# Patient Record
Sex: Female | Born: 1945
Health system: Southern US, Community
[De-identification: ages and names within clinical notes are randomized; demographics above are authoritative.]

## PROBLEM LIST (undated history)

## (undated) DIAGNOSIS — M25519 Pain in unspecified shoulder: Secondary | ICD-10-CM

## (undated) DIAGNOSIS — Z7722 Contact with and (suspected) exposure to environmental tobacco smoke (acute) (chronic): Secondary | ICD-10-CM

## (undated) DIAGNOSIS — I10 Essential (primary) hypertension: Secondary | ICD-10-CM

## (undated) DIAGNOSIS — R809 Proteinuria, unspecified: Secondary | ICD-10-CM

## (undated) DIAGNOSIS — E785 Hyperlipidemia, unspecified: Secondary | ICD-10-CM

## (undated) DIAGNOSIS — R42 Dizziness and giddiness: Secondary | ICD-10-CM

## (undated) DIAGNOSIS — E876 Hypokalemia: Secondary | ICD-10-CM

## (undated) DIAGNOSIS — D649 Anemia, unspecified: Secondary | ICD-10-CM

## (undated) HISTORY — DX: Anemia, unspecified: D64.9

## (undated) HISTORY — DX: Dizziness and giddiness: R42

## (undated) HISTORY — DX: Proteinuria, unspecified: R80.9

## (undated) HISTORY — DX: Contact with and (suspected) exposure to environmental tobacco smoke (acute) (chronic): Z77.22

## (undated) HISTORY — DX: Hypercalcemia: E83.52

## (undated) HISTORY — DX: Essential (primary) hypertension: I10

## (undated) HISTORY — DX: Hyperlipidemia, unspecified: E78.5

## (undated) HISTORY — DX: Hypokalemia: E87.6

## (undated) HISTORY — DX: Pain in unspecified shoulder: M25.519

---

## 1998-04-29 ENCOUNTER — Encounter: Admission: RE | Admit: 1998-04-29 | Discharge: 1998-04-29 | Payer: Self-pay | Admitting: Internal Medicine

## 1998-05-14 ENCOUNTER — Encounter: Admission: RE | Admit: 1998-05-14 | Discharge: 1998-05-14 | Payer: Self-pay | Admitting: Internal Medicine

## 1998-08-16 ENCOUNTER — Ambulatory Visit (HOSPITAL_COMMUNITY): Admission: RE | Admit: 1998-08-16 | Discharge: 1998-08-16 | Payer: Self-pay | Admitting: Internal Medicine

## 1998-08-16 ENCOUNTER — Encounter: Admission: RE | Admit: 1998-08-16 | Discharge: 1998-08-16 | Payer: Self-pay | Admitting: Internal Medicine

## 1998-09-18 ENCOUNTER — Ambulatory Visit (HOSPITAL_COMMUNITY): Admission: RE | Admit: 1998-09-18 | Discharge: 1998-09-18 | Payer: Self-pay | Admitting: Internal Medicine

## 1998-09-24 ENCOUNTER — Encounter: Payer: Self-pay | Admitting: Internal Medicine

## 1998-09-24 ENCOUNTER — Ambulatory Visit: Admission: RE | Admit: 1998-09-24 | Discharge: 1998-09-24 | Payer: Self-pay | Admitting: Internal Medicine

## 1998-10-15 ENCOUNTER — Encounter: Admission: RE | Admit: 1998-10-15 | Discharge: 1998-10-15 | Payer: Self-pay | Admitting: Internal Medicine

## 1999-07-01 ENCOUNTER — Encounter: Admission: RE | Admit: 1999-07-01 | Discharge: 1999-07-01 | Payer: Self-pay | Admitting: Internal Medicine

## 1999-10-10 ENCOUNTER — Emergency Department (HOSPITAL_COMMUNITY): Admission: EM | Admit: 1999-10-10 | Discharge: 1999-10-10 | Payer: Self-pay | Admitting: Emergency Medicine

## 1999-11-06 ENCOUNTER — Encounter: Admission: RE | Admit: 1999-11-06 | Discharge: 1999-11-06 | Payer: Self-pay | Admitting: Internal Medicine

## 1999-11-06 ENCOUNTER — Encounter: Payer: Self-pay | Admitting: Internal Medicine

## 2000-03-02 ENCOUNTER — Encounter: Admission: RE | Admit: 2000-03-02 | Discharge: 2000-03-02 | Payer: Self-pay | Admitting: Internal Medicine

## 2000-09-07 ENCOUNTER — Encounter: Admission: RE | Admit: 2000-09-07 | Discharge: 2000-09-07 | Payer: Self-pay | Admitting: Internal Medicine

## 2000-11-15 ENCOUNTER — Ambulatory Visit (HOSPITAL_COMMUNITY): Admission: RE | Admit: 2000-11-15 | Discharge: 2000-11-15 | Payer: Self-pay | Admitting: Internal Medicine

## 2000-11-15 ENCOUNTER — Encounter: Payer: Self-pay | Admitting: Internal Medicine

## 2001-05-17 ENCOUNTER — Encounter: Admission: RE | Admit: 2001-05-17 | Discharge: 2001-05-17 | Payer: Self-pay | Admitting: Internal Medicine

## 2002-07-25 ENCOUNTER — Encounter: Admission: RE | Admit: 2002-07-25 | Discharge: 2002-07-25 | Payer: Self-pay | Admitting: Internal Medicine

## 2002-08-02 ENCOUNTER — Encounter: Payer: Self-pay | Admitting: Internal Medicine

## 2002-08-02 ENCOUNTER — Encounter: Admission: RE | Admit: 2002-08-02 | Discharge: 2002-08-02 | Payer: Self-pay | Admitting: Internal Medicine

## 2003-01-30 ENCOUNTER — Encounter: Admission: RE | Admit: 2003-01-30 | Discharge: 2003-01-30 | Payer: Self-pay | Admitting: Internal Medicine

## 2003-01-31 ENCOUNTER — Encounter: Admission: RE | Admit: 2003-01-31 | Discharge: 2003-01-31 | Payer: Self-pay | Admitting: Internal Medicine

## 2004-04-29 ENCOUNTER — Encounter: Admission: RE | Admit: 2004-04-29 | Discharge: 2004-04-29 | Payer: Self-pay | Admitting: Internal Medicine

## 2004-08-12 ENCOUNTER — Ambulatory Visit: Payer: Self-pay | Admitting: Internal Medicine

## 2004-08-18 ENCOUNTER — Encounter: Admission: RE | Admit: 2004-08-18 | Discharge: 2004-08-18 | Payer: Self-pay | Admitting: Internal Medicine

## 2004-08-19 ENCOUNTER — Ambulatory Visit: Payer: Self-pay | Admitting: Internal Medicine

## 2004-09-02 ENCOUNTER — Ambulatory Visit: Payer: Self-pay | Admitting: Internal Medicine

## 2004-09-11 ENCOUNTER — Ambulatory Visit: Payer: Self-pay | Admitting: Internal Medicine

## 2004-12-20 ENCOUNTER — Emergency Department (HOSPITAL_COMMUNITY): Admission: EM | Admit: 2004-12-20 | Discharge: 2004-12-20 | Payer: Self-pay | Admitting: Emergency Medicine

## 2005-02-26 ENCOUNTER — Ambulatory Visit: Payer: Self-pay | Admitting: Internal Medicine

## 2005-05-05 ENCOUNTER — Ambulatory Visit: Payer: Self-pay | Admitting: Internal Medicine

## 2005-12-01 ENCOUNTER — Ambulatory Visit: Payer: Self-pay | Admitting: Internal Medicine

## 2005-12-18 ENCOUNTER — Encounter: Admission: RE | Admit: 2005-12-18 | Discharge: 2005-12-18 | Payer: Self-pay | Admitting: Sports Medicine

## 2005-12-18 ENCOUNTER — Encounter (INDEPENDENT_AMBULATORY_CARE_PROVIDER_SITE_OTHER): Payer: Self-pay | Admitting: Internal Medicine

## 2005-12-23 ENCOUNTER — Ambulatory Visit: Payer: Self-pay | Admitting: Internal Medicine

## 2006-06-11 ENCOUNTER — Emergency Department (HOSPITAL_COMMUNITY): Admission: EM | Admit: 2006-06-11 | Discharge: 2006-06-11 | Payer: Self-pay | Admitting: Emergency Medicine

## 2006-11-02 ENCOUNTER — Encounter (INDEPENDENT_AMBULATORY_CARE_PROVIDER_SITE_OTHER): Payer: Self-pay | Admitting: Internal Medicine

## 2006-11-02 ENCOUNTER — Ambulatory Visit: Payer: Self-pay | Admitting: Internal Medicine

## 2006-11-02 LAB — CONVERTED CEMR LAB
Chloride: 101 meq/L (ref 96–112)
Cholesterol: 204 mg/dL — ABNORMAL HIGH (ref 0–200)
HDL: 61 mg/dL (ref >39)
Hemoglobin: 11.5 g/dL — ABNORMAL LOW (ref 11.7–14.8)
MCHC: 31.3 g/dL — ABNORMAL LOW (ref 33.1–35.4)
MCV: 85.2 fL (ref 78.8–100.0)
Magnesium: 1.8 mg/dL (ref 1.5–2.5)
RBC: 4.31 M/uL (ref 3.79–4.96)
Sodium: 140 meq/L (ref 135–145)
Total Bilirubin: 0.3 mg/dL (ref 0.3–1.2)
Total CHOL/HDL Ratio: 3.3
Triglycerides: 59 mg/dL (ref <150)
VLDL: 12 mg/dL (ref 0–40)
WBC: 5.3 10*3/uL (ref 3.7–10.0)

## 2006-11-06 DIAGNOSIS — E785 Hyperlipidemia, unspecified: Secondary | ICD-10-CM

## 2006-11-06 DIAGNOSIS — D649 Anemia, unspecified: Secondary | ICD-10-CM

## 2006-11-06 DIAGNOSIS — M25519 Pain in unspecified shoulder: Secondary | ICD-10-CM

## 2006-11-06 DIAGNOSIS — I10 Essential (primary) hypertension: Secondary | ICD-10-CM

## 2006-11-06 DIAGNOSIS — E876 Hypokalemia: Secondary | ICD-10-CM

## 2006-11-06 DIAGNOSIS — Z9189 Other specified personal risk factors, not elsewhere classified: Secondary | ICD-10-CM | POA: Insufficient documentation

## 2006-11-06 DIAGNOSIS — R809 Proteinuria, unspecified: Secondary | ICD-10-CM | POA: Insufficient documentation

## 2006-11-06 HISTORY — DX: Hypercalcemia: E83.52

## 2006-11-08 ENCOUNTER — Ambulatory Visit: Payer: Self-pay | Admitting: Internal Medicine

## 2007-07-27 ENCOUNTER — Telehealth: Payer: Self-pay | Admitting: *Deleted

## 2007-08-09 ENCOUNTER — Ambulatory Visit: Payer: Self-pay | Admitting: Internal Medicine

## 2007-08-09 LAB — CONVERTED CEMR LAB
CO2: 28 meq/L (ref 19–32)
HCT: 38.6 % (ref 36.0–46.0)
Hemoglobin: 12.1 g/dL (ref 12.0–15.0)
MCHC: 31.3 g/dL (ref 30.0–36.0)
MCV: 84.3 fL (ref 78.0–100.0)
Potassium: 3.9 meq/L (ref 3.5–5.3)
RBC: 4.58 M/uL (ref 3.87–5.11)
RDW: 15.4 % — ABNORMAL HIGH (ref 11.5–14.0)
WBC: 5.2 10*3/uL (ref 4.0–10.5)

## 2007-08-23 ENCOUNTER — Ambulatory Visit: Payer: Self-pay | Admitting: Internal Medicine

## 2007-12-05 ENCOUNTER — Encounter (INDEPENDENT_AMBULATORY_CARE_PROVIDER_SITE_OTHER): Payer: Self-pay | Admitting: Internal Medicine

## 2007-12-05 ENCOUNTER — Ambulatory Visit: Payer: Self-pay | Admitting: Internal Medicine

## 2007-12-05 LAB — CONVERTED CEMR LAB

## 2008-03-14 ENCOUNTER — Encounter (INDEPENDENT_AMBULATORY_CARE_PROVIDER_SITE_OTHER): Payer: Self-pay | Admitting: Internal Medicine

## 2008-08-31 ENCOUNTER — Telehealth: Payer: Self-pay

## 2008-09-05 ENCOUNTER — Encounter (INDEPENDENT_AMBULATORY_CARE_PROVIDER_SITE_OTHER): Payer: Self-pay | Admitting: Internal Medicine

## 2008-09-05 ENCOUNTER — Ambulatory Visit: Payer: Self-pay | Admitting: Internal Medicine

## 2008-09-05 LAB — CONVERTED CEMR LAB
BUN: 13 mg/dL (ref 6–23)
Chloride: 105 meq/L (ref 96–112)
Creatinine, Ser: 0.79 mg/dL (ref 0.40–1.20)

## 2008-11-01 ENCOUNTER — Ambulatory Visit: Payer: Self-pay | Admitting: Internal Medicine

## 2008-11-02 LAB — CONVERTED CEMR LAB
Basophils Relative: 1 % (ref 0–1)
Eosinophils Relative: 3 % (ref 0–5)
HDL: 52 mg/dL (ref >39)
Hemoglobin: 12 g/dL (ref 12.0–15.0)
LDL Cholesterol: 147 mg/dL — ABNORMAL HIGH (ref 0–99)
Lymphocytes Relative: 45 % (ref 12–46)
Lymphs Abs: 2.5 10*3/uL (ref 0.7–4.0)
MCHC: 31.3 g/dL (ref 30.0–36.0)
Monocytes Absolute: 0.6 10*3/uL (ref 0.1–1.0)
Neutrophils Relative %: 40 % — ABNORMAL LOW (ref 43–77)
Platelets: 241 10*3/uL (ref 150–400)
RBC: 4.69 M/uL (ref 3.87–5.11)
RDW: 15.3 % (ref 11.5–15.5)
VLDL: 16 mg/dL (ref 0–40)
WBC: 5.6 10*3/uL (ref 4.0–10.5)

## 2008-11-06 ENCOUNTER — Encounter: Admission: RE | Admit: 2008-11-06 | Discharge: 2008-11-06 | Payer: Self-pay | Admitting: Internal Medicine

## 2008-11-06 LAB — HM MAMMOGRAPHY

## 2008-11-19 ENCOUNTER — Ambulatory Visit: Payer: Self-pay | Admitting: Internal Medicine

## 2008-11-19 ENCOUNTER — Emergency Department (HOSPITAL_COMMUNITY): Admission: EM | Admit: 2008-11-19 | Discharge: 2008-11-19 | Payer: Self-pay | Admitting: Emergency Medicine

## 2008-11-19 LAB — CONVERTED CEMR LAB
ALT: 15 units/L (ref 0–35)
Bilirubin, Direct: 0.1 mg/dL (ref 0.0–0.3)
Total Bilirubin: 0.7 mg/dL (ref 0.3–1.2)
Total Protein: 8 g/dL (ref 6.0–8.3)

## 2008-11-21 ENCOUNTER — Encounter (INDEPENDENT_AMBULATORY_CARE_PROVIDER_SITE_OTHER): Payer: Self-pay | Admitting: Internal Medicine

## 2008-11-21 ENCOUNTER — Telehealth (INDEPENDENT_AMBULATORY_CARE_PROVIDER_SITE_OTHER): Payer: Self-pay | Admitting: Internal Medicine

## 2009-01-25 ENCOUNTER — Encounter (INDEPENDENT_AMBULATORY_CARE_PROVIDER_SITE_OTHER): Payer: Self-pay | Admitting: Internal Medicine

## 2009-02-05 ENCOUNTER — Encounter: Admission: RE | Admit: 2009-02-05 | Discharge: 2009-04-24 | Payer: Self-pay | Admitting: Orthopaedic Surgery

## 2009-02-14 ENCOUNTER — Encounter (INDEPENDENT_AMBULATORY_CARE_PROVIDER_SITE_OTHER): Payer: Self-pay | Admitting: Internal Medicine

## 2009-03-14 ENCOUNTER — Encounter (INDEPENDENT_AMBULATORY_CARE_PROVIDER_SITE_OTHER): Payer: Self-pay | Admitting: Internal Medicine

## 2009-06-20 ENCOUNTER — Encounter (INDEPENDENT_AMBULATORY_CARE_PROVIDER_SITE_OTHER): Payer: Self-pay | Admitting: Internal Medicine

## 2009-06-20 ENCOUNTER — Ambulatory Visit: Payer: Self-pay | Admitting: Internal Medicine

## 2009-06-20 LAB — HM PAP SMEAR: HM Pap smear: NEGATIVE

## 2009-06-21 DIAGNOSIS — N189 Chronic kidney disease, unspecified: Secondary | ICD-10-CM

## 2009-06-21 LAB — CONVERTED CEMR LAB
BUN: 20 mg/dL (ref 6–23)
CO2: 25 meq/L (ref 19–32)
Chloride: 101 meq/L (ref 96–112)
HCT: 34.9 % — ABNORMAL LOW (ref 36.0–46.0)
MCV: 84.1 fL (ref >=78.0)
Platelets: 190 10*3/uL (ref 150–400)
Potassium: 4 meq/L (ref 3.5–5.3)
RBC: 4.15 M/uL (ref 3.87–5.11)
Sodium: 139 meq/L (ref 135–145)

## 2009-06-24 ENCOUNTER — Ambulatory Visit: Payer: Self-pay | Admitting: Internal Medicine

## 2009-06-24 LAB — CONVERTED CEMR LAB
Chloride: 102 meq/L (ref 96–112)
Creatinine, Ser: 1.35 mg/dL — ABNORMAL HIGH (ref 0.40–1.20)
Sodium: 140 meq/L (ref 135–145)

## 2010-02-19 ENCOUNTER — Telehealth (INDEPENDENT_AMBULATORY_CARE_PROVIDER_SITE_OTHER): Payer: Self-pay | Admitting: Dermatology

## 2010-02-25 ENCOUNTER — Ambulatory Visit: Payer: Self-pay | Admitting: Internal Medicine

## 2010-02-25 ENCOUNTER — Encounter (INDEPENDENT_AMBULATORY_CARE_PROVIDER_SITE_OTHER): Payer: Self-pay | Admitting: Dermatology

## 2010-02-25 LAB — CONVERTED CEMR LAB
CO2: 30 meq/L (ref 19–32)
Calcium: 9.9 mg/dL (ref 8.4–10.5)
Chloride: 100 meq/L (ref 96–112)
Glucose, Bld: 131 mg/dL — ABNORMAL HIGH (ref 70–99)
Potassium: 3.5 meq/L (ref 3.5–5.3)
Sodium: 136 meq/L (ref 135–145)

## 2010-02-28 ENCOUNTER — Ambulatory Visit: Payer: Self-pay | Admitting: Internal Medicine

## 2010-02-28 DIAGNOSIS — E119 Type 2 diabetes mellitus without complications: Secondary | ICD-10-CM

## 2010-02-28 DIAGNOSIS — E1121 Type 2 diabetes mellitus with diabetic nephropathy: Secondary | ICD-10-CM | POA: Insufficient documentation

## 2010-02-28 LAB — CONVERTED CEMR LAB
Ferritin: 278 ng/mL (ref 10–291)
HCT: 35 % — ABNORMAL LOW (ref 36.0–46.0)
HDL: 54 mg/dL (ref >39)
Hgb A1c MFr Bld: 6.6 %
LDL Cholesterol: 133 mg/dL — ABNORMAL HIGH (ref 0–99)
RBC: 4.19 M/uL (ref 3.87–5.11)
Total CHOL/HDL Ratio: 3.8
VLDL: 17 mg/dL (ref 0–40)
WBC: 6 10*3/uL (ref 4.0–10.5)

## 2010-03-12 ENCOUNTER — Telehealth: Payer: Self-pay | Admitting: *Deleted

## 2010-03-26 ENCOUNTER — Telehealth (INDEPENDENT_AMBULATORY_CARE_PROVIDER_SITE_OTHER): Payer: Self-pay | Admitting: Dermatology

## 2010-04-25 ENCOUNTER — Telehealth (INDEPENDENT_AMBULATORY_CARE_PROVIDER_SITE_OTHER): Payer: Self-pay | Admitting: Dermatology

## 2010-04-29 ENCOUNTER — Telehealth: Payer: Self-pay | Admitting: *Deleted

## 2010-06-03 ENCOUNTER — Telehealth: Payer: Self-pay | Admitting: *Deleted

## 2010-09-09 ENCOUNTER — Telehealth (INDEPENDENT_AMBULATORY_CARE_PROVIDER_SITE_OTHER): Payer: Self-pay | Admitting: *Deleted

## 2010-11-24 ENCOUNTER — Telehealth (INDEPENDENT_AMBULATORY_CARE_PROVIDER_SITE_OTHER): Payer: Self-pay | Admitting: *Deleted

## 2010-12-07 ENCOUNTER — Encounter: Payer: Self-pay | Admitting: Internal Medicine

## 2010-12-08 ENCOUNTER — Encounter: Payer: Self-pay | Admitting: Internal Medicine

## 2010-12-16 NOTE — Progress Notes (Signed)
Summary: refill/ hla  Phone Note Refill Request Message from:  Fax from Pharmacy on September 09, 2010 11:49 AM  Refills Requested: Medication #1:  LISINOPRIL 20 MG TABS Take 1 tablet by mouth once a day   Dosage confirmed as above?Dosage Confirmed   Last Refilled: 9/24 last visit and labs 02/2010  Initial call taken by: Marin Roberts RN,  September 09, 2010 11:50 AM  Follow-up for Phone Call        Rx faxed to pharmacy Follow-up by: Mariea Stable MD,  September 09, 2010 12:05 PM    Prescriptions: LISINOPRIL 20 MG TABS (LISINOPRIL) Take 1 tablet by mouth once a day  #30 x 5   Entered and Authorized by:   Mariea Stable MD   Signed by:   Mariea Stable MD on 09/09/2010   Method used:   Electronically to        [facility] [phone]* (retail)       [address]       [place], [place]  [address]       Ph: [phone]       Fax: [phone]   RxID:   [id number]

## 2010-12-16 NOTE — Progress Notes (Signed)
Summary: change triam/hctz/ hla  Phone Note From Pharmacy   Summary of Call: message from pharm, the triam/ hctz 75/50 is on back order, they do have 37.5/25...shall we do  2 of these to equal the 75/50? please advise Initial call taken by: Marin Roberts RN,  April 29, 2010 3:19 PM  Follow-up for Phone Call        yes, that is fine. please let me know if a separate prescription needs to be sent. Follow-up by: Aris Lot MD,  April 29, 2010 7:24 PM  Additional Follow-up for Phone Call Additional follow up Details #1::        Pharmacist called Additional Follow-up by: Marin Roberts RN,  May 08, 2010 5:17 PM

## 2010-12-16 NOTE — Progress Notes (Signed)
Summary: asa/ hla  Phone Note Call from Patient   Summary of Call: pt called to ask if wms lo dose asa was ok to take like md said, i told her after speaking w/ dr Doreen Beam that as long as it is 81mg  she will be fine taking that one. dr Doreen Beam will add to her med list and make note of their conversation Initial call taken by: Marin Roberts RN,  March 12, 2010 4:57 PM

## 2010-12-16 NOTE — Miscellaneous (Signed)
Summary: Orders Update  Clinical Lists Changes  Medications: Rx of LISINOPRIL 20 MG TABS (LISINOPRIL) Take 1 tablet by mouth once a day;  #30 x 5;  Signed;  Entered by: Ulyess Mort MD;  Authorized by: Ulyess Mort MD;  Method used: [id number] [address], [place], [place], [place]  [address], Ph: [phone], Fax: [phone] Orders: Added new Test order of T-Basic Metabolic Panel [id number]) - Signed    Prescriptions: LISINOPRIL 20 MG TABS (LISINOPRIL) Take 1 tablet by mouth once a day  #30 x 5   Entered and Authorized by:   Ulyess Mort MD   Signed by:   Ulyess Mort MD on 02/25/2010   Method used:   [facility]        [facility]  [address] [phone][facility] (retail)       [address]       [place], [place]  [address]       Ph: [phone]       Fax: [phone]   RxID:   [id number]   Process Orders Check Orders Results:     [facility]: ABN not required for this insurance Order queued for requisitioning for [facility]: February 25, 2010 9:27 AM  Tests Sent for requisitioning (February 25, 2010 9:27 AM):     02/25/2010: [facility] -- T-Basic Metabolic Panel [id number] (signed)

## 2010-12-16 NOTE — Assessment & Plan Note (Signed)
Summary: checkup per dr wjitworth/pcp-Lorrain Rivers/hla   Vital Signs:  Patient profile:   65 year old female Height:      64 inches (162.56 cm) Weight:      183.9 pounds (83.59 kg) BMI:     31.68 Temp:     98.0 degrees F oral BP sitting:   110 / 66  (right arm)  Vitals Entered By: Chinita Pester RN (February 28, 2010 3:02 PM) CC: Check-up; medication refills. Is Patient Diabetic? No Pain Assessment Patient in pain? no      Nutritional Status BMI of > 30 = obese  Have you ever been in a relationship where you felt threatened, hurt or afraid?No   Does patient need assistance? Functional Status Self care Ambulation Normal   Primary Care Provider:  Aris Lot MD  CC:  Check-up; medication refills..  History of Present Illness: 65 yo woman with hx HTN, hyperlipidemia, renal insufficiency, and anemia who was previously a patient of Dr. Harvie Junior who presents for regular followup. I am this patient's PCP but this is our first time to meet:  HTN: 111/72 last visit. Has been taking meds.   Renal Insuff: In 06/2009 this patient had a Creatinine of 1.31 and had a repeat of 1.35 a few days later. No further workup was pursued. Her baseline seems to be  ~0.7-1.0 based on readings over the previous 3 years. When she called in for a refill of lisinopril I brought her in today to make sure we could repeat her Bmet as this was never followed up. However, this was repeated a few days ago by Dr. Aundria Rud and was back to normal, so no Bmet needed today.   Hyperlipidemia: Per chart documentation the patient was on a statin in the past but had side effects, stopped taking it, and refused other statins. Ate stake biscuit this AM. Says she does not want to be on a statin.   Normocystic anemia: At last office visit Hb was 11.1. Dr. Harvie Junior documented that the patient had refused colonoscopy and was to return hemoccult cards. She also documented that her hemoccult test in the office was negative. She checked  ferritin which was WNL at 270. The patient tells me that she still refuses colonoscopy. She does deny any red blood or black stools.   Preventive Health: PAP was negative 06/2009. Mammo was negative 10/2008 with recommendation for 1 year fu. She says that she has the reminder letter at home and will call to make her mammo appointment.  It is documented that colonoscopy was refused in the past and she says that she still refuses today. She refuses flu and tetanus vaccines today.    Depression History:      The patient denies a depressed mood most of the day and a diminished interest in her usual daily activities.         Preventive Screening-Counseling & Management  Alcohol-Tobacco     Alcohol drinks/day: 0     Smoking Status: never  Caffeine-Diet-Exercise     Does Patient Exercise: yes     Type of exercise: walking  Problems Prior to Update: 1)  Renal Insufficiency, Acute  (ICD-585.9) 2)  Encounter For Long-term Use of Other Medications  (ICD-V58.69) 3)  Other Screening Mammogram  (ICD-V76.12) 4)  Routine Gynecological Examination  (ICD-V72.31) 5)  Shoulder Pain, Bilateral  (ICD-719.41) 6)  Hx, Personal, Health Hazards Nec  (ICD-V15.89) 7)  Hypokalemia  (ICD-276.8) 8)  Proteinuria  (ICD-791.0) 9)  Hypercalcemia  (ICD-275.42) 10)  Hypertension  (ICD-401.9) 11)  Hyperlipidemia  (ICD-272.4) 12)  Anemia-nos  (ICD-285.9)  Current Medications (verified): 1)  Lisinopril 20 Mg Tabs (Lisinopril) .... Take 1 Tablet By Mouth Once A Day 2)  Dyazide 37.5-25 Mg Caps (Triamterene-Hctz) .... Take 1 Tablet By Mouth Once A Day  Allergies (verified): No Known Drug Allergies  Social History: Does Patient Exercise:  yes  Review of Systems  The patient denies anorexia, fever, weight loss, weight gain, vision loss, decreased hearing, hoarseness, chest pain, syncope, dyspnea on exertion, peripheral edema, prolonged cough, headaches, hemoptysis, abdominal pain, melena, hematochezia, severe  indigestion/heartburn, hematuria, incontinence, genital sores, muscle weakness, suspicious skin lesions, transient blindness, difficulty walking, depression, unusual weight change, abnormal bleeding, enlarged lymph nodes, and angioedema.         see hpi  Physical Exam  General:  alert, well-developed, well-nourished, and well-hydrated.   Head:  normocephalic and atraumatic.   Eyes:  vision grossly intact, pupils equal, pupils round, and pupils reactive to light.   Ears:  no external deformities.   Nose:  no external deformity.   Neck:  supple, full ROM, and no masses.   Lungs:  normal respiratory effort, no accessory muscle use, normal breath sounds, no crackles, and no wheezes.   Heart:  normal rate, regular rhythm, no murmur, no gallop, no rub, and no JVD.   Abdomen:  soft, non-tender, and normal bowel sounds.   Neurologic:  alert & oriented X3, cranial nerves II-XII intact, strength normal in all extremities, and sensation intact to light touch.   Psych:  Oriented X3, memory intact for recent and remote, normally interactive, good eye contact, not anxious appearing, and not depressed appearing.     Impression & Recommendations:  Problem # 1:  HYPERTENSION (ICD-401.9) At goal. Bmet checked recently so do not need to check today.  Her updated medication list for this problem includes:    Lisinopril 20 Mg Tabs (Lisinopril) .Marland Kitchen... Take 1 tablet by mouth once a day    Dyazide 37.5-25 Mg Caps (Triamterene-hctz) .Marland Kitchen... Take 1 tablet by mouth once a day  Problem # 2:  RENAL INSUFFICIENCY, ACUTE (ICD-585.9) Cr had fallen at recent Bmet. No need to recheck today.  Problem # 3:  ANEMIA-NOS (ICD-285.9) Checking CBC and ferritin today. Given her hx of positive hemoccults and anemia, I think she needs a colonoscopy. She refuses this. I spent 10-15 minutes educating her about how colon polyps and colon cancer develop, how we screen for such processes, and how we can possibly detect  colon cancer  development or progression through screening. She is still adamant that she does not want a colonoscopy. I respect her right to refuse, but this should still be addressed specifically at each visit.   **Hg stable. Anemia very mild. Ferritin WNL.  Orders: T-CBC No Diff ([id number]) T-Ferritin ([id number])  Problem # 4:  HEALTH SCREENING (ICD-V70.0) Random blood glucose elevated on recent Bmet with no recent diabetes screening. Checking A1c today.   **A1c is 6.6. See below.  Orders: T-Hgb A1C (in-house) ([id number])  Problem # 5:  Preventive Health Care (ICD-V70.0)  Preventive Health: PAP was negative 06/2009. Mammo was negative 10/2008 with recommendation for 1 year fu. She says that she has the reminder letter at home and will call to make her mammo appointment.  It is documented that colonoscopy was refused in the past and she says that she still refuses today. She refuses flu and tetanus vaccines today.   Problem # 6:  HYPERLIPIDEMIA (ICD-272.4) She is  not technically fasting but has not eaten since this AM. I will check lipid panel today.  *It is noted in the record and confirmed by the patient that she does not wish to be on a statin at this time.    Orders: T-Lipid Profile ([id number])  Problem # 7:  DIABETES MELLITUS, TYPE II, CONTROLLED, MILD (ICD-250.00) After speaking with this patient on the phone, she is aware that she has a new diagnosis of type II DM. I have informed her that she is at treatment goal of less than 7 a1c and that for now she will need to focus on diet and exercise. I have asked that she meet with Jamison Neighbor for diabetes and diet education, but she says that now is now a good time since her husband is ill. Because she does not want to do anything else in terms of appointments right now, I have asked that she followup in 3 months so that we can recheck A1c and do other things that need to be done [facility] appointment, formal foot check, microalb/cr ratio, etc].  She agrees to this plan. I have also asked that she start aspirin therapy. She is already on an ACE-I.  Her updated medication list for this problem includes:    Lisinopril 20 Mg Tabs (Lisinopril) .Marland Kitchen... Take 1 tablet by mouth once a day    Aspirin 81 Mg Tbec (Aspirin) .Marland Kitchen... Take 1 tablet by mouth once a day  Complete Medication List: 1)  Lisinopril 20 Mg Tabs (Lisinopril) .... Take 1 tablet by mouth once a day 2)  Dyazide 37.5-25 Mg Caps (Triamterene-hctz) .... Take 1 tablet by mouth once a day 3)  Aspirin 81 Mg Tbec (Aspirin) .... Take 1 tablet by mouth once a day  Patient Instructions: 1)  Please make a followup appointment in 6 months or sooner as needed. 2)  Please call to make your mammogram appointment.  Prevention & Chronic Care Immunizations   Influenza vaccine: Not documented   Influenza vaccine deferral: Refused  (02/28/2010)    Tetanus booster: Not documented   Td booster deferral: Refused  (02/28/2010)    Pneumococcal vaccine: Not documented    H. zoster vaccine: Not documented  Colorectal Screening   Hemoccult: Not documented    Colonoscopy: Not documented   Colonoscopy action/deferral: Refused  (02/28/2010)  Other Screening   Pap smear: NEGATIVE FOR INTRAEPITHELIAL LESIONS OR MALIGNANCY.  (06/20/2009)    Mammogram: No specific mammographic evidence of malignancy.  Assessment: BIRADS 1. Location: [facility].     (11/06/2008)   Mammogram action/deferral: Screening mammogram in 1 year.     (11/06/2008)   Mammogram due: 12/2006    DXA bone density scan: Not documented   Smoking status: never  (02/28/2010)  Diabetes Mellitus   HgbA1C: 6.6  (02/28/2010)    Eye exam: Not documented    Foot exam: Not documented   High risk foot: Not documented   Foot care education: Not documented    Urine microalbumin/creatinine ratio: Not documented  Lipids   Total Cholesterol: 215  (11/01/2008)   LDL: 147  (11/01/2008)   LDL Direct: Not  documented   HDL: 52  (11/01/2008)   Triglycerides: 82  (11/01/2008)    SGOT (AST): 19  (11/19/2008)   SGPT (ALT): 15  (11/19/2008)   Alkaline phosphatase: 55  (11/19/2008)   Total bilirubin: 0.7  (11/19/2008)    Lipid flowsheet reviewed?: Yes   Progress toward LDL goal: Unchanged  Hypertension   Last Blood  Pressure: 110 / 66  (02/28/2010)   Serum creatinine: 1.08  (02/25/2010)   Serum potassium 3.5  (02/25/2010)    Hypertension flowsheet reviewed?: Yes   Progress toward BP goal: At goal  Self-Management Support :    Patient will work on the following items until the next clinic visit to reach self-care goals:     Medications and monitoring: take my medicines every day, bring all of my medications to every visit  (02/28/2010)     Eating: eat more vegetables, use fresh or frozen vegetables, eat foods that are low in salt, eat baked foods instead of fried foods  (02/28/2010)     Activity: take a 30 minute walk every day  (02/28/2010)    Diabetes self-management support: Resources for patients handout  (02/28/2010)    Hypertension self-management support: Education handout, Resources for patients handout, Written self-care plan  (02/28/2010)   Hypertension self-care plan printed.   Hypertension education handout printed    Lipid self-management support: Education handout, Resources for patients handout, Written self-care plan  (02/28/2010)   Lipid self-care plan printed.   Lipid education handout printed      Resource handout printed.  Process Orders Check Orders Results:     [facility]: ABN not required for this insurance Tests Sent for requisitioning (March 12, 2010 6:35 PM):     02/28/2010: [facility] -- T-Lipid Profile [id number] (signed)     02/28/2010: [facility] -- T-CBC No Diff [[id number]] (signed)     02/28/2010: [facility] -- T-Ferritin [[id number]] (signed)    Laboratory Results     Blood Tests   Date/Time Received: February 28, 2010 4:09 PM Date/Time Reported: Alric Quan  February 28, 2010 4:09 PM   HGBA1C: 6.6%   (Normal Range: Non-Diabetic - 3-6%   Control Diabetic - 6-8%)

## 2010-12-16 NOTE — Progress Notes (Signed)
Summary: Refill/gh  Phone Note Refill Request Message from:  Fax from Pharmacy on April 25, 2010 4:12 PM  Refills Requested: Medication #1:  TRIAMTERENE-HCTZ 75-50 MG TABS Take 1/2  tablet by mouth once a day.   Last Refilled: 03/26/2010  Method Requested: Electronic Initial call taken by: Angelina Ok RN,  April 25, 2010 4:13 PM  Follow-up for Phone Call        Rx faxed to pharmacy Follow-up by: Aris Lot MD,  April 26, 2010 7:02 AM    Prescriptions: TRIAMTERENE-HCTZ 75-50 MG TABS (TRIAMTERENE-HCTZ) Take 1/2  tablet by mouth once a day  #15 x 5   Entered and Authorized by:   Aris Lot MD   Signed by:   Aris Lot MD on 04/26/2010   Method used:   [facility]  [address] [id number]       [address]       [place], [place]  [address]       Ph: [phone]       Fax: [phone]   RxID:   [id number]

## 2010-12-16 NOTE — Progress Notes (Signed)
Summary: Refill/gh  Phone Note Refill Request Message from:  Fax from Pharmacy on February 19, 2010 11:55 AM  Refills Requested: Medication #1:  LISINOPRIL 20 MG TABS Take 1 tablet by mouth once a day   Last Refilled: 01/22/2010 last office visit and labs were 06/2009   Method Requested: Electronic Initial call taken by: Angelina Ok RN,  February 19, 2010 11:55 AM  Follow-up for Phone Call        Rx denied because I see that this patient was seen by Dr. Hardie Lora in August and had a worsening of her renal function that was unexplained. It was mentioned that she would be brought back for repeat Bmet but I do not see any repeat visit. This patient will need to be seen in the clinic before I will fill Lisinopril. I will send a flag to scheduling staff. Follow-up by: Aris Lot MD,  February 19, 2010 6:05 PM  Additional Follow-up for Phone Call Additional follow up Details #1::        i spoke w/ pt, scheduled appt 4/15 with you Additional Follow-up by: Marin Roberts RN,  February 20, 2010 9:23 AM

## 2010-12-16 NOTE — Progress Notes (Signed)
Summary: pharm change/ hla  Phone Note Call from Patient   Summary of Call: pt calls to say script not at her [facility] for the hct/triam, script was originally sent to [facility], this was from 6/10 the original med has been on back order to all pharm for some time, [facility]. after speaking w/ the pt today pharm of pt's choice is notified as stated prior. Initial call taken by: Marin Roberts RN,  June 03, 2010 10:06 AM

## 2010-12-16 NOTE — Progress Notes (Signed)
Summary: Refill/gh  Phone Note Refill Request Message from:  Fax from Pharmacy on Mar 26, 2010 11:59 AM  Refills Requested: Medication #1:  DYAZIDE 37.5-25 MG CAPS Take 1 tablet by mouth once a day Dyazide 37.5-25 mg capsules on back order.  Pharmacy wants to change this to Dyazide 75-50 mg 1/2 capsule daily # 15.   Method Requested: Electronic Initial call taken by: Angelina Ok RN,  Mar 26, 2010 12:02 PM  Follow-up for Phone Call        Rx faxed to pharmacy Follow-up by: Aris Lot MD,  Mar 26, 2010 12:09 PM    New/Updated Medications: TRIAMTERENE-HCTZ 75-50 MG TABS (TRIAMTERENE-HCTZ) Take 1/2  tablet by mouth once a day Prescriptions: TRIAMTERENE-HCTZ 75-50 MG TABS (TRIAMTERENE-HCTZ) Take 1/2  tablet by mouth once a day  #15 x 0   Entered and Authorized by:   Aris Lot MD   Signed by:   Aris Lot MD on 03/26/2010   Method used:   [facility] ...       [facility]  [address] [id number]       [address]       [place], [place]  [address]       Ph: [phone]       Fax: [phone]   RxID:   [id number]   Appended Document: Refill/gh [facility] [address]

## 2010-12-18 NOTE — Progress Notes (Signed)
Summary: med refill/gp  Phone Note Refill Request Message from:  Patient on November 24, 2010 10:35 AM  Refills Requested: Medication #1:  TRIAMTERENE-HCTZ 75-50 MG TABS Take 1/2  tablet by mouth once a day. Last appt 02/28/2010; has scheduled an appt. 01/14/11 w/Dr. Allena Katz.   Method Requested: Electronic Initial call taken by: Chinita Pester RN,  November 24, 2010 10:35 AM    Prescriptions: ASPIRIN 81 MG TBEC (ASPIRIN) Take 1 tablet by mouth once a day  #30 x 5   Entered and Authorized by:   Donia Guiles MD   Signed by:   Donia Guiles MD on 11/24/2010   Method used:   Electronically to        [facility] [phone]* (retail)       [address]       [place], [place]  [address]       Ph: [phone]       Fax: [phone]   RxID:   [id number] LISINOPRIL 20 MG TABS (LISINOPRIL) Take 1 tablet by mouth once a day  #30 x 5   Entered and Authorized by:   Donia Guiles MD   Signed by:   Donia Guiles MD on 11/24/2010   Method used:   Electronically to        [facility] [phone]* (retail)       [address]       [place], [place]  [address]       Ph: [phone]       Fax: [phone]   RxID:   [id number] TRIAMTERENE-HCTZ 75-50 MG TABS (TRIAMTERENE-HCTZ) Take 1/2  tablet by mouth once a day  #15 x 5   Entered and Authorized by:   Donia Guiles MD   Signed by:   Donia Guiles MD on 11/24/2010   Method used:   Electronically to        [facility] [phone]* (retail)       [address]       [place], [place]  [address]       Ph: [phone]       Fax: [phone]   RxID:   [id number]

## 2011-01-14 ENCOUNTER — Ambulatory Visit (INDEPENDENT_AMBULATORY_CARE_PROVIDER_SITE_OTHER): Payer: Medicare Other | Admitting: Internal Medicine

## 2011-01-14 ENCOUNTER — Encounter: Payer: Self-pay | Admitting: Internal Medicine

## 2011-01-14 VITALS — BP 143/80 | HR 90 | Temp 97.7°F | Ht 64.0 in | Wt 200.2 lb

## 2011-01-14 DIAGNOSIS — E785 Hyperlipidemia, unspecified: Secondary | ICD-10-CM

## 2011-01-14 DIAGNOSIS — E119 Type 2 diabetes mellitus without complications: Secondary | ICD-10-CM

## 2011-01-14 DIAGNOSIS — I10 Essential (primary) hypertension: Secondary | ICD-10-CM

## 2011-01-14 LAB — CBC
Hemoglobin: 11.6 g/dL — ABNORMAL LOW (ref 12.0–15.0)
MCV: 81.6 fL (ref 78.0–100.0)
Platelets: 233 10*3/uL (ref 150–400)
RBC: 4.46 MIL/uL (ref 3.87–5.11)
RDW: 15.3 % (ref 11.5–15.5)

## 2011-01-14 LAB — BASIC METABOLIC PANEL
CO2: 27 mEq/L (ref 19–32)
Chloride: 97 mEq/L (ref 96–112)
Creat: 1.19 mg/dL (ref 0.40–1.20)
Glucose, Bld: 120 mg/dL — ABNORMAL HIGH (ref 70–99)
Potassium: 4.4 mEq/L (ref 3.5–5.3)

## 2011-01-14 NOTE — Progress Notes (Signed)
  Subjective:    Patient ID: Sarah Branch, female    DOB: 10-14-1946, 65 y.o.   MRN: [id number]  HPI Ms Sarah Branch is  A 65 yo woman with PMH of HTN, HLD and hypokalemi, who comes to the clinic for a follow up visit. She was here the last time in April 66 2011. She feels perfectly allright and has no complaints for today. Denies any cough, chest pain, fever, headache.    Review of Systems  Constitutional: Negative.   HENT: Negative.   Eyes: Negative.   Respiratory: Negative.   Cardiovascular: Negative.   Gastrointestinal: Negative.   Genitourinary: Negative.   Musculoskeletal: Negative.   Neurological: Negative.        Objective:   Physical Exam  Constitutional: She is oriented to person, place, and time. She appears well-developed and well-nourished.  HENT:  Head: Normocephalic and atraumatic.  Eyes: Conjunctivae and EOM are normal. Pupils are equal, round, and reactive to light.  Neck: Normal range of motion. Neck supple.  Cardiovascular: Normal rate, regular rhythm and normal heart sounds.  Exam reveals no friction rub.   No murmur heard. Pulmonary/Chest: Effort normal and breath sounds normal. No respiratory distress. She has no wheezes.  Abdominal: Soft. Bowel sounds are normal.  Musculoskeletal: Normal range of motion.  Neurological: She is alert and oriented to person, place, and time. No cranial nerve deficit.  Skin: Skin is warm and dry.  Psychiatric: She has a normal mood and affect.          Assessment & Plan:

## 2011-01-14 NOTE — Assessment & Plan Note (Signed)
Will check CBC today. Also she has a Hx of guaiac positive stools in past and denied of having Colonoscopy. Tried to convince her to have one at this time, but she is still not ready- is afraid of the procedure.  Will talk with her the next visit again.

## 2011-01-14 NOTE — Assessment & Plan Note (Signed)
She was not fasting and so will do FLP during next visit and add an statin if felt needed at that time.

## 2011-01-14 NOTE — Patient Instructions (Signed)
Please make a follow up visit in 4 months. Continue to take an excellent care of yourself as you do and take all your medicines regularly.

## 2011-01-14 NOTE — Assessment & Plan Note (Signed)
Her last Hb A1c was 6.6 in 4/11. She is not on any anti-diabetic meds and also denies of having diabetes. Will repeat HbA1c today.

## 2011-01-14 NOTE — Assessment & Plan Note (Signed)
BP 143/80. She takes all her meds regularly. Will check BMP today and not change any meds for now.

## 2011-01-28 ENCOUNTER — Encounter: Payer: Self-pay | Admitting: Internal Medicine

## 2011-02-11 ENCOUNTER — Encounter: Payer: Self-pay | Admitting: Internal Medicine

## 2011-04-13 ENCOUNTER — Encounter: Payer: Self-pay | Admitting: Internal Medicine

## 2011-05-18 ENCOUNTER — Other Ambulatory Visit: Payer: Self-pay | Admitting: Internal Medicine

## 2011-06-19 ENCOUNTER — Other Ambulatory Visit: Payer: Self-pay | Admitting: Internal Medicine

## 2011-10-14 ENCOUNTER — Other Ambulatory Visit: Payer: Self-pay | Admitting: *Deleted

## 2011-10-14 MED ORDER — TRIAMTERENE-HCTZ 75-50 MG PO TABS: 0.5000 | ORAL_TABLET | Freq: Every day | ORAL | Status: DC

## 2011-10-30 ENCOUNTER — Ambulatory Visit (INDEPENDENT_AMBULATORY_CARE_PROVIDER_SITE_OTHER): Payer: Medicare Other | Admitting: Internal Medicine

## 2011-10-30 ENCOUNTER — Encounter: Payer: Self-pay | Admitting: Internal Medicine

## 2011-10-30 DIAGNOSIS — E785 Hyperlipidemia, unspecified: Secondary | ICD-10-CM

## 2011-10-30 DIAGNOSIS — R809 Proteinuria, unspecified: Secondary | ICD-10-CM

## 2011-10-30 DIAGNOSIS — J069 Acute upper respiratory infection, unspecified: Secondary | ICD-10-CM

## 2011-10-30 DIAGNOSIS — D649 Anemia, unspecified: Secondary | ICD-10-CM

## 2011-10-30 DIAGNOSIS — E119 Type 2 diabetes mellitus without complications: Secondary | ICD-10-CM

## 2011-10-30 DIAGNOSIS — I10 Essential (primary) hypertension: Secondary | ICD-10-CM

## 2011-10-30 LAB — GLUCOSE, CAPILLARY: Glucose-Capillary: 155 mg/dL — ABNORMAL HIGH (ref 70–99)

## 2011-10-30 LAB — POCT GLYCOSYLATED HEMOGLOBIN (HGB A1C): Hemoglobin A1C: 7.6

## 2011-10-30 LAB — LIPID PANEL
HDL: 47 mg/dL (ref >39)
Total CHOL/HDL Ratio: 4 Ratio
VLDL: 24 mg/dL (ref 0–40)

## 2011-10-30 LAB — CBC WITH DIFFERENTIAL/PLATELET
Basophils Absolute: 0 10*3/uL (ref 0.0–0.1)
HCT: 34.5 % — ABNORMAL LOW (ref 36.0–46.0)
Hemoglobin: 11.2 g/dL — ABNORMAL LOW (ref 12.0–15.0)
MCHC: 32.5 g/dL (ref 30.0–36.0)
Monocytes Relative: 10 % (ref 3–12)
Neutro Abs: 4.4 10*3/uL (ref 1.7–7.7)
Platelets: 250 10*3/uL (ref 150–400)
RBC: 4.06 MIL/uL (ref 3.87–5.11)
RDW: 15.6 % — ABNORMAL HIGH (ref 11.5–15.5)
WBC: 8.1 10*3/uL (ref 4.0–10.5)

## 2011-10-30 MED ORDER — LORATADINE 10 MG PO TABS: 10.0000 mg | ORAL_TABLET | Freq: Every day | ORAL | Status: DC

## 2011-10-30 MED ORDER — TRIAMTERENE-HCTZ 75-50 MG PO TABS: 0.5000 | ORAL_TABLET | Freq: Every day | ORAL | Status: DC

## 2011-10-30 MED ORDER — LISINOPRIL 20 MG PO TABS: 20.0000 mg | ORAL_TABLET | Freq: Every day | ORAL | Status: DC

## 2011-10-30 MED ORDER — METFORMIN HCL 500 MG PO TABS: 500.0000 mg | ORAL_TABLET | Freq: Two times a day (BID) | ORAL | Status: DC

## 2011-10-30 MED ORDER — ASPIRIN 81 MG PO TBEC: 81.0000 mg | DELAYED_RELEASE_TABLET | Freq: Every day | ORAL | Status: DC

## 2011-10-30 NOTE — Assessment & Plan Note (Signed)
Lab Results  Component Value Date   NA 133* 01/14/2011   K 4.4 01/14/2011   CL 97 01/14/2011   CO2 27 01/14/2011   BUN 20 01/14/2011   CREATININE 1.19 01/14/2011   CREATININE 1.08 02/25/2010    BP Readings from Last 3 Encounters:  10/30/11 129/75  01/14/11 143/80  02/28/10 110/66    Assessment: Hypertension control:  controlled  Progress toward goals:  at goal Barriers to meeting goals:  no barriers identified  Plan: Hypertension treatment:  continue current medications

## 2011-10-30 NOTE — Assessment & Plan Note (Signed)
I will check lipid panel today. We'll start her on statin as appropriate after getting the results. Explained her that I will give her call if she needs to be on statin and send a prescription to her pharmacy. She verbalized understanding.

## 2011-10-30 NOTE — Assessment & Plan Note (Signed)
Patient seems to have while URI which is resolving. She has intermittent cough during today's interview. She said that the cough is getting much better. No abnormalities physical exam- no crackles or wheezes. No erythema or exudates on oropharyngeal exam.  I will give her prescription of Claritin 10 mg daily as needed for cough and also recommend over-the-counter ibuprofen 200 mg 1-2 tablets 3 times a day as needed for sore throat.

## 2011-10-30 NOTE — Progress Notes (Signed)
  Subjective:    Patient ID: Sarah Branch, female    DOB: 05/09/1946, 65 y.o.   MRN: [id number]  HPI Sarah Branch is a pleasant 65 year old woman with past history of DM 2, hyperlipidemia, hypertension who comes to the clinic for followup visit and cough for past week.  She had her head wet last Sunday while going to church and the fan was on in the church and she started having cough and cold after that. She reports having yellowish sputum production but no fever. She is has nasal congestion and blows her nose and brings yellow stuff out. She denies any shortness of breath or chest pain, headache or vision changes. She does not have any history of allergies or asthma and also does not get periodic cough.  Of note she had diabetes mentioned in problem list- and I saw her in February 2012 and checked her hemoglobin A 1C which was 7.0- which was from 6.6 in April 2011. She is not on any diabetes medication until today. I checked her hemoglobin A1c which is 7.6. Also her LDL cholesterol was 133 in April 2011- and she's not on any statins. I did not check her lipid panel last time because she was not fasting- but will check today.  She denies any abdominal pain, nausea, vomiting or diarrhea or urinary abnormalities.    Review of Systems    as per history of present illness, all other systems reviewed and negative. Objective:   Physical Exam  Vitals: Reviewed and stable. General: NAD. HEENT: PERRL, EOMI, no scleral icterus, MMM, no erythema or exudates. Cardiac: S1, S2, RRR, no rubs, murmurs or gallops Pulm: clear to auscultation bilaterally, moving normal volumes of air Abd: soft, nontender, nondistended, BS present Ext: warm and well perfused, no pedal edema Neuro: alert and oriented X3, cranial nerves II-XII grossly intact, strength and sensation to light touch equal in bilateral upper and lower extremities       Assessment & Plan:

## 2011-10-30 NOTE — Assessment & Plan Note (Signed)
Last hemoglobin 11.6 in February 2012- which was better than before. And will recheck CBC today and make appropriate management plans as needed.

## 2011-10-30 NOTE — Patient Instructions (Addendum)
Please make a followup appointment in 3-4 months. You have diabetes-for which I will start her on metformin 500 mg 2 times a day. If you have any diarrhea after starting this give [place] a call.  I will give you a call if the lab tests shows that you have to be started on cholesterol pills. If your cough gets worse or does not get better- call the clinic to make an early appointment.  Take Claritin 10 mg one pill daily for cough and allergy. Also it ibuprofen 200 mg- 1-2 tablets 3 times a day as needed for sore throat.

## 2011-10-30 NOTE — Assessment & Plan Note (Signed)
Check urine microalbumin creatinine ratio today.

## 2011-10-30 NOTE — Assessment & Plan Note (Addendum)
Lab Results  Component Value Date   HGBA1C 7.6 10/30/2011   HGBA1C 6.6 02/28/2010   CREATININE 1.19 01/14/2011   CREATININE 1.08 02/25/2010   CHOL 204* 02/28/2010   HDL 54 02/28/2010   TRIG 86 02/28/2010    Last eye exam and foot exam: No results found for this basename: HMDIABEYEEXA, HMDIABFOOTEX    Assessment: Diabetes control: not controlled Progress toward goals: deteriorated Barriers to meeting goals: Patient was not on any antidiabetic medication.  Plan: Diabetes treatment: I start her on metformin 500 mg by mouth twice a day. I will see her back in 3 months. I will check urine microalbumin/creatinine ratio today as she had a history of proteinuria. Refer to: none Instruction/counseling given: reminded to bring medications to each visit

## 2011-10-31 LAB — COMPLETE METABOLIC PANEL WITH GFR
ALT: 31 U/L (ref 0–35)
Albumin: 4.4 g/dL (ref 3.5–5.2)
BUN: 21 mg/dL (ref 6–23)
Calcium: 10.5 mg/dL (ref 8.4–10.5)
GFR, Est African American: 55 mL/min — ABNORMAL LOW
Sodium: 135 mEq/L (ref 135–145)
Total Protein: 7.8 g/dL (ref 6.0–8.3)

## 2011-10-31 LAB — MICROALBUMIN / CREATININE URINE RATIO: Microalb, Ur: 0.5 mg/dL (ref 0.00–1.89)

## 2011-11-02 ENCOUNTER — Telehealth: Payer: Self-pay | Admitting: Dietician

## 2011-11-02 NOTE — Telephone Encounter (Signed)
Patient just started metform this past Friday and has been getting sick to her stomach. Has been taking two pills twice daily with meals. Coached patient about temporarily decreasing dose to one pill a day for 1 week, if tolerated then can increase it back to 2 pills twice daily. If not tolerated then get pill cutter and cut in half and take half of a pill twice daily with food.   Patient has CDE number to call if further problems.

## 2011-11-02 NOTE — Telephone Encounter (Signed)
Thank you for your help.  Sarah Branch

## 2012-03-14 ENCOUNTER — Encounter: Payer: Self-pay | Admitting: Internal Medicine

## 2012-03-14 ENCOUNTER — Ambulatory Visit (INDEPENDENT_AMBULATORY_CARE_PROVIDER_SITE_OTHER): Payer: Medicare Other | Admitting: Internal Medicine

## 2012-03-14 VITALS — BP 141/77 | HR 109 | Temp 98.7°F | Resp 20 | Ht 63.0 in | Wt 192.3 lb

## 2012-03-14 DIAGNOSIS — Z7982 Long term (current) use of aspirin: Secondary | ICD-10-CM

## 2012-03-14 DIAGNOSIS — Z1231 Encounter for screening mammogram for malignant neoplasm of breast: Secondary | ICD-10-CM | POA: Diagnosis not present

## 2012-03-14 DIAGNOSIS — Z1211 Encounter for screening for malignant neoplasm of colon: Secondary | ICD-10-CM | POA: Diagnosis not present

## 2012-03-14 DIAGNOSIS — I10 Essential (primary) hypertension: Secondary | ICD-10-CM | POA: Diagnosis not present

## 2012-03-14 DIAGNOSIS — E785 Hyperlipidemia, unspecified: Secondary | ICD-10-CM | POA: Diagnosis not present

## 2012-03-14 DIAGNOSIS — Z79899 Other long term (current) drug therapy: Secondary | ICD-10-CM | POA: Diagnosis not present

## 2012-03-14 DIAGNOSIS — E119 Type 2 diabetes mellitus without complications: Secondary | ICD-10-CM

## 2012-03-14 DIAGNOSIS — Z0000 Encounter for general adult medical examination without abnormal findings: Secondary | ICD-10-CM

## 2012-03-14 LAB — GLUCOSE, CAPILLARY: Glucose-Capillary: 144 mg/dL — ABNORMAL HIGH (ref 70–99)

## 2012-03-14 LAB — POCT GLYCOSYLATED HEMOGLOBIN (HGB A1C): Hemoglobin A1C: 6.9

## 2012-03-14 MED ORDER — PRAVASTATIN SODIUM 40 MG PO TABS: 40.0000 mg | ORAL_TABLET | Freq: Every day | ORAL | Status: DC

## 2012-03-14 NOTE — Assessment & Plan Note (Signed)
Lab Results  Component Value Date   HGBA1C 6.9 03/14/2012   HGBA1C 6.6 02/28/2010   CREATININE 1.20* 10/30/2011   CREATININE 1.08 02/25/2010   MICROALBUR 0.50 10/30/2011   MICRALBCREAT 9.3 10/30/2011   CHOL 189 10/30/2011   HDL 47 10/30/2011   TRIG 122 10/30/2011    Last eye exam and foot exam: No results found for this basename: HMDIABEYEEXA, HMDIABFOOTEX    Assessment: Diabetes control: controlled Progress toward goals: improved Barriers to meeting goals: no barriers identified  Plan: Diabetes treatment: continue current medications Refer to: Ophthalmologist for diabetic eye exam. Instruction/counseling given: reminded to get eye exam, reminded to bring medications to each visit, discussed foot care and discussed diet

## 2012-03-14 NOTE — Progress Notes (Signed)
  Subjective:    Patient ID: Sarah Branch, female    DOB: Jul 27, 1946, 66 y.o.   MRN: [id number]  HPI Sarah Branch is a pleasant 66 year woman with past history significant for DM 2, hypertension who comes the clinic for followup visit for diabetes. She was started on metformin in December 2012 when her A1c was 7.6. She is tolerating metformin well and also is following good diet.  Her hemoglobin A1c today is 6.9. She denies any nausea vomiting, fever, chills, headache, palpitations, chest pain, short of breath, abdominal pain, diarrhea. Her last mammogram was in 2009 which was normal. She still refuses to have any vaccinations. Does not want to have colonoscopy. Is okay with Hemoccult cards. Will refer her for eye exam today.    Review of Systems     As per history of present illness, all other systems review negative.  Objective:   Physical Exam  Constitutional: Vital signs reviewed.  Patient is a well-developed and well-nourished in no acute distress and cooperative with exam. Alert and oriented x3.  Head: Normocephalic and atraumatic Mouth: no erythema or exudates, MMM Eyes: PERRL, EOMI, conjunctivae normal, No scleral icterus.  Neck: Supple, Trachea midline normal ROM, No JVD, mass, thyromegaly, or carotid bruit present.  Cardiovascular: RRR, S1 normal, S2 normal, no MRG, pulses symmetric and intact bilaterally Pulmonary/Chest: CTAB, no wheezes, rales, or rhonchi Abdominal: Soft. Non-tender, non-distended, bowel sounds are normal, no masses, organomegaly, or guarding present.  Musculoskeletal: No joint deformities, erythema, or stiffness, ROM full and no nontender Neurological: A&O x3, Strenght is normal and symmetric bilaterally, cranial nerve II-XII are grossly intact, no focal motor deficit, sensory intact to light touch bilaterally.  Skin: Warm, dry and intact. No rash, cyanosis, or clubbing.          Assessment & Plan:

## 2012-03-14 NOTE — Assessment & Plan Note (Signed)
Lab Results  Component Value Date   NA 135 10/30/2011   K 4.4 10/30/2011   CL 97 10/30/2011   CO2 29 10/30/2011   BUN 21 10/30/2011   CREATININE 1.20* 10/30/2011   CREATININE 1.08 02/25/2010    BP Readings from Last 3 Encounters:  03/14/12 141/77  10/30/11 129/75  01/14/11 143/80    Assessment: Hypertension control:  controlled  Progress toward goals:  at goal Barriers to meeting goals:  no barriers identified  Plan: Hypertension treatment:  continue current medications

## 2012-03-14 NOTE — Patient Instructions (Addendum)
Please make a followup appointment in 3-4 months for diabetes check up.  Please get your mammogram Done. Also get your diabetes eye exam done before next visit. Also use the stool cards and mail them back to the clinic.  Start taking pravastatin 40 mg- one tablet daily- for your high cholesterol. We will recheck your lab tests when they come back during next visit. Keep taking all other medications as you are supposed to

## 2012-03-14 NOTE — Assessment & Plan Note (Signed)
Last lipid panel in December 2012 showed total cholesterol 189 and LDL cholesterol 118. Discussed the results with her and considering her diabetes her goal of LDLis less than 100. I will start her on Pravachol 40 mg daily today. I will see her back in 3-4 months and recheck CMP.

## 2012-03-14 NOTE — Assessment & Plan Note (Signed)
Patient refuses colonoscopy again. Will give Hemoccult cards and she will mail them back.

## 2012-04-13 ENCOUNTER — Other Ambulatory Visit (INDEPENDENT_AMBULATORY_CARE_PROVIDER_SITE_OTHER): Payer: Medicare Other

## 2012-04-13 ENCOUNTER — Ambulatory Visit
Admission: RE | Admit: 2012-04-13 | Discharge: 2012-04-13 | Disposition: A | Discharge status: Home / Self Care | Payer: Medicare Other | Source: Ambulatory Visit | Attending: Internal Medicine | Admitting: Internal Medicine

## 2012-04-13 DIAGNOSIS — Z7982 Long term (current) use of aspirin: Secondary | ICD-10-CM

## 2012-04-13 DIAGNOSIS — Z1231 Encounter for screening mammogram for malignant neoplasm of breast: Secondary | ICD-10-CM

## 2012-04-13 LAB — POC HEMOCCULT BLD/STL (HOME/3-CARD/SCREEN)
Card #2 Fecal Occult Blod, POC: NEGATIVE
Card #3 Fecal Occult Blood, POC: NEGATIVE
Fecal Occult Blood, POC: NEGATIVE

## 2012-04-22 DIAGNOSIS — H2510 Age-related nuclear cataract, unspecified eye: Secondary | ICD-10-CM | POA: Diagnosis not present

## 2012-04-22 DIAGNOSIS — E119 Type 2 diabetes mellitus without complications: Secondary | ICD-10-CM | POA: Diagnosis not present

## 2012-10-03 ENCOUNTER — Ambulatory Visit (INDEPENDENT_AMBULATORY_CARE_PROVIDER_SITE_OTHER): Payer: Medicare Other | Admitting: Internal Medicine

## 2012-10-03 ENCOUNTER — Encounter: Payer: Self-pay | Admitting: Internal Medicine

## 2012-10-03 VITALS — BP 132/80 | HR 88 | Temp 99.5°F | Resp 20 | Ht 63.5 in | Wt 187.2 lb

## 2012-10-03 DIAGNOSIS — I10 Essential (primary) hypertension: Secondary | ICD-10-CM | POA: Diagnosis not present

## 2012-10-03 DIAGNOSIS — E785 Hyperlipidemia, unspecified: Secondary | ICD-10-CM | POA: Diagnosis not present

## 2012-10-03 DIAGNOSIS — E119 Type 2 diabetes mellitus without complications: Secondary | ICD-10-CM

## 2012-10-03 MED ORDER — LISINOPRIL 20 MG PO TABS: 20.0000 mg | ORAL_TABLET | Freq: Every day | ORAL | Status: DC

## 2012-10-03 MED ORDER — METFORMIN HCL 500 MG PO TABS
500.0000 mg | ORAL_TABLET | Freq: Two times a day (BID) | ORAL | Status: DC
Start: 2012-10-03 — End: 2013-10-30

## 2012-10-03 MED ORDER — TRIAMTERENE-HCTZ 75-50 MG PO TABS: 0.5000 | ORAL_TABLET | Freq: Every day | ORAL | Status: DC

## 2012-10-03 MED ORDER — LORATADINE 10 MG PO TABS: 10.0000 mg | ORAL_TABLET | Freq: Every day | ORAL | Status: DC

## 2012-10-03 NOTE — Assessment & Plan Note (Signed)
Lab Results  Component Value Date   HGBA1C 6.6 10/03/2012   HGBA1C 6.6 02/28/2010   CREATININE 1.20* 10/30/2011   CREATININE 1.08 02/25/2010   MICROALBUR 0.50 10/30/2011   MICRALBCREAT 9.3 10/30/2011   CHOL 189 10/30/2011   HDL 47 10/30/2011   TRIG 122 10/30/2011    Last eye exam and foot exam: No results found for this basename: HMDIABEYEEXA, HMDIABFOOTEX    Assessment: Diabetes control: controlled Progress toward goals: at goal Barriers to meeting goals: no barriers identified  Plan: Diabetes treatment: continue current medications. Continue metformin. Last eye exam normal this year. Refer to: none Instruction/counseling given: reminded to bring medications to each visit, discussed foot care and discussed diet

## 2012-10-03 NOTE — Assessment & Plan Note (Signed)
Lab Results  Component Value Date   NA 135 10/30/2011   K 4.4 10/30/2011   CL 97 10/30/2011   CO2 29 10/30/2011   BUN 21 10/30/2011   CREATININE 1.20* 10/30/2011   CREATININE 1.08 02/25/2010    BP Readings from Last 3 Encounters:  10/03/12 132/80  03/14/12 141/77  10/30/11 129/75    Assessment: Hypertension control:  controlled  Progress toward goals:  at goal Barriers to meeting goals:  no barriers identified  Plan: Hypertension treatment:  continue current medications. Continue lisinopril, Maxzide. Refilled medications today.

## 2012-10-03 NOTE — Progress Notes (Signed)
  Subjective:    Patient ID: Sarah Branch, female    DOB: 1946/07/01, 66 y.o.   MRN: [id number]  HPI patient is a pleasant 66 year old woman with well-controlled DM 2, hypertension, hyperlipidemia and other problems as per problem list comes to the clinic for regular followup visit for diabetes and hypertension. She feels perfect fine and does not have any new symptoms since last visit. She denies any fever, chills, nausea, vomiting, abdominal pain, chest pain, short of breath, diarrhea.  Her hemoglobin A1c today is 6.6 and her blood pressure is 132/80.  She takes all her medications regularly and also try to follow a diabetic diet.  She is up-to-date with her eye exam, mammogram and had FOBT x3 in may 2013.    Review of Systems    as per history of present illness. Objective:   Physical Exam  General: NAD HEENT: PERRL, EOMI, no scleral icterus Cardiac: S1, S2, RRR, no rubs, murmurs or gallops Pulm: clear to auscultation bilaterally, moving normal volumes of air Abd: soft, nontender, nondistended, BS present Ext: warm and well perfused, no pedal edema Neuro: alert and oriented X3, cranial nerves II-XII grossly intact       Assessment & Plan:

## 2012-10-03 NOTE — Patient Instructions (Signed)
Please make a followup appointment in May 2014. Continue taking all medications regularly as you do. Call earlier if needed for any questions, concerns or appointment.  I refilled all medications for you for one year.

## 2012-10-03 NOTE — Assessment & Plan Note (Signed)
Continue Pravachol. No need for repeat lipid panel

## 2013-03-08 ENCOUNTER — Other Ambulatory Visit: Payer: Self-pay | Admitting: *Deleted

## 2013-03-08 DIAGNOSIS — E785 Hyperlipidemia, unspecified: Secondary | ICD-10-CM

## 2013-03-09 MED ORDER — PRAVASTATIN SODIUM 40 MG PO TABS: 40.0000 mg | ORAL_TABLET | Freq: Every day | ORAL | Status: DC

## 2013-04-05 ENCOUNTER — Encounter: Payer: Self-pay | Admitting: Internal Medicine

## 2013-04-05 ENCOUNTER — Ambulatory Visit (INDEPENDENT_AMBULATORY_CARE_PROVIDER_SITE_OTHER): Payer: Medicare Other | Admitting: Internal Medicine

## 2013-04-05 VITALS — BP 113/65 | HR 73 | Temp 97.5°F | Ht 60.35 in | Wt 186.0 lb

## 2013-04-05 DIAGNOSIS — Z1211 Encounter for screening for malignant neoplasm of colon: Secondary | ICD-10-CM

## 2013-04-05 DIAGNOSIS — I10 Essential (primary) hypertension: Secondary | ICD-10-CM | POA: Diagnosis not present

## 2013-04-05 DIAGNOSIS — E785 Hyperlipidemia, unspecified: Secondary | ICD-10-CM

## 2013-04-05 DIAGNOSIS — Z0000 Encounter for general adult medical examination without abnormal findings: Secondary | ICD-10-CM | POA: Diagnosis not present

## 2013-04-05 DIAGNOSIS — E119 Type 2 diabetes mellitus without complications: Secondary | ICD-10-CM | POA: Diagnosis not present

## 2013-04-05 HISTORY — DX: Encounter for screening for malignant neoplasm of colon: Z12.11

## 2013-04-05 LAB — POCT GLYCOSYLATED HEMOGLOBIN (HGB A1C): Hemoglobin A1C: 6.3

## 2013-04-05 LAB — LIPID PANEL
HDL: 54 mg/dL (ref >39)
LDL Cholesterol: 91 mg/dL (ref 0–99)
Triglycerides: 130 mg/dL (ref <150)
VLDL: 26 mg/dL (ref 0–40)

## 2013-04-05 LAB — GLUCOSE, CAPILLARY: Glucose-Capillary: 92 mg/dL (ref 70–99)

## 2013-04-05 NOTE — Assessment & Plan Note (Signed)
BP Readings from Last 3 Encounters:  04/05/13 113/65  10/03/12 132/80  03/14/12 141/77    Lab Results  Component Value Date   NA 135 10/30/2011   K 4.4 10/30/2011   CREATININE 1.20* 10/30/2011    Assessment: Blood pressure control:   good Progress toward BP goal:    at goal Comments: Good compliance.  Plan: Medications:  continue current medications, Continue lisinopril, Maxzide. Educational resources provided:   Self management tools provided:   Other plans: Continue current medications. No changes.

## 2013-04-05 NOTE — Patient Instructions (Signed)
Please make a followup appointment in 6-7 months.  Continue taking all medications regularly.  Get the mammogram done.  Please mail back the stool cards.  Call clinic for further questions or concerns.

## 2013-04-05 NOTE — Assessment & Plan Note (Signed)
Last LDL was 118 in December 2012.  - Continue Pravachol 40. - Lipid profile today.

## 2013-04-05 NOTE — Assessment & Plan Note (Signed)
Negative mammogram 04/13/2012.  Yearly mammogram ordered.

## 2013-04-05 NOTE — Assessment & Plan Note (Signed)
Lab Results  Component Value Date   HGBA1C 6.3 04/05/2013   HGBA1C 6.6 10/03/2012   HGBA1C 6.9 03/14/2012     Assessment: Diabetes control:   well-controlled Progress toward A1C goal:    at goal Comments: Good compliance with medications.  Plan: Medications:  continue current medications, Metformin 500 mg twice daily. Home glucose monitoring: Frequency:   Timing:   Instruction/counseling given: reminded to bring medications to each visit, discussed foot care and discussed diet Educational resources provided:   Self management tools provided:   Other plans: No new changes in medications. Her eye exam is due in June 2014.

## 2013-04-05 NOTE — Progress Notes (Signed)
  Subjective:    Patient ID: Sarah Branch, female    DOB: 11-12-1946, 67 y.o.   MRN: [id number]  HPI patient is a pleasant 67 year old woman with well-controlled DM 2, hypertension, hyperlipidemia and other problems as per problem list who comes to the clinic for regular followup visit.  She denies any symptoms or concerns.  She feels perfectly all right. Denies any fever, chills, nausea, vomiting, abdominal pain, chest pain, short of breath, diarrhea, headache, palpitations.    her A1c today 6.3 and blood pressure is 113/65.  She is due for yearly mammogram.    Review of Systems    As per history of present illness  Objective:   Physical Exam  General: NAD HEENT: PERRL, EOMI, no scleral icterus Cardiac: S1, S2, RRR, no rubs, murmurs or gallops Pulm: clear to auscultation bilaterally, moving normal volumes of air Abd: soft, nontender, nondistended, BS present Ext: warm and well perfused, no pedal edema Neuro: alert and oriented X3, cranial nerves II-XII grossly intact       Assessment & Plan:

## 2013-04-05 NOTE — Assessment & Plan Note (Signed)
3 Hemoccult stool cards provided.

## 2013-04-06 ENCOUNTER — Other Ambulatory Visit: Payer: Self-pay | Admitting: Internal Medicine

## 2013-04-06 NOTE — Progress Notes (Signed)
Case discussed with Dr. Patel soon after the resident saw the patient.  We reviewed the resident's history and exam and pertinent patient test results.  I agree with the assessment, diagnosis and plan of care documented in the resident's note. 

## 2013-04-19 ENCOUNTER — Ambulatory Visit
Admission: RE | Admit: 2013-04-19 | Discharge: 2013-04-19 | Disposition: A | Discharge status: Home / Self Care | Payer: Medicare Other | Source: Ambulatory Visit | Attending: Internal Medicine | Admitting: Internal Medicine

## 2013-04-19 DIAGNOSIS — Z0000 Encounter for general adult medical examination without abnormal findings: Secondary | ICD-10-CM

## 2013-04-19 DIAGNOSIS — Z1231 Encounter for screening mammogram for malignant neoplasm of breast: Secondary | ICD-10-CM | POA: Diagnosis not present

## 2013-04-26 ENCOUNTER — Encounter: Payer: Self-pay | Admitting: Dietician

## 2013-05-25 ENCOUNTER — Other Ambulatory Visit: Payer: Self-pay

## 2013-08-23 ENCOUNTER — Other Ambulatory Visit: Payer: Self-pay | Admitting: *Deleted

## 2013-08-23 MED ORDER — LORATADINE 10 MG PO TABS: 10.0000 mg | ORAL_TABLET | Freq: Every day | ORAL | Status: DC

## 2013-09-14 DIAGNOSIS — H04129 Dry eye syndrome of unspecified lacrimal gland: Secondary | ICD-10-CM | POA: Diagnosis not present

## 2013-09-14 DIAGNOSIS — E119 Type 2 diabetes mellitus without complications: Secondary | ICD-10-CM | POA: Diagnosis not present

## 2013-09-14 DIAGNOSIS — H2589 Other age-related cataract: Secondary | ICD-10-CM | POA: Diagnosis not present

## 2013-10-10 ENCOUNTER — Other Ambulatory Visit: Payer: Self-pay | Admitting: *Deleted

## 2013-10-10 DIAGNOSIS — E119 Type 2 diabetes mellitus without complications: Secondary | ICD-10-CM

## 2013-10-10 DIAGNOSIS — I10 Essential (primary) hypertension: Secondary | ICD-10-CM

## 2013-10-10 MED ORDER — LISINOPRIL 20 MG PO TABS: 20.0000 mg | ORAL_TABLET | Freq: Every day | ORAL | Status: DC

## 2013-10-16 DIAGNOSIS — H2510 Age-related nuclear cataract, unspecified eye: Secondary | ICD-10-CM | POA: Diagnosis not present

## 2013-10-16 DIAGNOSIS — H25019 Cortical age-related cataract, unspecified eye: Secondary | ICD-10-CM | POA: Diagnosis not present

## 2013-10-16 DIAGNOSIS — H2589 Other age-related cataract: Secondary | ICD-10-CM | POA: Diagnosis not present

## 2013-10-16 DIAGNOSIS — H25049 Posterior subcapsular polar age-related cataract, unspecified eye: Secondary | ICD-10-CM | POA: Diagnosis not present

## 2013-10-30 ENCOUNTER — Other Ambulatory Visit: Payer: Self-pay | Admitting: *Deleted

## 2013-10-30 DIAGNOSIS — E119 Type 2 diabetes mellitus without complications: Secondary | ICD-10-CM

## 2013-10-30 MED ORDER — METFORMIN HCL 500 MG PO TABS: 500.0000 mg | ORAL_TABLET | Freq: Two times a day (BID) | ORAL | Status: DC

## 2013-10-30 NOTE — Telephone Encounter (Signed)
Pt has scheduled appointment 1/16 with Dr Aundria Rud

## 2013-11-27 DIAGNOSIS — H2589 Other age-related cataract: Secondary | ICD-10-CM | POA: Diagnosis not present

## 2013-12-01 ENCOUNTER — Encounter: Payer: Self-pay | Admitting: Internal Medicine

## 2013-12-01 ENCOUNTER — Ambulatory Visit (INDEPENDENT_AMBULATORY_CARE_PROVIDER_SITE_OTHER): Payer: Medicare Other | Admitting: Internal Medicine

## 2013-12-01 VITALS — BP 126/76 | HR 85 | Temp 97.6°F | Ht 60.0 in | Wt 186.2 lb

## 2013-12-01 DIAGNOSIS — E785 Hyperlipidemia, unspecified: Secondary | ICD-10-CM | POA: Diagnosis not present

## 2013-12-01 DIAGNOSIS — I10 Essential (primary) hypertension: Secondary | ICD-10-CM

## 2013-12-01 DIAGNOSIS — N183 Chronic kidney disease, stage 3 unspecified: Secondary | ICD-10-CM

## 2013-12-01 DIAGNOSIS — Z0000 Encounter for general adult medical examination without abnormal findings: Secondary | ICD-10-CM

## 2013-12-01 DIAGNOSIS — E876 Hypokalemia: Secondary | ICD-10-CM

## 2013-12-01 DIAGNOSIS — E119 Type 2 diabetes mellitus without complications: Secondary | ICD-10-CM | POA: Diagnosis not present

## 2013-12-01 LAB — GLUCOSE, CAPILLARY: GLUCOSE-CAPILLARY: 124 mg/dL — AB (ref 70–99)

## 2013-12-01 LAB — HM DIABETES EYE EXAM

## 2013-12-01 LAB — POCT GLYCOSYLATED HEMOGLOBIN (HGB A1C): HEMOGLOBIN A1C: 6.3

## 2013-12-01 MED ORDER — METFORMIN HCL 500 MG PO TABS: 500.0000 mg | ORAL_TABLET | Freq: Two times a day (BID) | ORAL | Status: DC

## 2013-12-01 MED ORDER — TRIAMTERENE-HCTZ 75-50 MG PO TABS: 0.5000 | ORAL_TABLET | Freq: Every day | ORAL | Status: DC

## 2013-12-01 NOTE — Assessment & Plan Note (Addendum)
Lab Results  Component Value Date   HGBA1C 6.3 12/01/2013   HGBA1C 6.3 04/05/2013   HGBA1C 6.6 10/03/2012     Assessment: Diabetes control:  well-controlled Progress toward A1C goal:   at goal   Plan: Medications:  continue current medications- metformin 500 mg BID, refilled today Home glucose monitoring: none Instruction/counseling given: reminded to get eye exam- retinal scan today  Other plans: urine microalbumin today; gave information on diabetic diet in AVS  ADDENDUM: Urine microalbumin:creatinine ratio 6.6

## 2013-12-01 NOTE — Patient Instructions (Signed)
Keep up the good work!  Please take all of your medicines as prescribed.   We checked some blood and urine tests today.  I will call you if the results are abnormal.   We sent prescriptions for Glucophage and Maxzide to [facility].  You should have plenty of refills to last until your next visit.   Good luck with your cataract surgery on Monday!  Diabetes Meal Planning Guide The diabetes meal planning guide is a tool to help you plan your meals and snacks. It is important for people with diabetes to manage their blood glucose (sugar) levels. Choosing the right foods and the right amounts throughout your day will help control your blood glucose. Eating right can even help you improve your blood pressure and reach or maintain a healthy weight. CARBOHYDRATE COUNTING MADE EASY When you eat carbohydrates, they turn to sugar. This raises your blood glucose level. Counting carbohydrates can help you control this level so you feel better. When you plan your meals by counting carbohydrates, you can have more flexibility in what you eat and balance your medicine with your food intake. Carbohydrate counting simply means adding up the total amount of carbohydrate grams in your meals and snacks. Try to eat about the same amount at each meal. Foods with carbohydrates are listed below. Each portion below is 1 carbohydrate serving or 15 grams of carbohydrates. Ask your dietician how many grams of carbohydrates you should eat at each meal or snack. Grains and Starches  1 slice bread.   English muffin or hotdog/hamburger bun.   cup cold cereal (unsweetened).   cup cooked pasta or rice.   cup starchy vegetables (corn, potatoes, peas, beans, winter squash).  1 tortilla (6 inches).   bagel.  1 waffle or pancake (size of a CD).   cup cooked cereal.  4 to 6 small crackers. *Whole grain is recommended. Fruit  1 cup fresh unsweetened berries, melon, papaya, pineapple.  1 small fresh fruit.    banana or mango.   cup fruit juice (4 oz unsweetened).   cup canned fruit in natural juice or water.  2 tbs dried fruit.  12 to 15 grapes or cherries. Milk and Yogurt  1 cup fat-free or 1% milk.  1 cup soy milk.  6 oz light yogurt with sugar-free sweetener.  6 oz low-fat soy yogurt.  6 oz plain yogurt. Vegetables  1 cup raw or  cup cooked is counted as 0 carbohydrates or a "free" food.  If you eat 3 or more servings at 1 meal, count them as 1 carbohydrate serving. Other Carbohydrates   oz chips or pretzels.   cup ice cream or frozen yogurt.   cup sherbet or sorbet.  2 inch square cake, no frosting.  1 tbs honey, sugar, jam, jelly, or syrup.  2 small cookies.  3 squares of graham crackers.  3 cups popcorn.  6 crackers.  1 cup broth-based soup.  Count 1 cup casserole or other mixed foods as 2 carbohydrate servings.  Foods with less than 20 calories in a serving may be counted as 0 carbohydrates or a "free" food. You may want to purchase a book or computer software that lists the carbohydrate gram counts of different foods. In addition, the nutrition facts panel on the labels of the foods you eat are a good source of this information. The label will tell you how big the serving size is and the total number of carbohydrate grams you will be eating per serving.  Divide this number by 15 to obtain the number of carbohydrate servings in a portion. Remember, 1 carbohydrate serving equals 15 grams of carbohydrate. SERVING SIZES Measuring foods and serving sizes helps you make sure you are getting the right amount of food. The list below tells how big or small some common serving sizes are.  1 oz.........4 stacked dice.  3 oz........Marland Kitchen.Deck of cards.  1 tsp.......Marland Kitchen.Tip of little finger.  1 tbs......Marland Kitchen.Marland Kitchen.Thumb.  2 tbs.......Marland Kitchen.Golf ball.   cup......Marland Kitchen.Half of a fist.  1 cup.......Marland Kitchen.A fist. SAMPLE DIABETES MEAL PLAN Below is a sample meal plan that includes foods  from the grain and starches, dairy, vegetable, fruit, and meat groups. A dietician can individualize a meal plan to fit your calorie needs and tell you the number of servings needed from each food group. However, controlling the total amount of carbohydrates in your meal or snack is more important than making sure you include all of the food groups at every meal. You may interchange carbohydrate containing foods (dairy, starches, and fruits). The meal plan below is an example of a 2000 calorie diet using carbohydrate counting. This meal plan has 17 carbohydrate servings. Breakfast  1 cup oatmeal (2 carb servings).   cup light yogurt (1 carb serving).  1 cup blueberries (1 carb serving).   cup almonds. Snack  1 large apple (2 carb servings).  1 low-fat string cheese stick. Lunch  Chicken breast salad.  1 cup spinach.   cup chopped tomatoes.  2 oz chicken breast, sliced.  2 tbs low-fat [place] dressing.  12 whole-wheat crackers (2 carb servings).  12 to 15 grapes (1 carb serving).  1 cup low-fat milk (1 carb serving). Snack  1 cup carrots.   cup hummus (1 carb serving). Dinner  3 oz broiled salmon.  1 cup brown rice (3 carb servings). Snack  1  cups steamed broccoli (1 carb serving) drizzled with 1 tsp olive oil and lemon juice.  1 cup light pudding (2 carb servings). DIABETES MEAL PLANNING WORKSHEET Your dietician can use this worksheet to help you decide how many servings of foods and what types of foods are right for you.  BREAKFAST Food Group and Servings / Carb Servings Grain/Starches __________________________________ Dairy __________________________________________ Vegetable ______________________________________ Fruit ___________________________________________ Meat __________________________________________ Fat ____________________________________________ LUNCH Food Group and Servings / Carb Servings Grain/Starches  ___________________________________ Dairy ___________________________________________ Fruit ____________________________________________ Meat ___________________________________________ Fat _____________________________________________ Laural GoldenINNER Food Group and Servings / Carb Servings Grain/Starches ___________________________________ Dairy ___________________________________________ Fruit ____________________________________________ Meat ___________________________________________ Fat _____________________________________________ SNACKS Food Group and Servings / Carb Servings Grain/Starches ___________________________________ Dairy ___________________________________________ Vegetable _______________________________________ Fruit ____________________________________________ Meat ___________________________________________ Fat _____________________________________________ DAILY TOTALS Starches _________________________ Vegetable ________________________ Fruit ____________________________ Dairy ____________________________ Meat ____________________________ Fat ______________________________ Document Released: 07/30/2005 Document Revised: 01/25/2012 Document Reviewed: 06/10/2009 [employer] Patient Information 2014 [employer]

## 2013-12-01 NOTE — Assessment & Plan Note (Signed)
BP Readings from Last 3 Encounters:  12/01/13 126/76  04/05/13 113/65  10/03/12 132/80    Lab Results  Component Value Date   NA 135 10/30/2011   K 4.4 10/30/2011   CREATININE 1.20* 10/30/2011    Assessment: Blood pressure control:  at goal Progress toward BP goal:   stable Comments: Good medication compliance.   Plan: Medications:  continue current medications- lisinopril, Maxide Other plans: BMP today

## 2013-12-01 NOTE — Assessment & Plan Note (Signed)
Retinal scan today.  Declined flu shot, pneumovax, and tetanus booster.

## 2013-12-01 NOTE — Progress Notes (Signed)
Patient ID: Sarah Branch, female   DOB: 1945-12-27, 68 y.o.   MRN: [id number]   Subjective:   Patient ID: Sarah Branch female   DOB: 24-May-1946 68 y.o.   MRN: [id number]  HPI: [name] is a 68 y.o. woman with history of controlled DM2, HTN, HL who presents for routine follow-up.   Patient states that she is doing quite well.  No complaints.  Good compliance with all of her medications; requesting refills on metformin and Maxide. Negative ROS.   She is very active at home.  Her husband is at a SNF, but she goes to see him every day.  She had cataract in right eye removed last month, having left eye done on Monday.  Tolerated procedure well and seeing well out of right eye now.    Past Medical History  Diagnosis Date  . Anemia     -NOS- Positive stool guaiac cards, refuses colonoscopy  . Hyperlipidemia   . Hypertension   . Hypercalcemia     NI SPEP, UPEP, PTH  . Proteinuria   . Hypokalemia     2nd diuretics  . Exposure to second hand smoke   . Shoulder pain     Work Related   Current Outpatient Prescriptions  Medication Sig Dispense Refill  . aspirin 81 MG EC tablet Take 1 tablet (81 mg total) by mouth daily.  30 tablet  11  . lisinopril (PRINIVIL,ZESTRIL) 20 MG tablet Take 1 tablet (20 mg total) by mouth daily.  90 tablet  1  . loratadine (CLARITIN) 10 MG tablet Take 1 tablet (10 mg total) by mouth daily.  90 tablet  4  . metFORMIN (GLUCOPHAGE) 500 MG tablet Take 1 tablet (500 mg total) by mouth 2 (two) times daily with a meal.  60 tablet  6  . pravastatin (PRAVACHOL) 40 MG tablet Take 1 tablet (40 mg total) by mouth daily.  30 tablet  11  . triamterene-hydrochlorothiazide (MAXZIDE) 75-50 MG per tablet Take 0.5 tablets by mouth daily.  30 tablet  6   No current facility-administered medications for this visit.   No family history on file. History   Social History  . Marital Status: Married    Spouse Name: N/A    Number of Children: N/A  . Years of  Education: N/A   Social History Main Topics  . Smoking status: Never Smoker   . Smokeless tobacco: None     Comment: Husband smoked around for years  . Alcohol Use: No  . Drug Use: No  . Sexual Activity: None   Other Topics Concern  . None   Social History Narrative   Financial assistance application initiated - further paperwork needed per [name] 02/25/2010   Wrked as Copy, now retired.   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   No alcohol, drug or regular exercise   Never smoked, but husband has smoked around her for yrs.            Review of Systems: Review of Systems  Constitutional: Negative for fever, weight loss and malaise/fatigue.  Eyes: Negative for blurred vision.  Respiratory: Negative for cough and shortness of breath.   Cardiovascular: Negative for chest pain.  Gastrointestinal: Negative for nausea, vomiting, abdominal pain, diarrhea, constipation and blood in stool.  Genitourinary: Negative for dysuria.  Neurological: Negative for dizziness, weakness and headaches.    Objective:  Physical Exam: Filed Vitals:  12/01/13 1401  BP: 126/76  Pulse: 85  Temp: 97.6 F (36.4 C)  TempSrc: Oral  Height: 5' (1.524 m)  Weight: 186 lb 3.2 oz (84.46 kg)  SpO2: 96%   General: alert, cooperative, NAD HEENT: NCAT, vision grossly intact Neck: supple, no lymphadenopathy Lungs: clear to ascultation bilaterally, normal work of respiration, no wheezes, rales, ronchi Heart: regular rate and rhythm, no murmurs, gallops, or rubs Abdomen: soft, non-tender, non-distended, normal bowel sounds Extremities: 2+ DP/PT pulses bilaterally, no cyanosis, clubbing, or edema Neurologic: alert & oriented X3, cranial nerves II-XII intact, strength grossly intact, sensation intact to light touch  Assessment & Plan:  Patient discussed with Dr. Criselda PeachesMullen.  Please see problem-based assessment and plan.

## 2013-12-01 NOTE — Assessment & Plan Note (Signed)
LDL 91 in 03/2013. Continue Pravachol 40 mg daily.

## 2013-12-02 DIAGNOSIS — E1122 Type 2 diabetes mellitus with diabetic chronic kidney disease: Secondary | ICD-10-CM | POA: Insufficient documentation

## 2013-12-02 DIAGNOSIS — N182 Chronic kidney disease, stage 2 (mild): Secondary | ICD-10-CM

## 2013-12-02 LAB — BASIC METABOLIC PANEL WITH GFR
BUN: 22 mg/dL (ref 6–23)
CALCIUM: 10.7 mg/dL — AB (ref 8.4–10.5)
CO2: 26 mEq/L (ref 19–32)
CREATININE: 1.14 mg/dL — AB (ref 0.50–1.10)
Chloride: 100 mEq/L (ref 96–112)
GFR, EST AFRICAN AMERICAN: 57 mL/min — AB
GFR, EST NON AFRICAN AMERICAN: 50 mL/min — AB
Glucose, Bld: 111 mg/dL — ABNORMAL HIGH (ref 70–99)
Potassium: 4.3 mEq/L (ref 3.5–5.3)
Sodium: 137 mEq/L (ref 135–145)

## 2013-12-02 LAB — MICROALBUMIN / CREATININE URINE RATIO
Creatinine, Urine: 76 mg/dL
MICROALB UR: 0.5 mg/dL (ref 0.00–1.89)
Microalb Creat Ratio: 6.6 mg/g (ref 0.0–30.0)

## 2013-12-04 DIAGNOSIS — H2510 Age-related nuclear cataract, unspecified eye: Secondary | ICD-10-CM | POA: Diagnosis not present

## 2013-12-04 DIAGNOSIS — H25049 Posterior subcapsular polar age-related cataract, unspecified eye: Secondary | ICD-10-CM | POA: Diagnosis not present

## 2013-12-04 DIAGNOSIS — H25019 Cortical age-related cataract, unspecified eye: Secondary | ICD-10-CM | POA: Diagnosis not present

## 2013-12-04 DIAGNOSIS — H2589 Other age-related cataract: Secondary | ICD-10-CM | POA: Diagnosis not present

## 2013-12-06 NOTE — Progress Notes (Signed)
Case discussed with Dr. Rogers at the time of the visit.  We reviewed the resident's history and exam and pertinent patient test results.  I agree with the assessment, diagnosis, and plan of care documented in the resident's note. 

## 2014-01-25 DIAGNOSIS — Z961 Presence of intraocular lens: Secondary | ICD-10-CM | POA: Diagnosis not present

## 2014-03-05 ENCOUNTER — Other Ambulatory Visit: Payer: Self-pay | Admitting: *Deleted

## 2014-03-05 DIAGNOSIS — E785 Hyperlipidemia, unspecified: Secondary | ICD-10-CM

## 2014-03-05 MED ORDER — PRAVASTATIN SODIUM 40 MG PO TABS: 40.0000 mg | ORAL_TABLET | Freq: Every day | ORAL | Status: DC

## 2014-04-02 ENCOUNTER — Other Ambulatory Visit: Payer: Self-pay | Admitting: *Deleted

## 2014-04-02 DIAGNOSIS — E119 Type 2 diabetes mellitus without complications: Secondary | ICD-10-CM

## 2014-04-02 DIAGNOSIS — I10 Essential (primary) hypertension: Secondary | ICD-10-CM

## 2014-04-04 MED ORDER — LISINOPRIL 20 MG PO TABS
20.0000 mg | ORAL_TABLET | Freq: Every day | ORAL | Status: DC
Start: ? — End: 2014-06-26

## 2014-06-26 ENCOUNTER — Other Ambulatory Visit: Payer: Self-pay | Admitting: *Deleted

## 2014-06-26 DIAGNOSIS — E785 Hyperlipidemia, unspecified: Secondary | ICD-10-CM

## 2014-06-26 DIAGNOSIS — I10 Essential (primary) hypertension: Secondary | ICD-10-CM

## 2014-06-26 DIAGNOSIS — E119 Type 2 diabetes mellitus without complications: Secondary | ICD-10-CM

## 2014-06-26 MED ORDER — METFORMIN HCL 500 MG PO TABS: 500.0000 mg | ORAL_TABLET | Freq: Two times a day (BID) | ORAL | Status: DC

## 2014-06-26 MED ORDER — PRAVASTATIN SODIUM 40 MG PO TABS: 40.0000 mg | ORAL_TABLET | Freq: Every day | ORAL | Status: DC

## 2014-06-26 MED ORDER — LISINOPRIL 20 MG PO TABS: 20.0000 mg | ORAL_TABLET | Freq: Every day | ORAL | Status: DC

## 2014-06-26 NOTE — Telephone Encounter (Signed)
She will be at her upcoming appt, spoke w/ her in my office

## 2014-07-19 ENCOUNTER — Ambulatory Visit (INDEPENDENT_AMBULATORY_CARE_PROVIDER_SITE_OTHER): Payer: Medicare Other | Admitting: Internal Medicine

## 2014-07-19 ENCOUNTER — Encounter: Payer: Self-pay | Admitting: Internal Medicine

## 2014-07-19 VITALS — BP 125/75 | HR 74 | Temp 97.1°F | Wt 181.6 lb

## 2014-07-19 DIAGNOSIS — E785 Hyperlipidemia, unspecified: Secondary | ICD-10-CM | POA: Diagnosis not present

## 2014-07-19 DIAGNOSIS — Z1211 Encounter for screening for malignant neoplasm of colon: Secondary | ICD-10-CM | POA: Diagnosis not present

## 2014-07-19 DIAGNOSIS — E119 Type 2 diabetes mellitus without complications: Secondary | ICD-10-CM | POA: Diagnosis not present

## 2014-07-19 DIAGNOSIS — Z1239 Encounter for other screening for malignant neoplasm of breast: Secondary | ICD-10-CM

## 2014-07-19 DIAGNOSIS — Z0000 Encounter for general adult medical examination without abnormal findings: Secondary | ICD-10-CM | POA: Diagnosis not present

## 2014-07-19 DIAGNOSIS — I10 Essential (primary) hypertension: Secondary | ICD-10-CM | POA: Diagnosis not present

## 2014-07-19 LAB — LIPID PANEL
CHOL/HDL RATIO: 3.1 ratio
CHOLESTEROL: 186 mg/dL (ref 0–200)
HDL: 60 mg/dL (ref >39)
LDL Cholesterol: 98 mg/dL (ref 0–99)
Triglycerides: 139 mg/dL (ref <150)
VLDL: 28 mg/dL (ref 0–40)

## 2014-07-19 LAB — POCT GLYCOSYLATED HEMOGLOBIN (HGB A1C): Hemoglobin A1C: 6.5

## 2014-07-19 LAB — GLUCOSE, CAPILLARY: Glucose-Capillary: 110 mg/dL — ABNORMAL HIGH (ref 70–99)

## 2014-07-19 NOTE — Assessment & Plan Note (Signed)
Assessment: Patient has never had a colonoscopy. Patient denies any melena or hematochezia, weight loss or changes in bowel habits, no diarrhea or constipation. She denies any family history of colon cancer. Patient has two sisters who have both received screening colonoscopies. Patient refuses colonoscopy. She states she doesn't like the idea of it. I counseled her on the risks of not being screend, and the benefits of being screened. I explained to her the process, but she still refused.  Plan: Patient has agreed to home Fecal Occult cards. She will use them at home and mail them into the office. She still refuses colonoscopy at this time.

## 2014-07-19 NOTE — Progress Notes (Signed)
I saw and evaluated the patient.  I personally confirmed the key portions of Dr. Richardson's history and exam and reviewed pertinent patient test results.  The assessment, diagnosis, and plan were formulated together and I agree with the documentation in the resident's note. 

## 2014-07-19 NOTE — Progress Notes (Signed)
Subjective:    Patient ID: Sarah Branch, female    DOB: 1946-03-19, 68 y.o.   MRN: [id number]  HPI Ms. Mccarron is a 68 yo female with PMHx of HTN, Type II DM, and HLD who presents to the clinic for routine follow up for her Type II Diabetes Mellitus. Patient has been doing very well and denies any complaints. Patient states she has been complaint with her medications and has been taking them without any issues. She denies any hypoglycemic episodes.   Patient is due for a screening mammogram and is willing, but refuses screening colonoscopy and flu vaccine.   Review of Systems General: Denies fever, chills, fatigue, change in appetite, weight loss or weight gain and diaphoresis.  Respiratory: Denies SOB, cough, DOE, chest tightness, and wheezing.   Cardiovascular: Denies chest pain and palpitations.  Gastrointestinal: Denies nausea, vomiting, abdominal pain, diarrhea, constipation, blood in stool and abdominal distention.  Genitourinary: Denies dysuria, urgency, frequency, hematuria, suprapubic pain and flank pain. Endocrine: Denies hot or cold intolerance, polyuria, and polydipsia. Musculoskeletal: Denies myalgias, back pain, joint swelling, arthralgias and gait problem.  Skin: Denies pallor, rash and wounds.  Neurological: Denies dizziness, headaches, weakness, lightheadedness, numbness,seizures, and syncope, Psychiatric/Behavioral: Denies mood changes, confusion, nervousness, sleep disturbance and agitation.  Past Medical History  Diagnosis Date  . Anemia     -NOS- Positive stool guaiac cards, refuses colonoscopy  . Hyperlipidemia   . Hypertension   . Hypercalcemia     NI SPEP, UPEP, PTH  . Proteinuria   . Hypokalemia     2nd diuretics  . Exposure to second hand smoke   . Shoulder pain     Work Related   Current Outpatient Prescriptions on File Prior to Visit  Medication Sig Dispense Refill  . aspirin 81 MG EC tablet Take 1 tablet (81 mg total) by mouth daily.  30 tablet   11  . lisinopril (PRINIVIL,ZESTRIL) 20 MG tablet Take 1 tablet (20 mg total) by mouth daily.  90 tablet  4  . loratadine (CLARITIN) 10 MG tablet Take 1 tablet (10 mg total) by mouth daily.  90 tablet  4  . metFORMIN (GLUCOPHAGE) 500 MG tablet Take 1 tablet (500 mg total) by mouth 2 (two) times daily with a meal.  180 tablet  4  . pravastatin (PRAVACHOL) 40 MG tablet Take 1 tablet (40 mg total) by mouth daily.  90 tablet  4  . triamterene-hydrochlorothiazide (MAXZIDE) 75-50 MG per tablet Take 0.5 tablets by mouth daily.  30 tablet  6   No current facility-administered medications on file prior to visit.       Objective:   Physical Exam Filed Vitals:   07/19/14 0828  BP: 125/75  Pulse: 74  Temp: 97.1 F (36.2 C)  TempSrc: Oral  Weight: 181 lb 9.6 oz (82.373 kg)  SpO2: 98%   General: Vital signs reviewed.  Patient is well-developed and well-nourished, in no acute distress and cooperative with exam.   Cardiovascular: RRR, S1 normal, S2 normal, no murmurs, gallops, or rubs. Pulmonary/Chest: Clear to auscultation bilaterally, no wheezes, rales, or rhonchi. Abdominal: Soft, non-tender, non-distended, BS +, no masses, organomegaly, or guarding present.  Musculoskeletal: No joint deformities, erythema, or stiffness, ROM full and nontender. Extremities: No lower extremity edema bilaterally,  pulses symmetric and intact bilaterally. No cyanosis or clubbing. Foot Exam: Normal symmetric pulses. No ulcers or lesions. Normal sensation bilaterally. Skin: Warm, dry and intact. No rashes or erythema. Psychiatric: Normal mood and affect. speech and  behavior is normal. Cognition and memory are normal.      Assessment & Plan:   Please see problem based assessment and plan.

## 2014-07-19 NOTE — Assessment & Plan Note (Signed)
Lab Results  Component Value Date   HGBA1C 6.5 07/19/2014   HGBA1C 6.3 12/01/2013   HGBA1C 6.3 04/05/2013     Assessment: Diabetes control:  Well Controlled Progress toward A1C goal:   At goal Comments: Patient has been well controlled on Metformin 500 mg BID. She does not check her blood glucose at home, but does not need to. Checking blood glucose at home in well controlled, non-insulin dependent diabetics does not decrease morbidity or mortality, but does increase cost. Patient denies any hypoglycemic events, but was counseled on potential symptoms and instructed on what to do if she developed hypoglycemic like symptoms. Foot exam was normal. Last eye exam was in January 2015.  Plan: Medications:  continue current medications Home glucose monitoring: Frequency:  None Timing:  None Instruction/counseling given: reminded to bring medications to each visit, discussed foot care and discussed diet Educational resources provided: brochure Self management tools provided:   Other plans: We will continue her current regimen and have the patient follow up in 6 months.

## 2014-07-19 NOTE — Assessment & Plan Note (Signed)
Assessment: Patient is due for a screening mammogram. Her last mammogram was on 04/19/13 which was normal and recommended follow up in one year. Patient has not noticed any breast changes and denies family history of breast cancer. She denies any weight loss.  Plan: Patient has been referred for screening mammogram.

## 2014-07-19 NOTE — Patient Instructions (Signed)
General Instructions:   Thank you for bringing your medicines today. This helps [place] keep you safe from mistakes.  Type 2 Diabetes: Please continue taking your medications as prescribed. If you have any symptoms of low blood sugar (dizziness, confusion, weakness, fatigue or sweating), please consume juice or food with sugar and contact the office or go to the Emergency Department.  High Blood Pressure: Your blood pressure has been well controlled. Today is was 125/75. Please continue taking your medications.  Mammogram: We have referred you for a screening mammogram. You will be contacted about this appointment. Please let us know if you do not hear from anyone.  Colon Cancer Screening: Please use the cards we gave you to obtain a sample and mail the cards back to the office. We will contact you about any abnormal results.  Self Care Goals & Plans:  Self Care Goal 07/19/2014  Manage my medications take my medicines as prescribed; refill my medications on time  Monitor my health check my feet daily  Eat healthy foods eat foods that are low in salt; eat baked foods instead of fried foods  Be physically active find an activity I enjoy     Type 2 Diabetes Mellitus Type 2 diabetes mellitus is a long-term (chronic) disease. In type 2 diabetes:  The pancreas does not make enough of a hormone called insulin.  The cells in the body do not respond as well to the insulin that is made.  Both of the above can happen. Normally, insulin moves sugars from food into tissue cells. This gives you energy. If you have type 2 diabetes, sugars cannot be moved into tissue cells. This causes high blood sugar (hyperglycemia).  HOME CARE  Have your hemoglobin A1c level checked twice a year. The level shows if your diabetes is under control or out of control.  Test your blood sugar level every day as told by your doctor.  Check your ketone levels by testing your pee (urine) when you are sick and as  told.  Take your diabetes or insulin medicine as told by your doctor.  Never run out of insulin.  Adjust how much insulin you give yourself based on how many carbs (carbohydrates) you eat. Carbs are in many foods, such as fruits, vegetables, whole grains, and dairy products.  Have a healthy snack between every healthy meal. Have 3 meals and 3 snacks a day.  Lose weight if you are overweight.  Carry a medical alert card or wear your medical alert jewelry.  Carry a 15-gram carb snack with you at all times. Examples include:  Glucose pills, 3 or 4.  Glucose gel, 15-gram tube.  Raisins, 2 tablespoons (24 grams).  Jelly beans, 6.  Animal crackers, 8.  Regular (not diet) pop, 4 ounces (120 milliliters).  Gummy treats, 9.  Notice low blood sugar (hypoglycemia) symptoms, such as:  Shaking (tremors).  Trouble thinking clearly.  Sweating.  Faster heart rate.  Headache.  Dry mouth.  Hunger.  Crabbiness (irritability).  Being worried or tense (anxious).  Restless sleep.  A change in speech or coordination.  Confusion.  Treat low blood sugar right away. If you are alert and can swallow, follow the 15:15 rule:  Take 15-20 grams of a rapid-acting glucose or carb. This includes glucose gel, glucose pills, or 4 ounces (120 milliliters) of fruit juice, regular pop, or low-fat milk.  Check your blood sugar level 15 minutes after taking the glucose.  Take 15-20 grams more of glucose if the  repeat blood sugar level is still 70 mg/dL (milligrams/deciliter) or below.  Eat a meal or snack within 1 hour of the blood sugar levels going back to normal.  Notice early symptoms of high blood sugar, such as:  Being really thirsty or drinking a lot (polydipsia).  Peeing a lot (polyuria).  Do at least 150 minutes of physical activity a week or as told.  Split the 150 minutes of activity up during the week. Do not do 150 minutes of activity in one day.  Perform exercises,  such as weight lifting, at least 2 times a week or as told.  Spend no more than 90 minutes at one time inactive.  Adjust your insulin or food intake as needed if you start a new exercise or sport.  Follow your sick-day plan when you are not able to eat or drink as usual.  Do not smoke, chew tobacco, or use electronic cigarettes.  Women who are not pregnant should drink no more than 1 drink a day. Men should drink no more than 2 drinks a day.  Only drink alcohol with food.  Ask your doctor if alcohol is safe for you.  Tell your doctor if you drink alcohol several times during the week.  See your doctor regularly.  Schedule an eye exam soon after you are told you have diabetes. Schedule exams once every year.  Check your skin and feet every day. Check for cuts, bruises, redness, nail problems, bleeding, blisters, or sores. A doctor should do a foot exam once a year.  Brush your teeth and gums twice a day. Floss once a day. Visit your dentist regularly.  Share your diabetes plan with your workplace or school.  Stay up-to-date with shots that fight against diseases (immunizations).  Learn how to deal with stress.  Get diabetes education and support as needed.  Ask your doctor for special help if:  You need help to maintain or improve how you do things on your own.  You need help to maintain or improve the quality of your life.  You have foot or hand problems.  You have trouble cleaning yourself, dressing, eating, or doing physical activity. GET HELP IF:  You are unable to eat or drink for more than 6 hours.  You feel sick to your stomach (nauseous) or throw up (vomit) for more than 6 hours.  Your blood sugar level is over 240 mg/dL.  There is a change in mental status.  You get another serious illness.  You have watery poop (diarrhea) for more than 6 hours.  You have been sick or have had a fever for 2 or more days and are not getting better.  You have pain when  you are active. GET HELP RIGHT AWAY IF:  You have trouble breathing.  Your ketone levels are higher than your doctor says they should be. MAKE SURE YOU:  Understand these instructions.  Will watch your condition.  Will get help right away if you are not doing well or get worse. Document Released: 08/11/2008 Document Revised: 03/19/2014 Document Reviewed: 06/03/2012 [facility] Patient Information 2015 [place], [place]. This information is not intended to replace advice given to you by your health care provider. Make sure you discuss any questions you have with your health care provider.

## 2014-07-19 NOTE — Assessment & Plan Note (Signed)
Patient had a diabetic foot exam today. She refused a flu vaccine. Patient was counseled on the risks and benefits of flu vaccines, but she declined.

## 2014-07-19 NOTE — Assessment & Plan Note (Signed)
BP Readings from Last 3 Encounters:  07/19/14 125/75  12/01/13 126/76  04/05/13 113/65    Lab Results  Component Value Date   NA 137 12/01/2013   K 4.3 12/01/2013   CREATININE 1.14* 12/01/2013    Assessment: Blood pressure control:  Controlled Progress toward BP goal:   At goal Comments: Patient is well controlled on lisinopril 20 mg daily and triamterene-HCTZ 75-50 0.5 tabs daily.  Plan: Medications:  continue current medications Educational resources provided: brochure Self management tools provided:  None

## 2014-07-19 NOTE — Assessment & Plan Note (Signed)
Assessment: Patient is on pravastatin 40 mg daily at home. Last lipid profile was on 03/2013 and showed:   Ref. Range 04/05/2013 14:36  Cholesterol Latest Range: 0-200 mg/dL 161  Triglycerides Latest Range: <150 mg/dL 096  HDL Latest Range: >39 mg/dL 54  LDL (calc) Latest Range: 0-99 mg/dL 91  VLDL Latest Range: 0-40 mg/dL 26  Total CHOL/HDL Ratio No range found 3.2   Plan: We rechecked her lipid profile today and will continue her on pravastatin 40 mg daily.

## 2014-08-08 ENCOUNTER — Other Ambulatory Visit: Payer: Medicare Other

## 2014-08-08 LAB — POC HEMOCCULT BLD/STL (HOME/3-CARD/SCREEN)
Card #2 Fecal Occult Blod, POC: NEGATIVE
Card #3 Fecal Occult Blood, POC: NEGATIVE
Fecal Occult Blood, POC: NEGATIVE

## 2014-08-08 NOTE — Addendum Note (Signed)
Addended by: Bufford Spikes on: 08/08/2014 04:18 PM   Modules accepted: Orders

## 2014-08-17 ENCOUNTER — Encounter: Payer: Self-pay | Admitting: *Deleted

## 2014-10-09 DIAGNOSIS — Z961 Presence of intraocular lens: Secondary | ICD-10-CM | POA: Diagnosis not present

## 2014-10-09 DIAGNOSIS — E119 Type 2 diabetes mellitus without complications: Secondary | ICD-10-CM | POA: Diagnosis not present

## 2015-01-30 ENCOUNTER — Encounter: Payer: Self-pay | Admitting: Internal Medicine

## 2015-01-30 ENCOUNTER — Ambulatory Visit (INDEPENDENT_AMBULATORY_CARE_PROVIDER_SITE_OTHER): Payer: Medicare Other | Admitting: Internal Medicine

## 2015-01-30 VITALS — BP 109/62 | HR 64 | Temp 98.2°F | Wt 184.0 lb

## 2015-01-30 DIAGNOSIS — E785 Hyperlipidemia, unspecified: Secondary | ICD-10-CM

## 2015-01-30 DIAGNOSIS — Z1211 Encounter for screening for malignant neoplasm of colon: Secondary | ICD-10-CM | POA: Diagnosis not present

## 2015-01-30 DIAGNOSIS — I10 Essential (primary) hypertension: Secondary | ICD-10-CM

## 2015-01-30 DIAGNOSIS — E119 Type 2 diabetes mellitus without complications: Secondary | ICD-10-CM | POA: Diagnosis not present

## 2015-01-30 DIAGNOSIS — N183 Chronic kidney disease, stage 3 unspecified: Secondary | ICD-10-CM

## 2015-01-30 DIAGNOSIS — Z0000 Encounter for general adult medical examination without abnormal findings: Secondary | ICD-10-CM | POA: Diagnosis not present

## 2015-01-30 DIAGNOSIS — J302 Other seasonal allergic rhinitis: Secondary | ICD-10-CM | POA: Diagnosis not present

## 2015-01-30 DIAGNOSIS — Z1231 Encounter for screening mammogram for malignant neoplasm of breast: Secondary | ICD-10-CM

## 2015-01-30 LAB — GLUCOSE, CAPILLARY: Glucose-Capillary: 87 mg/dL (ref 70–99)

## 2015-01-30 LAB — BASIC METABOLIC PANEL WITH GFR
BUN: 21 mg/dL (ref 6–23)
CALCIUM: 10.1 mg/dL (ref 8.4–10.5)
CO2: 25 mEq/L (ref 19–32)
CREATININE: 1.16 mg/dL — AB (ref 0.50–1.10)
Chloride: 99 mEq/L (ref 96–112)
GFR, EST NON AFRICAN AMERICAN: 48 mL/min — AB
GFR, Est African American: 56 mL/min — ABNORMAL LOW
Glucose, Bld: 92 mg/dL (ref 70–99)
Potassium: 4.1 mEq/L (ref 3.5–5.3)
Sodium: 136 mEq/L (ref 135–145)

## 2015-01-30 LAB — POCT GLYCOSYLATED HEMOGLOBIN (HGB A1C): Hemoglobin A1C: 6.6

## 2015-01-30 MED ORDER — LORATADINE 10 MG PO TABS: 10.0000 mg | ORAL_TABLET | Freq: Every day | ORAL | Status: DC

## 2015-01-30 MED ORDER — TRIAMTERENE-HCTZ 75-50 MG PO TABS: 0.5000 | ORAL_TABLET | Freq: Every day | ORAL | Status: DC

## 2015-01-30 NOTE — Assessment & Plan Note (Signed)
Patient refused Prevnar vaccine.   Discuss DEXA scan at follow up visit.

## 2015-01-30 NOTE — Assessment & Plan Note (Signed)
BUN/Cr 22/1.14 in January of 2015. Given that patient is on lisinopril, we will recheck a BMET today to reassess kidney function.  Plan; -Recheck BMET

## 2015-01-30 NOTE — Assessment & Plan Note (Signed)
FOBT cards were negative in September 2015. Patient denies any blood in her stool, constipation or diarrhea.   Plan: -Repeat FOBT cards in September 2016 if patient continues to refuse colonoscopy screening

## 2015-01-30 NOTE — Assessment & Plan Note (Addendum)
Lab Results  Component Value Date   HGBA1C 6.5 07/19/2014   HGBA1C 6.3 12/01/2013   HGBA1C 6.3 04/05/2013     Assessment: Diabetes control:  At goal Progress toward A1C goal:   At goal Comments: Patient is on Metformin 500 mg BID.   Plan: Medications:  continue current medications Home glucose monitoring: Does not monitor Instruction/counseling given: discussed diet Other plans: Check BMET and urine microalbumin/creatinine ratio

## 2015-01-30 NOTE — Assessment & Plan Note (Signed)
Patient reports compliance with pravastatin 40 mg daily. She denies any associated symptoms of myalgias.  Plan: -Continue pravastatin 40 mg daily

## 2015-01-30 NOTE — Assessment & Plan Note (Signed)
Last mammogram normal in June 2014. Patient is due for repeat. She denies any unexpected weight loss or obvious breast lumps. Patient is unclear if her mother had left sided breast cancer as she states her mother had a left mastectomy, but they attributed it to "keys she kept in her bra."  -Referral back for routine bilateral mammogram screening

## 2015-01-30 NOTE — Assessment & Plan Note (Addendum)
BP Readings from Last 3 Encounters:  01/30/15 109/62  07/19/14 125/75  12/01/13 126/76    Lab Results  Component Value Date   NA 137 12/01/2013   K 4.3 12/01/2013   CREATININE 1.14* 12/01/2013    Assessment: Blood pressure control:  Well controlled Progress toward BP goal:   Below goal Comments: Patient is on lisinopril 20 mg daily, triamterene-HCTZ 75-50 mg (1/2 tab) per day.  Plan: Medications:  continue current medications Other plans: Recheck BMET

## 2015-01-30 NOTE — Progress Notes (Signed)
   Subjective:    Patient ID: Sarah Branch, female    DOB: 28-Jun-1946, 69 y.o.   MRN: [id number]  HPI Sarah Branch is a 69 yo female with PMHx of HTN, T2DM, HLD, and CKD III who presents to the clinic for follow up for her diabetes. Please see problem oriented charting for further details of visit.   Review of Systems General: Denies fatigue, change in appetite and diaphoresis.  Respiratory: Denies SOB, cough, DOE, chest tightness   Cardiovascular: Denies chest pain and palpitations.  Gastrointestinal: Denies nausea, vomiting, abdominal pain, diarrhea, constipation, blood in stool. Neurological: Denies dizziness, headaches, weakness, lightheadedness Psychiatric/Behavioral: Denies mood changes, confusion, sleep disturbance  Past Medical History  Diagnosis Date  . Anemia     -NOS- Positive stool guaiac cards, refuses colonoscopy  . Hyperlipidemia   . Hypertension   . Hypercalcemia     NI SPEP, UPEP, PTH  . Proteinuria   . Hypokalemia     2nd diuretics  . Exposure to second hand smoke   . Shoulder pain     Work Related   Outpatient Encounter Prescriptions as of 01/30/2015  Medication Sig  . aspirin 81 MG EC tablet Take 1 tablet (81 mg total) by mouth daily.  Marland Kitchen. lisinopril (PRINIVIL,ZESTRIL) 20 MG tablet Take 1 tablet (20 mg total) by mouth daily.  Marland Kitchen. loratadine (CLARITIN) 10 MG tablet Take 1 tablet (10 mg total) by mouth daily.  . metFORMIN (GLUCOPHAGE) 500 MG tablet Take 1 tablet (500 mg total) by mouth 2 (two) times daily with a meal.  . pravastatin (PRAVACHOL) 40 MG tablet Take 1 tablet (40 mg total) by mouth daily.  Marland Kitchen. triamterene-hydrochlorothiazide (MAXZIDE) 75-50 MG per tablet Take 0.5 tablets by mouth daily.      Objective:   Physical Exam Filed Vitals:   01/30/15 1326  BP: 109/62  Pulse: 64  Temp: 98.2 F (36.8 C)  TempSrc: Oral  Weight: 184 lb (83.462 kg)  SpO2: 100%   General: Vital signs reviewed.  Patient is well-developed and well-nourished, in no acute  distress and cooperative with exam.  Neck: Supple, no JVD or carotid bruit present.  Cardiovascular: RRR Pulmonary/Chest: Clear to auscultation bilaterally, no wheezes, rales, or rhonchi. Abdominal: Soft, non-tender, non-distended, BS + Extremities: No lower extremity edema bilaterally, pulses symmetric and intact bilaterally. No cyanosis or clubbing. Neurological: A&O x3, sensory intact to light touch bilaterally.  Skin: Warm, dry and intact. No rashes or erythema. Psychiatric: Normal mood and affect. Speech and behavior is normal. Cognition and memory are normal.     Assessment & Plan:   Please see problem based assessment and plan.

## 2015-01-30 NOTE — Assessment & Plan Note (Signed)
Refilled Loratadine 10 mg daily

## 2015-01-30 NOTE — Patient Instructions (Signed)
General Instructions:   Thank you for bringing your medicines today. This helps us keep you safe from mistakes.   Please continue taking all of your medications as prescribed. You are doing a great job!  Diabetes and Exercise Exercising regularly is important. It is not just about losing weight. It has many health benefits, such as:  Improving your overall fitness, flexibility, and endurance.  Increasing your bone density.  Helping with weight control.  Decreasing your body fat.  Increasing your muscle strength.  Reducing stress and tension.  Improving your overall health. People with diabetes who exercise gain additional benefits because exercise:  Reduces appetite.  Improves the body's use of blood sugar (glucose).  Helps lower or control blood glucose.  Decreases blood pressure.  Helps control blood lipids (such as cholesterol and triglycerides).  Improves the body's use of the hormone insulin by:  Increasing the body's insulin sensitivity.  Reducing the body's insulin needs.  Decreases the risk for heart disease because exercising:  Lowers cholesterol and triglycerides levels.  Increases the levels of good cholesterol (such as high-density lipoproteins [HDL]) in the body.  Lowers blood glucose levels. YOUR ACTIVITY PLAN  Choose an activity that you enjoy and set realistic goals. Your health care provider or diabetes educator can help you make an activity plan that works for you. Exercise regularly as directed by your health care provider. This includes:  Performing resistance training twice a week such as push-ups, sit-ups, lifting weights, or using resistance bands.  Performing 150 minutes of cardio exercises each week such as walking, running, or playing sports.  Staying active and spending no more than 90 minutes at one time being inactive. Even short bursts of exercise are good for you. Three 10-minute sessions spread throughout the day are just as  beneficial as a single 30-minute session. Some exercise ideas include:  Taking the dog for a walk.  Taking the stairs instead of the elevator.  Dancing to your favorite song.  Doing an exercise video.  Doing your favorite exercise with a friend. RECOMMENDATIONS FOR EXERCISING WITH TYPE 1 OR TYPE 2 DIABETES   Check your blood glucose before exercising. If blood glucose levels are greater than 240 mg/dL, check for urine ketones. Do not exercise if ketones are present.  Avoid injecting insulin into areas of the body that are going to be exercised. For example, avoid injecting insulin into:  The arms when playing tennis.  The legs when jogging.  Keep a record of:  Food intake before and after you exercise.  Expected peak times of insulin action.  Blood glucose levels before and after you exercise.  The type and amount of exercise you have done.  Review your records with your health care provider. Your health care provider will help you to develop guidelines for adjusting food intake and insulin amounts before and after exercising.  If you take insulin or oral hypoglycemic agents, watch for signs and symptoms of hypoglycemia. They include:  Dizziness.  Shaking.  Sweating.  Chills.  Confusion.  Drink plenty of water while you exercise to prevent dehydration or heat stroke. Body water is lost during exercise and must be replaced.  Talk to your health care provider before starting an exercise program to make sure it is safe for you. Remember, almost any type of activity is better than none. Document Released: 01/23/2004 Document Revised: 03/19/2014 Document Reviewed: 04/11/2013 [facility] Patient Information 2015 [place], [place]. This information is not intended to replace advice given to you by your  health care provider. Make sure you discuss any questions you have with your health care provider.

## 2015-01-31 LAB — MICROALBUMIN / CREATININE URINE RATIO
Creatinine, Urine: 114.6 mg/dL
Microalb Creat Ratio: 4.4 mg/g (ref 0.0–30.0)
Microalb, Ur: 0.5 mg/dL (ref <2.0)

## 2015-01-31 NOTE — Progress Notes (Signed)
Internal Medicine Clinic Attending Date of Visit: 01/30/2015  Case discussed with Dr. Richardson at the time of the visit.  We reviewed the resident's history and exam and pertinent patient test results.  I agree with the assessment, diagnosis, and plan of care documented in the resident's note. 

## 2015-02-06 ENCOUNTER — Ambulatory Visit
Admission: RE | Admit: 2015-02-06 | Discharge: 2015-02-06 | Disposition: A | Discharge status: Home / Self Care | Payer: Medicare Other | Source: Ambulatory Visit | Attending: Internal Medicine | Admitting: Internal Medicine

## 2015-02-06 DIAGNOSIS — Z1231 Encounter for screening mammogram for malignant neoplasm of breast: Secondary | ICD-10-CM | POA: Diagnosis not present

## 2015-03-21 ENCOUNTER — Encounter (HOSPITAL_COMMUNITY): Payer: Self-pay | Admitting: *Deleted

## 2015-03-21 ENCOUNTER — Emergency Department (HOSPITAL_COMMUNITY): Payer: Medicare Other

## 2015-03-21 ENCOUNTER — Emergency Department (HOSPITAL_COMMUNITY)
Admission: EM | Admit: 2015-03-21 | Discharge: 2015-03-21 | Disposition: A | Discharge status: Home / Self Care | Payer: Medicare Other | Attending: Emergency Medicine | Admitting: Emergency Medicine

## 2015-03-21 DIAGNOSIS — Z7982 Long term (current) use of aspirin: Secondary | ICD-10-CM | POA: Diagnosis not present

## 2015-03-21 DIAGNOSIS — R569 Unspecified convulsions: Secondary | ICD-10-CM | POA: Insufficient documentation

## 2015-03-21 DIAGNOSIS — Z79899 Other long term (current) drug therapy: Secondary | ICD-10-CM | POA: Insufficient documentation

## 2015-03-21 DIAGNOSIS — Z862 Personal history of diseases of the blood and blood-forming organs and certain disorders involving the immune mechanism: Secondary | ICD-10-CM | POA: Diagnosis not present

## 2015-03-21 DIAGNOSIS — R410 Disorientation, unspecified: Secondary | ICD-10-CM | POA: Diagnosis not present

## 2015-03-21 DIAGNOSIS — R0602 Shortness of breath: Secondary | ICD-10-CM | POA: Diagnosis not present

## 2015-03-21 DIAGNOSIS — I10 Essential (primary) hypertension: Secondary | ICD-10-CM | POA: Insufficient documentation

## 2015-03-21 DIAGNOSIS — E785 Hyperlipidemia, unspecified: Secondary | ICD-10-CM | POA: Diagnosis not present

## 2015-03-21 LAB — CBC WITH DIFFERENTIAL/PLATELET
BASOS ABS: 0.1 10*3/uL (ref 0.0–0.1)
Basophils Relative: 1 % (ref 0–1)
EOS PCT: 1 % (ref 0–5)
Eosinophils Absolute: 0.1 10*3/uL (ref 0.0–0.7)
HCT: 34.1 % — ABNORMAL LOW (ref 36.0–46.0)
Hemoglobin: 11 g/dL — ABNORMAL LOW (ref 12.0–15.0)
LYMPHS ABS: 1.5 10*3/uL (ref 0.7–4.0)
Lymphocytes Relative: 29 % (ref 12–46)
MCH: 26.3 pg (ref 26.0–34.0)
MCHC: 32.3 g/dL (ref 30.0–36.0)
MCV: 81.4 fL (ref 78.0–100.0)
Monocytes Absolute: 0.6 10*3/uL (ref 0.1–1.0)
Monocytes Relative: 11 % (ref 3–12)
NEUTROS PCT: 58 % (ref 43–77)
Neutro Abs: 2.7 10*3/uL (ref 1.7–7.7)
PLATELETS: 186 10*3/uL (ref 150–400)
RBC: 4.19 MIL/uL (ref 3.87–5.11)
RDW: 15.4 % (ref 11.5–15.5)
WBC: 5 10*3/uL (ref 4.0–10.5)

## 2015-03-21 LAB — URINE MICROSCOPIC-ADD ON

## 2015-03-21 LAB — COMPREHENSIVE METABOLIC PANEL
ALBUMIN: 3.8 g/dL (ref 3.5–5.0)
ALT: 28 U/L (ref 14–54)
AST: 42 U/L — ABNORMAL HIGH (ref 15–41)
Alkaline Phosphatase: 42 U/L (ref 38–126)
Anion gap: 9 (ref 5–15)
BILIRUBIN TOTAL: 0.7 mg/dL (ref 0.3–1.2)
BUN: 19 mg/dL (ref 6–20)
CHLORIDE: 100 mmol/L — AB (ref 101–111)
CO2: 25 mmol/L (ref 22–32)
Calcium: 10.4 mg/dL — ABNORMAL HIGH (ref 8.9–10.3)
Creatinine, Ser: 1.02 mg/dL — ABNORMAL HIGH (ref 0.44–1.00)
GFR calc Af Amer: >60 mL/min (ref >60)
GFR calc non Af Amer: 55 mL/min — ABNORMAL LOW (ref >60)
GLUCOSE: 138 mg/dL — AB (ref 70–99)
Potassium: 3.7 mmol/L (ref 3.5–5.1)
Sodium: 134 mmol/L — ABNORMAL LOW (ref 135–145)
Total Protein: 7.5 g/dL (ref 6.5–8.1)

## 2015-03-21 LAB — TROPONIN I

## 2015-03-21 LAB — URINALYSIS, ROUTINE W REFLEX MICROSCOPIC
BILIRUBIN URINE: NEGATIVE
Glucose, UA: NEGATIVE mg/dL
HGB URINE DIPSTICK: NEGATIVE
KETONES UR: NEGATIVE mg/dL
NITRITE: NEGATIVE
PH: 7.5 (ref 5.0–8.0)
Protein, ur: NEGATIVE mg/dL
SPECIFIC GRAVITY, URINE: 1.009 (ref 1.005–1.030)
Urobilinogen, UA: 1 mg/dL (ref 0.0–1.0)

## 2015-03-21 LAB — RAPID URINE DRUG SCREEN, HOSP PERFORMED
AMPHETAMINES: NOT DETECTED
Barbiturates: NOT DETECTED
Benzodiazepines: NOT DETECTED
Cocaine: NOT DETECTED
OPIATES: NOT DETECTED
Tetrahydrocannabinol: NOT DETECTED

## 2015-03-21 LAB — ETHANOL

## 2015-03-21 MED ORDER — LEVETIRACETAM 500 MG/5ML IV SOLN
500.0000 mg | Freq: Once | INTRAVENOUS | Status: AC
  Administered 2015-03-21: 500 mg via INTRAVENOUS
  Filled 2015-03-21: qty 5

## 2015-03-21 NOTE — ED Notes (Signed)
PT PLACED IN A GOWN AND HOOKED TO THE 5 LEAD, BP CUFF AND PULSE OX

## 2015-03-21 NOTE — ED Provider Notes (Signed)
CSN: [id number]     Arrival date & time 03/21/15  1329 History   First MD Initiated Contact with Patient 03/21/15 1332     Chief Complaint  Patient presents with  . Seizures    Low 5 caveat due to altered mental status. (Consider location/radiation/quality/duration/timing/severity/associated sxs/prior Treatment) Patient is a 69 y.o. female presenting with seizures. The history is provided by the patient and the EMS personnel.  Seizures  patient [place] Lasted 1-2 minutes and was generalized. Patient does not remember the event. She is still somewhat confused. Appears be postictal. There is some twitching of the right eyelid. Denies headache. States she's been doing well the last few days.  Past Medical History  Diagnosis Date  . Anemia     -NOS- Positive stool guaiac cards, refuses colonoscopy  . Hyperlipidemia   . Hypertension   . Hypercalcemia     NI SPEP, UPEP, PTH  . Proteinuria   . Hypokalemia     2nd diuretics  . Exposure to second hand smoke   . Shoulder pain     Work Related   History reviewed. No pertinent past surgical history. No family history on file. History  Substance Use Topics  . Smoking status: Never Smoker   . Smokeless tobacco: Not on file     Comment: Husband smoked around for years  . Alcohol Use: No   OB History    No data available     Review of Systems  Unable to perform ROS Neurological: Positive for seizures.      Allergies  Review of patient's allergies indicates no known allergies.  Home Medications   Prior to Admission medications   Medication Sig Start Date End Date Taking? Authorizing Provider  aspirin 81 MG EC tablet Take 1 tablet (81 mg total) by mouth daily. 10/30/11  Yes Sunday Spillersavi C Patel, MD  lisinopril (PRINIVIL,ZESTRIL) 20 MG tablet Take 1 tablet (20 mg total) by mouth daily. 06/26/14  Yes Nischal Heide SparkNarendra, MD  loratadine (CLARITIN) 10 MG tablet Take 1 tablet (10 mg total) by mouth daily. 01/30/15   Yes Alexa Dulcy FannyM Richardson, MD  metFORMIN (GLUCOPHAGE) 500 MG tablet Take 1 tablet (500 mg total) by mouth 2 (two) times daily with a meal. 06/26/14 06/26/15 Yes Nischal Narendra, MD  pravastatin (PRAVACHOL) 40 MG tablet Take 1 tablet (40 mg total) by mouth daily. 06/26/14 06/26/15 Yes Nischal Narendra, MD  triamterene-hydrochlorothiazide (MAXZIDE) 75-50 MG per tablet Take 0.5 tablets by mouth daily. 01/30/15  Yes Alexa Dulcy FannyM Richardson, MD   BP 132/78 mmHg  Pulse 75  Temp(Src) 98 F (36.7 C) (Oral)  Resp 14  Ht 5\' 4"  (1.626 m)  Wt 184 lb (83.462 kg)  BMI 31.57 kg/m2  SpO2 99%  LMP 01/14/1995 Physical Exam  Constitutional: She appears well-developed.  HENT:  Head: Atraumatic.  Eyes: EOM are normal. Pupils are equal, round, and reactive to light.  Neck: Neck supple.  Cardiovascular: Normal rate and regular rhythm.   Pulmonary/Chest: Effort normal and breath sounds normal.  Abdominal: Soft. There is no tenderness.  Musculoskeletal: She exhibits no tenderness.  Neurological: She is alert.  Patient is awake but somewhat confused. Answers questions but has difficulty recognizing her family members. Moves all extremities. Some twitching of the right eyelid. Face symmetric. Does not remember the events of what happened.  Skin: Skin is warm.    ED Course  Procedures (including critical care time) Labs Review Labs Reviewed  COMPREHENSIVE METABOLIC PANEL -  Abnormal; Notable for the following:    Sodium 134 (*)    Chloride 100 (*)    Glucose, Bld 138 (*)    Creatinine, Ser 1.02 (*)    Calcium 10.4 (*)    AST 42 (*)    GFR calc non Af Amer 55 (*)    All other components within normal limits  CBC WITH DIFFERENTIAL/PLATELET - Abnormal; Notable for the following:    Hemoglobin 11.0 (*)    HCT 34.1 (*)    All other components within normal limits  URINALYSIS, ROUTINE W REFLEX MICROSCOPIC - Abnormal; Notable for the following:    Leukocytes, UA TRACE (*)    All other components within normal  limits  URINE MICROSCOPIC-ADD ON - Abnormal; Notable for the following:    Bacteria, UA MANY (*)    All other components within normal limits  URINE CULTURE  ETHANOL  URINE RAPID DRUG SCREEN (HOSP PERFORMED)  TROPONIN I    Imaging Review Ct Head Wo Contrast  03/21/2015   CLINICAL DATA:  Seizures. No history of seizures. Initial encounter.  EXAM: CT HEAD WITHOUT CONTRAST  TECHNIQUE: Contiguous axial images were obtained from the base of the skull through the vertex without intravenous contrast.  COMPARISON:  None.  FINDINGS: No mass lesion, mass effect, midline shift, hydrocephalus, hemorrhage. No acute territorial cortical ischemia/infarct. Atrophy and white matter disease is present, likely chronic ischemic. Atrophy is age appropriate. Intracranial atherosclerosis. Mild mucosal thickening in the paranasal sinuses. Mastoid air cells clear.  IMPRESSION: 1. No acute intracranial abnormality. 2. Age-appropriate atrophy. Likely chronic ischemic white matter disease.   Electronically Signed   By: Andreas NewportGeoffrey  Lamke M.D.   On: 03/21/2015 16:00   Dg Chest Portable 1 View  03/21/2015   CLINICAL DATA:  Shortness of breath.  Weakness.  EXAM: PORTABLE CHEST - 1 VIEW  COMPARISON:  None.  FINDINGS: Mediastinum and hilar structures normal. Cardiomegaly mild pulmonary vascular prominence. Mild interstitial prominence. Mild component congestive heart failure cannot be excluded. A lung volumes with mild basilar atelectasis. No acute bony abnormality. Degenerative changes both shoulders.  IMPRESSION: 1. Cardiomegaly with mild pulmonary vascular prominence interstitial prominence suggesting mild congestive heart failure. 2. Low lung volumes with mild basilar atelectasis.   Electronically Signed   By: Maisie Fushomas  Register   On: 03/21/2015 14:19     EKG Interpretation   Date/Time:  Thursday Mar 21 2015 13:38:33 EDT Ventricular Rate:  79 PR Interval:  161 QRS Duration: 76 QT Interval:  399 QTC Calculation: 457 R Axis:    25 Text Interpretation:  Sinus rhythm Multiform ventricular premature  complexes Sinus pause Confirmed by Rubin PayorPICKERING  MD, Harrold DonathNATHAN [phone]([id number]) on  03/21/2015 2:19:57 PM      MDM   Final diagnoses:  New onset seizure    Patient with seizure. No history. Now back at her baseline. Denies any urinary symptoms. Denies headache. Urine culture sent. Twitching of the right eye is chronic per the patient. Does not appear to be a focal seizure. Will discharge home.    Benjiman CoreNathan Ania Levay, MD 03/21/15 [id number]

## 2015-03-21 NOTE — ED Notes (Signed)
Kim from phlebotomy here to get blood, will return after pt is back from CT scan.

## 2015-03-21 NOTE — ED Notes (Signed)
Pt arrives via [facility]. [place] was [place] the [employer] bus this [place] and had a witnessed event of around 1-2 minutes of tonic clonic movements. Pt has no hx of seizures. Pt was post ictal upon EMS arrival and [facility] to be [facility] Pt is confused and has delayed responses. Pt also has a constant twitching of the right eye and right side of her bottom lip. Pt states this is also not normal for the pt.

## 2015-03-21 NOTE — ED Notes (Signed)
Pt is in stable condition upon d/c from ED. Pt is escorted by staff via wheelchair.

## 2015-03-21 NOTE — Discharge Instructions (Signed)

## 2015-03-21 NOTE — ED Notes (Addendum)
Pt returned from CT. Kim called and informed for blood draw.

## 2015-03-23 LAB — URINE CULTURE: Colony Count: 40000

## 2015-08-15 ENCOUNTER — Encounter: Payer: Self-pay | Admitting: Internal Medicine

## 2015-08-15 ENCOUNTER — Ambulatory Visit (INDEPENDENT_AMBULATORY_CARE_PROVIDER_SITE_OTHER): Payer: Medicare Other | Admitting: Internal Medicine

## 2015-08-15 VITALS — BP 129/67 | HR 92 | Temp 98.2°F | Wt 194.4 lb

## 2015-08-15 DIAGNOSIS — E785 Hyperlipidemia, unspecified: Secondary | ICD-10-CM

## 2015-08-15 DIAGNOSIS — E1122 Type 2 diabetes mellitus with diabetic chronic kidney disease: Secondary | ICD-10-CM

## 2015-08-15 DIAGNOSIS — I129 Hypertensive chronic kidney disease with stage 1 through stage 4 chronic kidney disease, or unspecified chronic kidney disease: Secondary | ICD-10-CM | POA: Diagnosis not present

## 2015-08-15 DIAGNOSIS — N183 Chronic kidney disease, stage 3 unspecified: Secondary | ICD-10-CM

## 2015-08-15 DIAGNOSIS — Z1211 Encounter for screening for malignant neoplasm of colon: Secondary | ICD-10-CM

## 2015-08-15 DIAGNOSIS — E119 Type 2 diabetes mellitus without complications: Secondary | ICD-10-CM

## 2015-08-15 DIAGNOSIS — Z0000 Encounter for general adult medical examination without abnormal findings: Secondary | ICD-10-CM

## 2015-08-15 DIAGNOSIS — I10 Essential (primary) hypertension: Secondary | ICD-10-CM

## 2015-08-15 DIAGNOSIS — Z79899 Other long term (current) drug therapy: Secondary | ICD-10-CM | POA: Diagnosis not present

## 2015-08-15 LAB — GLUCOSE, CAPILLARY: Glucose-Capillary: 100 mg/dL — ABNORMAL HIGH (ref 65–99)

## 2015-08-15 LAB — POCT GLYCOSYLATED HEMOGLOBIN (HGB A1C): HEMOGLOBIN A1C: 7.2

## 2015-08-15 MED ORDER — METFORMIN HCL 500 MG PO TABS: 500.0000 mg | ORAL_TABLET | Freq: Two times a day (BID) | ORAL | Status: DC

## 2015-08-15 NOTE — Assessment & Plan Note (Addendum)
BP Readings from Last 3 Encounters:  08/15/15 129/67  03/21/15 138/74  01/30/15 109/62    Lab Results  Component Value Date   NA 134* 03/21/2015   K 3.7 03/21/2015   CREATININE 1.02* 03/21/2015    Assessment: Blood pressure control:  Controlled Progress toward BP goal:   At goal Comments: Patient is on lisinopril 20 mg daily and triamterene-HCTZ 75-50 mg 0.5 tabs QD.  Plan: Medications:  continue current medications

## 2015-08-15 NOTE — Patient Instructions (Signed)
Please make sure to cut back on your sweets and starches (breads, pasta, cereal).  Osteoporosis Osteoporosis happens when your bones become weak because of bone loss. Weak bones can break (fracture) more easily with slips or falls. You are more likely to develop osteoporosis if:  You are a woman.  You are older than 50 years.  You are white or Asian.  You are very thin.  Someone in your family has had osteoporosis.  You smoke or use nicotine. CAUSES   Smoking.  Too much drinking.  Being a weight below normal.  Not being active.  Not going outside in the sun enough.  Certain medical conditions, such as diabetes or Crohn disease.  Certain medicines, such as steroids or antiseizure medicines. TREATMENT  The goal of treatment is to strengthen bones. There are different types of medicines that help your bones. Some medicines make your bones more solid. Some medicines help to slow down how much bone you lose. Your doctor may check to see if you are getting enough calcium and vitamin D in your diet. PREVENTION   Make sure you get enough calcium and vitamin D.  Make sure you exercise often.  If you smoke, quit. MAKE SURE YOU:  Understand these instructions.  Will watch your condition.  Will get help right away if you are not doing well or get worse. Document Released: 01/25/2012 Document Reviewed: 01/25/2012 [facility] Patient Information 2015 [place], [place]. This information is not intended to replace advice given to you by your health care provider. Make sure you discuss any questions you have with your health care provider.

## 2015-08-15 NOTE — Progress Notes (Signed)
Subjective:    Patient ID: Sarah Branch, female    DOB: 1946/09/11, 69 y.o.   MRN: [id number]  HPI Sarah Branch is a 69 y.o. female with PMHx of HTN, T2DM, and CKD 3 who presents to the clinic for follow up for HTN and T2DM. Please see A&P for the status of the patient's chronic medical problems.   HTN: Patient reports it has been well controlled at home. She has been compliant with her medications. She denies any headaches, chest pain or shortness of breath.  T2DM: Patient reports compliance with her medications, but admits to eating more sweets such as [employer]ups, cake and breads. She denies polyuria, polydipsia.    Past Medical History  Diagnosis Date  . Anemia     -NOS- Positive stool guaiac cards, refuses colonoscopy  . Hyperlipidemia   . Hypertension   . Hypercalcemia     NI SPEP, UPEP, PTH  . Proteinuria   . Hypokalemia     2nd diuretics  . Exposure to second hand smoke   . Shoulder pain     Work Related    Outpatient Encounter Prescriptions as of 08/15/2015  Medication Sig  . aspirin 81 MG EC tablet Take 1 tablet (81 mg total) by mouth daily.  Marland Kitchen lisinopril (PRINIVIL,ZESTRIL) 20 MG tablet Take 1 tablet (20 mg total) by mouth daily.  Marland Kitchen loratadine (CLARITIN) 10 MG tablet Take 1 tablet (10 mg total) by mouth daily.  . metFORMIN (GLUCOPHAGE) 500 MG tablet Take 1 tablet (500 mg total) by mouth 2 (two) times daily with a meal.  . pravastatin (PRAVACHOL) 40 MG tablet Take 1 tablet (40 mg total) by mouth daily.  Marland Kitchen triamterene-hydrochlorothiazide (MAXZIDE) 75-50 MG per tablet Take 0.5 tablets by mouth daily.   No facility-administered encounter medications on file as of 08/15/2015.    No family history on file.  Social History   Social History  . Marital Status: Married    Spouse Name: N/A  . Number of Children: N/A  . Years of Education: N/A   Occupational History  . Not on file.   Social History Main Topics  . Smoking status: Never Smoker   . Smokeless  tobacco: Not on file     Comment: Husband smoked around for years  . Alcohol Use: No  . Drug Use: No  . Sexual Activity: Not on file   Other Topics Concern  . Not on file   Social History Narrative   Financial assistance application initiated - further paperwork needed per [name] 02/25/2010   Wrked as Copy, now retired.   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   No alcohol, drug or regular exercise   Never smoked, but husband has smoked around her for yrs.             Review of Systems General: Denies fever, chills, fatigue, change in appetite.  Respiratory: Denies SOB, cough, DOE.   Cardiovascular: Denies chest pain and palpitations.  Gastrointestinal: Denies abdominal pain, diarrhea, constipation, blood in stool  Endocrine: Denies polyuria, and polydipsia.  Neurological: Denies dizziness, headaches, weakness, lightheadedness, numbness    Objective:   Physical Exam Filed Vitals:   08/15/15 1325  BP: 129/67  Pulse: 92  Temp: 98.2 F (36.8 C)  TempSrc: Oral  Weight: 194 lb 6.4 oz (88.179 kg)  SpO2: 99%   General: Vital signs reviewed.  Patient is well-developed and well-nourished, in no  acute distress and cooperative with exam.  Cardiovascular: RRR, S1 normal, S2 normal, no murmurs, gallops, or rubs. Pulmonary/Chest: Clear to auscultation bilaterally, no wheezes, rales, or rhonchi. Abdominal: Soft, non-tender, non-distended, BS + Extremities: No lower extremity edema bilaterally, pulses symmetric and intact bilaterally. Neurological: A&O x3 Skin: Warm, dry and intact. No rashes or erythema. Psychiatric: Normal mood and affect. speech and behavior is normal. Cognition and memory are normal.       Assessment & Plan:   Please see problem oriented assessment and plan.

## 2015-08-15 NOTE — Assessment & Plan Note (Addendum)
Lab Results  Component Value Date   HGBA1C 7.2 08/15/2015   HGBA1C 6.6 01/30/2015   HGBA1C 6.5 07/19/2014     Assessment: Diabetes control:  Controlled Progress toward A1C goal:   Deteriorated Comments: Patient would like to stay on current Metformin regimen of 500 mg BID and focus on improving her diet.   Plan: Medications: Continue Metformin 500 mg BID Home glucose monitoring: Frequency:  None Timing:  None Instruction/counseling given: reminded to bring blood glucose meter & log to each visit, reminded to bring medications to each visit and discussed diet Other plans: Return in 3 months. Consider increasing metformin if HgbA1c continues to rise.

## 2015-08-15 NOTE — Assessment & Plan Note (Signed)
Last creatinine 1.25 Mar 2015.   Plan: -Continue to monitor  -Repeat BMET at follow up visit

## 2015-08-15 NOTE — Assessment & Plan Note (Signed)
Patient reports compliance with pravastatin 40 mg daily. ASCVD risk score recommends atorvastatin 80 mg daily; however patient is 68. She does not want to increase or change her current regimen. Last lipid panel showed:  Lipid Panel     Component Value Date/Time   CHOL 186 07/19/2014 0927   TRIG 139 07/19/2014 0927   HDL 60 07/19/2014 0927   CHOLHDL 3.1 07/19/2014 0927   VLDL 28 07/19/2014 0927   LDLCALC 98 07/19/2014 0927    Plan: -Continue pravastatin 40 mg daily

## 2015-08-15 NOTE — Assessment & Plan Note (Addendum)
Colonoscopy: Stool cards negative September 2015, repeat stool cards given Mammogram: Completed 01/2015 DEXA: Patient will think about it Flu Shot: Refused Hepatitis C: Refused

## 2015-08-18 NOTE — Progress Notes (Signed)
Internal Medicine Clinic Attending  Case discussed with Dr. Richardson soon after the resident saw the patient.  We reviewed the resident's history and exam and pertinent patient test results.  I agree with the assessment, diagnosis, and plan of care documented in the resident's note. 

## 2015-09-11 ENCOUNTER — Other Ambulatory Visit (INDEPENDENT_AMBULATORY_CARE_PROVIDER_SITE_OTHER): Payer: Medicare Other

## 2015-09-11 DIAGNOSIS — Z1211 Encounter for screening for malignant neoplasm of colon: Secondary | ICD-10-CM

## 2015-09-11 LAB — POC HEMOCCULT BLD/STL (HOME/3-CARD/SCREEN)
Card #3 Fecal Occult Blood, POC: NEGATIVE
FECAL OCCULT BLD: NEGATIVE
Fecal Occult Blood, POC: NEGATIVE

## 2015-09-16 ENCOUNTER — Other Ambulatory Visit: Payer: Self-pay | Admitting: Internal Medicine

## 2015-09-30 ENCOUNTER — Other Ambulatory Visit: Payer: Self-pay | Admitting: Internal Medicine

## 2015-10-15 DIAGNOSIS — E119 Type 2 diabetes mellitus without complications: Secondary | ICD-10-CM | POA: Diagnosis not present

## 2015-10-15 DIAGNOSIS — H26493 Other secondary cataract, bilateral: Secondary | ICD-10-CM | POA: Diagnosis not present

## 2015-10-15 DIAGNOSIS — Z961 Presence of intraocular lens: Secondary | ICD-10-CM | POA: Diagnosis not present

## 2015-10-15 LAB — HM DIABETES EYE EXAM

## 2015-10-30 ENCOUNTER — Encounter: Payer: Self-pay | Admitting: Internal Medicine

## 2015-10-30 ENCOUNTER — Ambulatory Visit (INDEPENDENT_AMBULATORY_CARE_PROVIDER_SITE_OTHER): Payer: Medicare Other | Admitting: Internal Medicine

## 2015-10-30 VITALS — BP 123/68 | HR 84 | Temp 98.4°F | Ht 63.5 in | Wt 199.8 lb

## 2015-10-30 DIAGNOSIS — Z79899 Other long term (current) drug therapy: Secondary | ICD-10-CM

## 2015-10-30 DIAGNOSIS — E1122 Type 2 diabetes mellitus with diabetic chronic kidney disease: Secondary | ICD-10-CM | POA: Diagnosis not present

## 2015-10-30 DIAGNOSIS — E119 Type 2 diabetes mellitus without complications: Secondary | ICD-10-CM | POA: Diagnosis present

## 2015-10-30 DIAGNOSIS — Z7984 Long term (current) use of oral hypoglycemic drugs: Secondary | ICD-10-CM | POA: Diagnosis not present

## 2015-10-30 DIAGNOSIS — N183 Chronic kidney disease, stage 3 unspecified: Secondary | ICD-10-CM

## 2015-10-30 DIAGNOSIS — I129 Hypertensive chronic kidney disease with stage 1 through stage 4 chronic kidney disease, or unspecified chronic kidney disease: Secondary | ICD-10-CM

## 2015-10-30 DIAGNOSIS — Z0000 Encounter for general adult medical examination without abnormal findings: Secondary | ICD-10-CM

## 2015-10-30 DIAGNOSIS — Z1211 Encounter for screening for malignant neoplasm of colon: Secondary | ICD-10-CM

## 2015-10-30 DIAGNOSIS — Z7722 Contact with and (suspected) exposure to environmental tobacco smoke (acute) (chronic): Secondary | ICD-10-CM

## 2015-10-30 DIAGNOSIS — I10 Essential (primary) hypertension: Secondary | ICD-10-CM

## 2015-10-30 LAB — GLUCOSE, CAPILLARY
Glucose-Capillary: 126 mg/dL — ABNORMAL HIGH (ref 65–99)
Glucose-Capillary: 110 mg/dL — ABNORMAL HIGH (ref 65–99)

## 2015-10-30 LAB — POCT GLYCOSYLATED HEMOGLOBIN (HGB A1C): Hemoglobin A1C: 6.8

## 2015-10-30 NOTE — Assessment & Plan Note (Signed)
Lab Results  Component Value Date   HGBA1C 6.8 10/30/2015   HGBA1C 7.2 08/15/2015   HGBA1C 6.6 01/30/2015     Assessment: Diabetes control:  Controlled <7 Progress toward A1C goal:   Improved Comments: Compliant with metformin 500 mg BID.   Plan: Medications:  continue current medications Home glucose monitoring: Frequency:  None Timing:  None Instruction/counseling given: discussed diet Other plans: Follow up in 3 months

## 2015-10-30 NOTE — Assessment & Plan Note (Addendum)
Patient has a history of chronic, fluctuating mild asymptomatic hypercalcemia. In 1997, this was noted along with a negative PTH, SPEP and UPEP. Most recent calcium was corrected at 10.8 in May 2016. She denies any symptoms of confusion, weakness, kidney stones, aches. Mild hypercalcemia is likely secondary to HCTZ.   Plan: -Check BMET -Check PTH -Likely d/t HCTZ as chronic since at least 1997, mild, fluctuant and asymptomatic  Addendum: PTH normal. Calcium mildly elevated at 10.3, corrected, 10.5. Likely secondary to HCTZ.

## 2015-10-30 NOTE — Progress Notes (Signed)
Subjective:    Patient ID: Sarah Branch, female    DOB: 1946/02/10, 69 y.o.   MRN: [id number]  HPI Sarah Branch is a 69 y.o. female with PMHx of HTN, HLD who presents to the clinic for follow up for HTN. Please see A&P for the status of the patient's chronic medical problems.   Past Medical History  Diagnosis Date  . Anemia     -NOS- Positive stool guaiac cards, refuses colonoscopy  . Hyperlipidemia   . Hypertension   . Hypercalcemia     NI SPEP, UPEP, PTH  . Proteinuria   . Hypokalemia     2nd diuretics  . Exposure to second hand smoke   . Shoulder pain     Work Related    Outpatient Encounter Prescriptions as of 10/30/2015  Medication Sig  . aspirin 81 MG EC tablet Take 1 tablet (81 mg total) by mouth daily.  Marland Kitchen lisinopril (PRINIVIL,ZESTRIL) 20 MG tablet Take 1 tablet (20 mg total) by mouth daily.  Marland Kitchen loratadine (CLARITIN) 10 MG tablet Take 1 tablet (10 mg total) by mouth daily.  . metFORMIN (GLUCOPHAGE) 500 MG tablet Take 1 tablet (500 mg total) by mouth 2 (two) times daily with a meal.  . pravastatin (PRAVACHOL) 40 MG tablet TAKE ONE TABLET BY MOUTH ONCE DAILY  . triamterene-hydrochlorothiazide (MAXZIDE) 75-50 MG per tablet Take 0.5 tablets by mouth daily.   No facility-administered encounter medications on file as of 10/30/2015.    No family history on file.  Social History   Social History  . Marital Status: Married    Spouse Name: N/A  . Number of Children: N/A  . Years of Education: N/A   Occupational History  . Not on file.   Social History Main Topics  . Smoking status: Never Smoker   . Smokeless tobacco: Not on file     Comment: Husband smoked around for years  . Alcohol Use: No  . Drug Use: No  . Sexual Activity: Not on file   Other Topics Concern  . Not on file   Social History Narrative   Financial assistance application initiated - further paperwork needed per [name] 02/25/2010   Wrked as Copy, now retired.   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   No alcohol, drug or regular exercise   Never smoked, but husband has smoked around her for yrs.            Review of Systems General: Denies fatigue, change in appetite   Respiratory: Denies SOB, cough, DOE   Cardiovascular: Denies chest pain and palpitations.  Gastrointestinal: Denies nausea, vomiting, abdominal pain, diarrhea, constipation, blood in stool.  Genitourinary: Denies kidney stones, dysuria, hematuria, and flank pain. Endocrine: Denies hot or cold intolerance, polyuria, and polydipsia. Musculoskeletal: Denies myalgias, back pain, arthralgias  Neurological: Denies dizziness, headaches, weakness, lightheadedness Psychiatric/Behavioral: Denies confusion    Objective:   Physical Exam Filed Vitals:   10/30/15 1354  BP: 123/68  Pulse: 84  Temp: 98.4 F (36.9 C)  TempSrc: Oral  Height: 5' 3.5" (1.613 m)  Weight: 199 lb 12.8 oz (90.629 kg)  SpO2: 99%   General: Vital signs reviewed.  Patient is obese, in no acute distress and cooperative with exam.  Cardiovascular: RRR, S1 normal, S2 normal Pulmonary/Chest: Clear to auscultation bilaterally, no wheezes, rales, or rhonchi. Abdominal: Soft, non-tender, non-distended, BS + Extremities: No lower extremity edema bilaterally Psychiatric: Normal  mood and affect. Speech and behavior is normal. Cognition and memory are normal.     Assessment & Plan:   Please see problem based assessment and plan.

## 2015-10-30 NOTE — Assessment & Plan Note (Signed)
Check BMET today 

## 2015-10-30 NOTE — Assessment & Plan Note (Signed)
FOBT stool cards negative in September 2016.

## 2015-10-30 NOTE — Assessment & Plan Note (Signed)
BP Readings from Last 3 Encounters:  10/30/15 123/68  08/15/15 129/67  03/21/15 138/74    Lab Results  Component Value Date   NA 134* 03/21/2015   K 3.7 03/21/2015   CREATININE 1.02* 03/21/2015    Assessment: Blood pressure control:  Controlled Progress toward BP goal:   At goal Comments: Compliant with lisinopril 20 mg daily, triamterene-HCTZ 75-50 mg 0.5 tabs QD.  Plan: Medications:  continue current medications

## 2015-10-30 NOTE — Patient Instructions (Signed)
CONTINUE ALL MEDICATIONS THE SAME.  I WILL CALL YOU IF YOUR HEMOGLOBIN A1C IS ELEVATED (FOR YOUR DIABETES).  WE WILL CHECK YOU KIDNEY FUNCTION AND YOUR CALCIUM LEVELS TODAY.

## 2015-10-31 ENCOUNTER — Telehealth: Payer: Self-pay | Admitting: Internal Medicine

## 2015-10-31 ENCOUNTER — Encounter: Payer: Self-pay | Admitting: *Deleted

## 2015-10-31 LAB — BMP8+ANION GAP
ANION GAP: 15 mmol/L (ref 10.0–18.0)
BUN/Creatinine Ratio: 21 (ref 11–26)
BUN: 19 mg/dL (ref 8–27)
CALCIUM: 10.3 mg/dL (ref 8.7–10.3)
CHLORIDE: 96 mmol/L (ref 96–106)
CO2: 25 mmol/L (ref 18–29)
Creatinine, Ser: 0.91 mg/dL (ref 0.57–1.00)
GFR calc Af Amer: 74 mL/min/{1.73_m2} (ref >59)
GFR calc non Af Amer: 65 mL/min/{1.73_m2} (ref >59)
GLUCOSE: 105 mg/dL — AB (ref 65–99)
POTASSIUM: 4.1 mmol/L (ref 3.5–5.2)
Sodium: 136 mmol/L (ref 134–144)

## 2015-10-31 LAB — PTH, INTACT AND CALCIUM
Calcium: 10.3 mg/dL (ref 8.7–10.3)
PTH: 41 pg/mL (ref 15–65)

## 2015-10-31 NOTE — Telephone Encounter (Signed)
[facility] for 10/15/2015 being faxed.

## 2015-11-05 NOTE — Progress Notes (Signed)
Case discussed with Dr. Richardson soon after the resident saw the patient.  We reviewed the resident's history and exam and pertinent patient test results.  I agree with the assessment, diagnosis and plan of care documented in the resident's note. 

## 2016-02-04 ENCOUNTER — Telehealth: Payer: Self-pay | Admitting: Internal Medicine

## 2016-02-04 NOTE — Telephone Encounter (Signed)
APPT. REMINDER CALL, NO ANSWER AND NO VOICE MAIL °

## 2016-02-05 ENCOUNTER — Ambulatory Visit (INDEPENDENT_AMBULATORY_CARE_PROVIDER_SITE_OTHER): Payer: Medicare Other | Admitting: Internal Medicine

## 2016-02-05 ENCOUNTER — Encounter: Payer: Self-pay | Admitting: Internal Medicine

## 2016-02-05 ENCOUNTER — Other Ambulatory Visit: Payer: Self-pay | Admitting: Internal Medicine

## 2016-02-05 VITALS — BP 130/68 | HR 92 | Temp 97.4°F | Ht 63.5 in | Wt 202.2 lb

## 2016-02-05 DIAGNOSIS — Z0000 Encounter for general adult medical examination without abnormal findings: Secondary | ICD-10-CM

## 2016-02-05 DIAGNOSIS — I129 Hypertensive chronic kidney disease with stage 1 through stage 4 chronic kidney disease, or unspecified chronic kidney disease: Secondary | ICD-10-CM

## 2016-02-05 DIAGNOSIS — Z1231 Encounter for screening mammogram for malignant neoplasm of breast: Secondary | ICD-10-CM

## 2016-02-05 DIAGNOSIS — Z7984 Long term (current) use of oral hypoglycemic drugs: Secondary | ICD-10-CM

## 2016-02-05 DIAGNOSIS — E1122 Type 2 diabetes mellitus with diabetic chronic kidney disease: Secondary | ICD-10-CM | POA: Diagnosis not present

## 2016-02-05 DIAGNOSIS — N182 Chronic kidney disease, stage 2 (mild): Secondary | ICD-10-CM

## 2016-02-05 DIAGNOSIS — E1165 Type 2 diabetes mellitus with hyperglycemia: Secondary | ICD-10-CM

## 2016-02-05 DIAGNOSIS — E119 Type 2 diabetes mellitus without complications: Secondary | ICD-10-CM

## 2016-02-05 DIAGNOSIS — I10 Essential (primary) hypertension: Secondary | ICD-10-CM

## 2016-02-05 LAB — GLUCOSE, CAPILLARY: GLUCOSE-CAPILLARY: 108 mg/dL — AB (ref 65–99)

## 2016-02-05 LAB — POCT GLYCOSYLATED HEMOGLOBIN (HGB A1C): HEMOGLOBIN A1C: 7.4

## 2016-02-05 NOTE — Patient Instructions (Signed)
CONTINUE ALL MEDICATIONS THE SAME.  WORK ON DIET AND EXERCISE WITH EXAMPLES LISTED BELOW.  FOLLOW UP IN 3 MONTHS.  Diabetes Mellitus and Food It is important for you to manage your blood sugar (glucose) level. Your blood glucose level can be greatly affected by what you eat. Eating healthier foods in the appropriate amounts throughout the day at about the same time each day will help you control your blood glucose level. It can also help slow or prevent worsening of your diabetes mellitus. Healthy eating may even help you improve the level of your blood pressure and reach or maintain a healthy weight.  General recommendations for healthful eating and cooking habits include:  Eating meals and snacks regularly. Avoid going long periods of time without eating to lose weight.  Eating a diet that consists mainly of plant-based foods, such as fruits, vegetables, nuts, legumes, and whole grains.  Using low-heat cooking methods, such as baking, instead of high-heat cooking methods, such as deep frying. Work with your dietitian to make sure you understand how to use the Nutrition Facts information on food labels. HOW CAN FOOD AFFECT ME? Carbohydrates Carbohydrates affect your blood glucose level more than any other type of food. Your dietitian will help you determine how many carbohydrates to eat at each meal and teach you how to count carbohydrates. Counting carbohydrates is important to keep your blood glucose at a healthy level, especially if you are using insulin or taking certain medicines for diabetes mellitus. Alcohol Alcohol can cause sudden decreases in blood glucose (hypoglycemia), especially if you use insulin or take certain medicines for diabetes mellitus. Hypoglycemia can be a life-threatening condition. Symptoms of hypoglycemia (sleepiness, dizziness, and disorientation) are similar to symptoms of having too much alcohol.  If your health care provider has given you approval to drink  alcohol, do so in moderation and use the following guidelines:  Women should not have more than one drink per day, and men should not have more than two drinks per day. One drink is equal to:  12 oz of beer.  5 oz of wine.  1 oz of hard liquor.  Do not drink on an empty stomach.  Keep yourself hydrated. Have water, diet soda, or unsweetened iced tea.  Regular soda, juice, and other mixers might contain a lot of carbohydrates and should be counted. WHAT FOODS ARE NOT RECOMMENDED? As you make food choices, it is important to remember that all foods are not the same. Some foods have fewer nutrients per serving than other foods, even though they might have the same number of calories or carbohydrates. It is difficult to get your body what it needs when you eat foods with fewer nutrients. Examples of foods that you should avoid that are high in calories and carbohydrates but low in nutrients include:  Trans fats (most processed foods list trans fats on the Nutrition Facts label).  Regular soda.  Juice.  Candy.  Sweets, such as cake, pie, doughnuts, and cookies.  Fried foods. WHAT FOODS CAN I EAT? Eat nutrient-rich foods, which will nourish your body and keep you healthy. The food you should eat also will depend on several factors, including:  The calories you need.  The medicines you take.  Your weight.  Your blood glucose level.  Your blood pressure level.  Your cholesterol level. You should eat a variety of foods, including:  Protein.  Lean cuts of meat.  Proteins low in saturated fats, such as fish, egg whites, and beans. Avoid  processed meats.  Fruits and vegetables.  Fruits and vegetables that may help control blood glucose levels, such as apples, mangoes, and yams.  Dairy products.  Choose fat-free or low-fat dairy products, such as milk, yogurt, and cheese.  Grains, bread, pasta, and rice.  Choose whole grain products, such as multigrain bread, whole  oats, and brown rice. These foods may help control blood pressure.  Fats.  Foods containing healthful fats, such as nuts, avocado, olive oil, canola oil, and fish. DOES EVERYONE WITH DIABETES MELLITUS HAVE THE SAME MEAL PLAN? Because every person with diabetes mellitus is different, there is not one meal plan that works for everyone. It is very important that you meet with a dietitian who will help you create a meal plan that is just right for you.   This information is not intended to replace advice given to you by your health care provider. Make sure you discuss any questions you have with your health care provider.   Document Released: 07/30/2005 Document Revised: 11/23/2014 Document Reviewed: 09/29/2013 Elsevier Interactive Patient Education [employer]2016 Elsevier Inc.

## 2016-02-05 NOTE — Assessment & Plan Note (Signed)
GFR 74 in December 2016. We will continue to monitor intermittently and continue blood pressure and diabetes control.

## 2016-02-05 NOTE — Assessment & Plan Note (Signed)
Mammogram: Ordered today Colonoscopy: FOBT negative 2016 PPSV23: Patient refuses

## 2016-02-05 NOTE — Assessment & Plan Note (Signed)
BP Readings from Last 3 Encounters:  02/05/16 130/68  10/30/15 123/68  08/15/15 129/67    Lab Results  Component Value Date   NA 136 10/30/2015   K 4.1 10/30/2015   CREATININE 0.91 10/30/2015    Assessment: Blood pressure control:  Well controlled Progress toward BP goal:   Stable Comments: Compliant with lisinopril 20 mg dialy and triamterene-HCTZ 75-50 mg 1/2 tab QD  Plan: Medications:  continue current medications

## 2016-02-05 NOTE — Assessment & Plan Note (Signed)
Lab Results  Component Value Date   HGBA1C 7.4 02/05/2016   HGBA1C 6.8 10/30/2015   HGBA1C 7.2 08/15/2015     Assessment: Diabetes control:   Above goal Progress toward A1C goal:   Deteriorated Comments: Patient admits to worsened diet over the past several months, but reports compliance with Metformin 500 mg BID.   Plan: Medications:  continue current medications Instruction/counseling given: discussed the need for weight loss and discussed diet Educational resources provided:   Self management tools provided:   Other plans: Patient would like to continue Metformin 500 mg BID WC and work on diet and exercise for the next 3 months, then re-evaluate A1c prior to increasing dose of metformin.

## 2016-02-05 NOTE — Progress Notes (Signed)
Subjective:    Patient ID: Sarah Branch, female    DOB: 1946/03/26, 70 y.o.   MRN: [id number]  HPI Sarah Branch is a 70 y.o. female with PMHx of HTN, T2DM, and CKD II who presents to the clinic for follow up for T2DM. Please see A&P for the status of the patient's chronic medical problems.   Past Medical History  Diagnosis Date  . Anemia     -NOS- Positive stool guaiac cards, refuses colonoscopy  . Hyperlipidemia   . Hypertension   . Hypercalcemia     NI SPEP, UPEP, PTH  . Proteinuria   . Hypokalemia     2nd diuretics  . Exposure to second hand smoke   . Shoulder pain     Work Related    Outpatient Encounter Prescriptions as of 02/05/2016  Medication Sig  . aspirin 81 MG EC tablet Take 1 tablet (81 mg total) by mouth daily.  Marland Kitchen lisinopril (PRINIVIL,ZESTRIL) 20 MG tablet Take 1 tablet (20 mg total) by mouth daily.  Marland Kitchen loratadine (CLARITIN) 10 MG tablet Take 1 tablet (10 mg total) by mouth daily.  . metFORMIN (GLUCOPHAGE) 500 MG tablet Take 1 tablet (500 mg total) by mouth 2 (two) times daily with a meal.  . pravastatin (PRAVACHOL) 40 MG tablet TAKE ONE TABLET BY MOUTH ONCE DAILY  . triamterene-hydrochlorothiazide (MAXZIDE) 75-50 MG per tablet Take 0.5 tablets by mouth daily.   No facility-administered encounter medications on file as of 02/05/2016.    Family History  Problem Relation Age of Onset  . Cancer Mother   . Vision loss Father     Social History   Social History  . Marital Status: Married    Spouse Name: N/A  . Number of Children: N/A  . Years of Education: N/A   Occupational History  . Not on file.   Social History Main Topics  . Smoking status: Never Smoker   . Smokeless tobacco: Not on file     Comment: Husband smoked around for years  . Alcohol Use: No  . Drug Use: No  . Sexual Activity: Not on file   Other Topics Concern  . Not on file   Social History Narrative   Financial assistance application initiated - further paperwork needed  per [name] 02/25/2010   Wrked as Copy, now retired.   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   No alcohol, drug or regular exercise   Never smoked, but husband has smoked around her for yrs.             Review of Systems General: Denies fever, chills, fatigue, change in appetite.  Respiratory: Denies SOB, cough, DOE, chest tightness.   Cardiovascular: Denies chest pain and palpitations.  Gastrointestinal: Denies diarrhea, constipation Endocrine: Denies polyuria, and polydipsia. Musculoskeletal: Denies myalgias, back pain.  Skin: Denies pallor, rash and wounds.  Neurological: Denies dizziness, headaches, weakness, lightheadedness    Objective:   Physical Exam Filed Vitals:   02/05/16 1324  BP: 130/68  Pulse: 92  Temp: 97.4 F (36.3 C)  TempSrc: Oral  Height: 5' 3.5" (1.613 m)  Weight: 202 lb 3.2 oz (91.717 kg)  SpO2: 98%   General: Vital signs reviewed.  Patient is well-developed and well-nourished, in no acute distress and cooperative with exam.  Neck: Supple, trachea midline, no carotid bruit present.  Cardiovascular: Irregular rhythm, regular rate, S1 normal, S2 normal, no murmurs, gallops, or rubs.  Pulmonary/Chest: Clear to auscultation bilaterally, no wheezes, rales, or rhonchi. Abdominal: Soft, non-tender, non-distended, BS + Extremities: No lower extremity edema bilaterally Neurological: A&O x3 Skin: Warm, dry and intact.  Psychiatric: Normal mood and affect. speech and behavior is normal. Cognition and memory are normal.      Assessment & Plan:   Please see problem based assessment and plan.

## 2016-02-06 NOTE — Progress Notes (Signed)
Internal Medicine Clinic Attending  Case discussed with Dr. Burns at the time of the visit.  We reviewed the resident's history and exam and pertinent patient test results.  I agree with the assessment, diagnosis, and plan of care documented in the resident's note.  

## 2016-02-28 ENCOUNTER — Ambulatory Visit
Admission: RE | Admit: 2016-02-28 | Discharge: 2016-02-28 | Disposition: A | Discharge status: Home / Self Care | Payer: Medicare Other | Source: Ambulatory Visit | Attending: Internal Medicine | Admitting: Internal Medicine

## 2016-02-28 DIAGNOSIS — Z1231 Encounter for screening mammogram for malignant neoplasm of breast: Secondary | ICD-10-CM

## 2016-03-24 ENCOUNTER — Other Ambulatory Visit: Payer: Self-pay | Admitting: Internal Medicine

## 2016-04-20 ENCOUNTER — Other Ambulatory Visit: Payer: Self-pay | Admitting: Internal Medicine

## 2016-05-06 ENCOUNTER — Ambulatory Visit (INDEPENDENT_AMBULATORY_CARE_PROVIDER_SITE_OTHER): Payer: Medicare Other | Admitting: Internal Medicine

## 2016-05-06 ENCOUNTER — Encounter: Payer: Self-pay | Admitting: Internal Medicine

## 2016-05-06 VITALS — BP 122/64 | HR 84 | Temp 98.2°F | Ht 63.5 in | Wt 199.2 lb

## 2016-05-06 DIAGNOSIS — Z0000 Encounter for general adult medical examination without abnormal findings: Secondary | ICD-10-CM

## 2016-05-06 DIAGNOSIS — Z7984 Long term (current) use of oral hypoglycemic drugs: Secondary | ICD-10-CM

## 2016-05-06 DIAGNOSIS — I129 Hypertensive chronic kidney disease with stage 1 through stage 4 chronic kidney disease, or unspecified chronic kidney disease: Secondary | ICD-10-CM

## 2016-05-06 DIAGNOSIS — N182 Chronic kidney disease, stage 2 (mild): Secondary | ICD-10-CM | POA: Diagnosis not present

## 2016-05-06 DIAGNOSIS — E119 Type 2 diabetes mellitus without complications: Secondary | ICD-10-CM

## 2016-05-06 DIAGNOSIS — E1122 Type 2 diabetes mellitus with diabetic chronic kidney disease: Secondary | ICD-10-CM | POA: Diagnosis not present

## 2016-05-06 DIAGNOSIS — E2839 Other primary ovarian failure: Secondary | ICD-10-CM

## 2016-05-06 DIAGNOSIS — I10 Essential (primary) hypertension: Secondary | ICD-10-CM

## 2016-05-06 DIAGNOSIS — I455 Other specified heart block: Secondary | ICD-10-CM | POA: Insufficient documentation

## 2016-05-06 DIAGNOSIS — I498 Other specified cardiac arrhythmias: Secondary | ICD-10-CM | POA: Diagnosis not present

## 2016-05-06 LAB — POCT GLYCOSYLATED HEMOGLOBIN (HGB A1C): HEMOGLOBIN A1C: 7.5

## 2016-05-06 LAB — GLUCOSE, CAPILLARY: Glucose-Capillary: 100 mg/dL — ABNORMAL HIGH (ref 65–99)

## 2016-05-06 MED ORDER — METFORMIN HCL 500 MG PO TABS: ORAL_TABLET | ORAL | Status: DC

## 2016-05-06 NOTE — Patient Instructions (Addendum)
INCREASE METFORMIN TO 2 PILLS IN THE MORNING AND 1 AT NIGHT.   WE WILL ORDER A DEXA SCAN FOR YOU TO SCREEN FOR OSTEOPOROSIS.   PLEASE FOLLOW UP IN 3 MONTHS.   Osteoporosis Osteoporosis happens when your bones become thinner and weaker. Weak bones can break (fracture) more easily when you slip or fall. Bones most at risk of breaking are in the hip, wrist, and spine. HOME CARE  Get enough calcium and vitamin D. These nutrients are good for your bones.  Exercise as told by your doctor.  Do not use any tobacco products. This includes cigarettes, chewing tobacco, and electronic cigarettes. If you need help quitting, ask your doctor.  Limit the amount of alcohol you drink.  Take medicines only as told by your doctor.  Keep all follow-up visits as told by your doctor. This is important.  Take care at home to prevent falls. Some ways to do this are:  Keep rooms well lit and tidy.  Put safety rails on your stairs.  Put a rubber mat in the bathroom and other places that are often wet or slippery. GET HELP RIGHT AWAY IF:  You fall.  You hurt yourself.   This information is not intended to replace advice given to you by your health care provider. Make sure you discuss any questions you have with your health care provider.   Document Released: 01/25/2012 Document Revised: 11/23/2014 Document Reviewed: 04/12/2014 Elsevier Interactive Patient Education [employer]2016 Elsevier Inc.

## 2016-05-06 NOTE — Assessment & Plan Note (Addendum)
BP Readings from Last 3 Encounters:  05/06/16 122/64  02/05/16 130/68  10/30/15 123/68    Lab Results  Component Value Date   NA 136 10/30/2015   K 4.1 10/30/2015   CREATININE 0.91 10/30/2015    Assessment: Blood pressure control:  Controlled Progress toward BP goal:   Stable Comments: Compliant with lisinopril 20 mg daily and triamterene-HCTZ 75-50 mg 1/2 tab daily.   Plan: Medications:  continue current medications Other plans: Recheck BMET in December 2017

## 2016-05-06 NOTE — Progress Notes (Signed)
Subjective:    Patient ID: Sarah Branch, female    DOB: May 09, 1946, 70 y.o.   MRN: [id number]  HPI Sarah Branch is a 70 y.o. female with PMHx of HTN, T2DM, CKD II, and HLD who presents to the clinic for follow up for T2DM. Please see A&P for the status of the patient's chronic medical problems.   Past Medical History  Diagnosis Date  . Anemia     -NOS- Positive stool guaiac cards, refuses colonoscopy  . Hyperlipidemia   . Hypertension   . Hypercalcemia     NI SPEP, UPEP, PTH  . Proteinuria   . Hypokalemia     2nd diuretics  . Exposure to second hand smoke   . Shoulder pain     Work Related    Outpatient Encounter Prescriptions as of 05/06/2016  Medication Sig  . aspirin 81 MG EC tablet Take 1 tablet (81 mg total) by mouth daily.  . EQ LORATADINE 10 MG tablet TAKE ONE TABLET BY MOUTH ONCE DAILY  . lisinopril (PRINIVIL,ZESTRIL) 20 MG tablet Take 1 tablet (20 mg total) by mouth daily.  . metFORMIN (GLUCOPHAGE) 500 MG tablet Take 1 tablet (500 mg total) by mouth 2 (two) times daily with a meal.  . pravastatin (PRAVACHOL) 40 MG tablet TAKE ONE TABLET BY MOUTH ONCE DAILY  . triamterene-hydrochlorothiazide (MAXZIDE) 75-50 MG tablet TAKE ONE-HALF TABLET BY MOUTH ONCE DAILY   No facility-administered encounter medications on file as of 05/06/2016.    Family History  Problem Relation Age of Onset  . Cancer Mother   . Vision loss Father     Social History   Social History  . Marital Status: Married    Spouse Name: N/A  . Number of Children: N/A  . Years of Education: N/A   Occupational History  . Not on file.   Social History Main Topics  . Smoking status: Never Smoker   . Smokeless tobacco: Not on file     Comment: Husband smoked around for years  . Alcohol Use: No  . Drug Use: No  . Sexual Activity: Not on file   Other Topics Concern  . Not on file   Social History Narrative   Financial assistance application initiated - further paperwork needed per  [name] 02/25/2010   Wrked as Copy, now retired.   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   No alcohol, drug or regular exercise   Never smoked, but husband has smoked around her for yrs.            Review of Systems General: Denies fatigue, change in appetite.  Respiratory: Denies SOB, cough, DOE.   Cardiovascular: Denies chest pain and palpitations.  Gastrointestinal: Denies abdominal pain, diarrhea, constipation.  Endocrine: Denies polyuria, and polydipsia. Musculoskeletal: Admits to occasional arthralgias in the morning. Denies myalgias, back pain, joint swelling Skin: Denies rash.  Neurological: Denies dizziness, headaches, weakness, lightheadedness    Objective:   Physical Exam Filed Vitals:   05/06/16 1351  BP: 122/64  Pulse: 84  Temp: 98.2 F (36.8 C)  TempSrc: Oral  Height: 5' 3.5" (1.613 m)  Weight: 199 lb 3.2 oz (90.357 kg)  SpO2: 98%   General: Vital signs reviewed.  Patient is well-developed and well-nourished, in no acute distress and cooperative with exam.  Neck: Supple, trachea midline, no carotid bruit present.  Cardiovascular: Irregular rhythm, regular rate, S1 normal, S2 normal, no murmurs, gallops,  or rubs. Pulmonary/Chest: Clear to auscultation bilaterally, no wheezes, rales, or rhonchi. Abdominal: Soft, non-tender, non-distended, BS + Extremities: No lower extremity edema bilaterally, pulses symmetric and intact bilaterally.  Skin: Warm, dry and intact.  Psychiatric: Normal mood and affect. speech and behavior is normal. Cognition and memory are normal.      Assessment & Plan:   Please see problem based assessment and plan.

## 2016-05-06 NOTE — Assessment & Plan Note (Addendum)
Lab Results  Component Value Date   HGBA1C 7.5 05/06/2016   HGBA1C 7.4 02/05/2016   HGBA1C 6.8 10/30/2015     Assessment: Diabetes control: Above goal Progress toward A1C goal:    Deteriorated Comments: Patient is on Metformin 500 mg BID.   Plan: Medications: Increase to Metformin 1000 mg QAM and 500 mg QPM. Instruction/counseling given: reminded to bring blood glucose meter & log to each visit and discussed diet Other plans: Follow up in 3 months

## 2016-05-06 NOTE — Assessment & Plan Note (Signed)
Will repeat BMET in December 2017.

## 2016-05-06 NOTE — Assessment & Plan Note (Signed)
Mammogram: Normal, April 2017 FOBT: Negative October 2016 DEXA: Ordered Patient refuses Hep C screening at this time

## 2016-05-06 NOTE — Assessment & Plan Note (Signed)
Patient has an irregular rhythm on examination, but regular heart rate. No MRG. Previous EKGs were reviewed which confirmed a sinus pauses with PVCs in 2016. Patient is asymptomatic- Sarah Branch denies chest pain, shortness of breath, palpitations or lightheadedness. Given that the patient is asymptomatic, no treatment is necessary at this time.   Plan: -Continue to monitor

## 2016-05-07 IMAGING — CT CT HEAD W/O CM
1 series · 16 of 30 positions shown, 20 images · non-contrast
Comparison: None.

CLINICAL DATA: Seizures. No history of seizures. Initial encounter.

EXAM:
CT HEAD WITHOUT CONTRAST
TECHNIQUE: Contiguous axial images were obtained from the base of the skull
through the vertex without intravenous contrast.

[Series 2: head 5.0 h31s · axial · 0.42mm/px · z∈[-174,-39]mm · 16 of 31 slices shown, 20 images]
[im 2/31  brain]
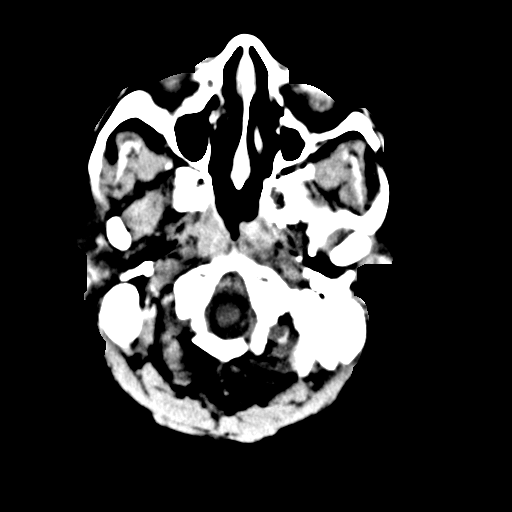
[im 2/31  bone]
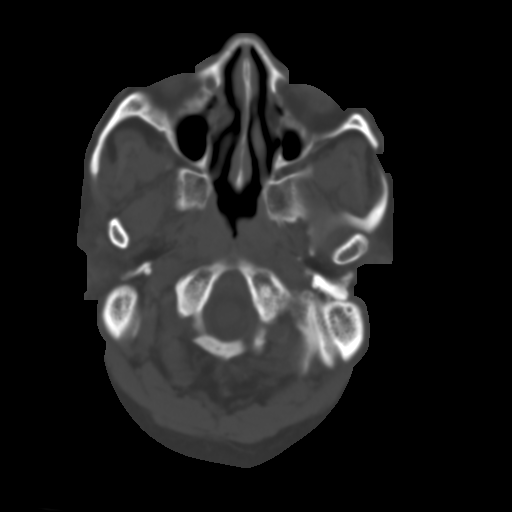
[im 4/31  brain]
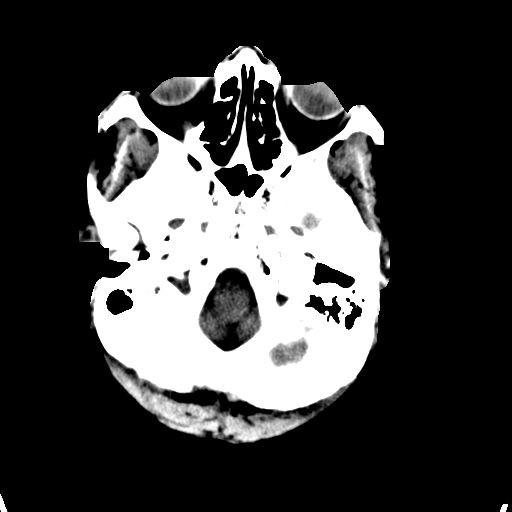
[im 6/31  brain]
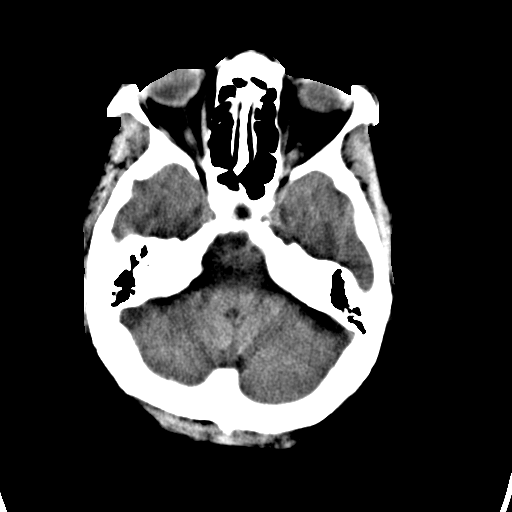
[im 8/31  brain]
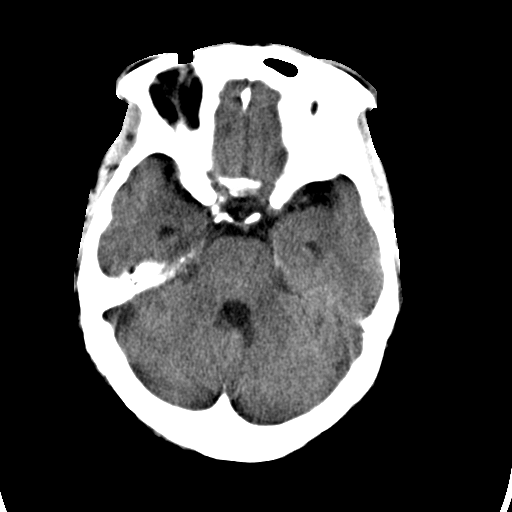
[im 9/31  brain]
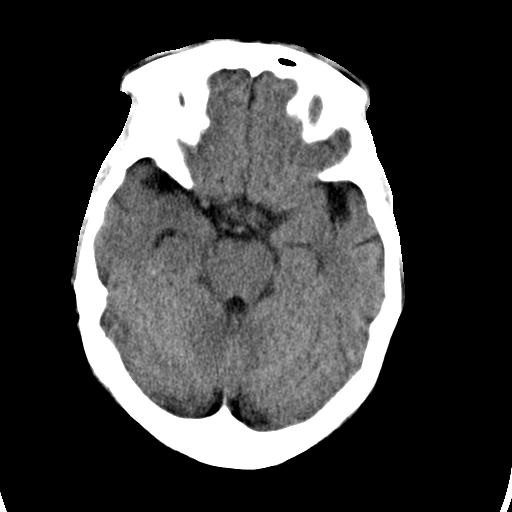
[im 9/31  bone]
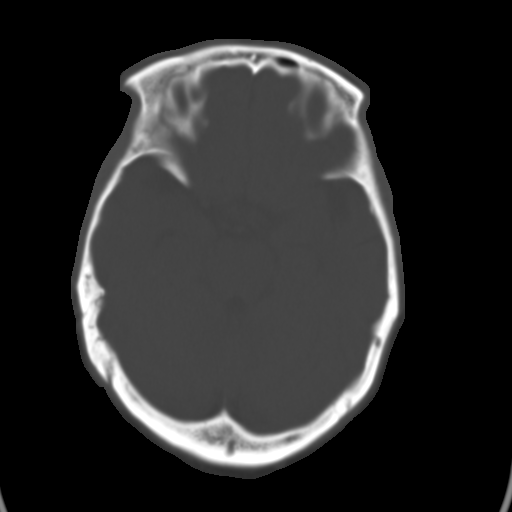
[im 11/31  brain]
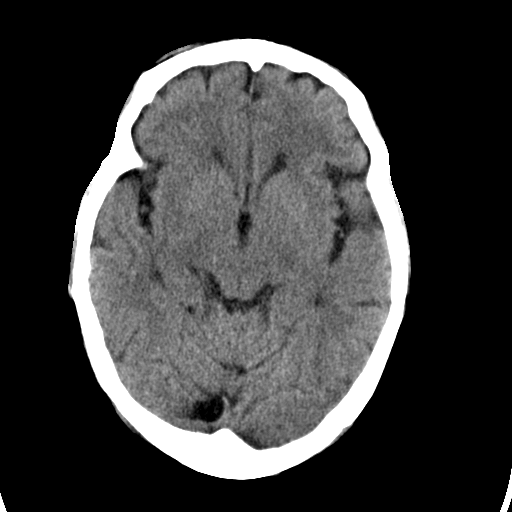
[im 13/31  brain]
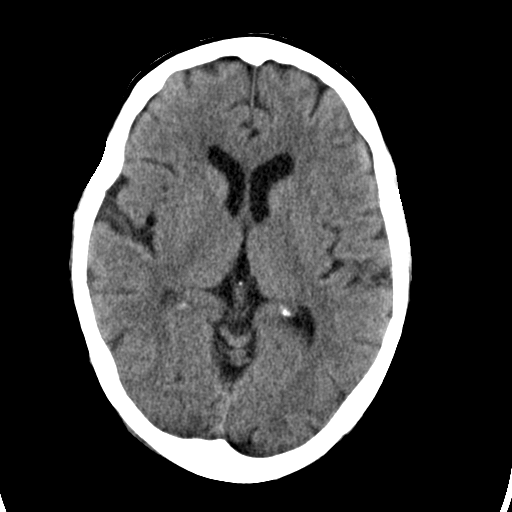
[im 15/31  brain]
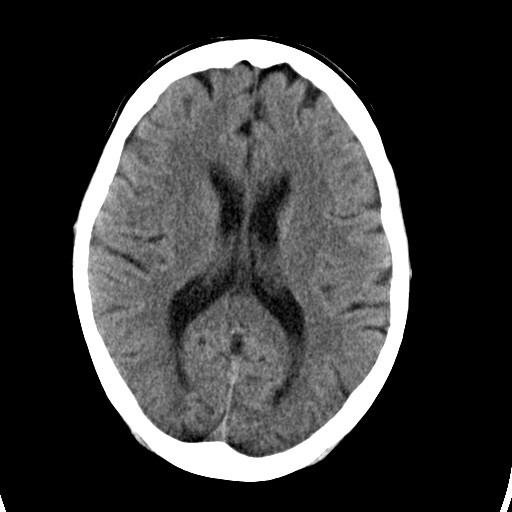
[im 16/31  brain]
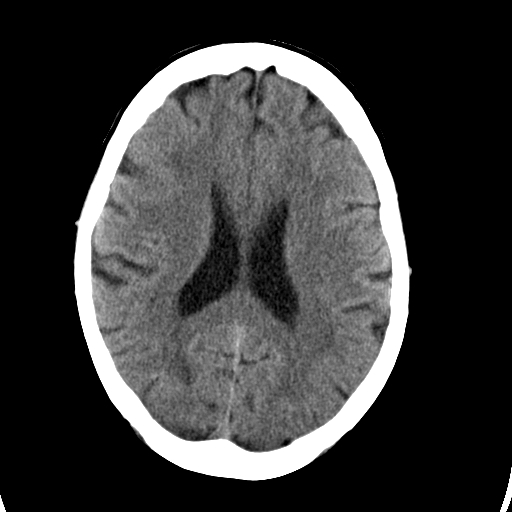
[im 16/31  bone]
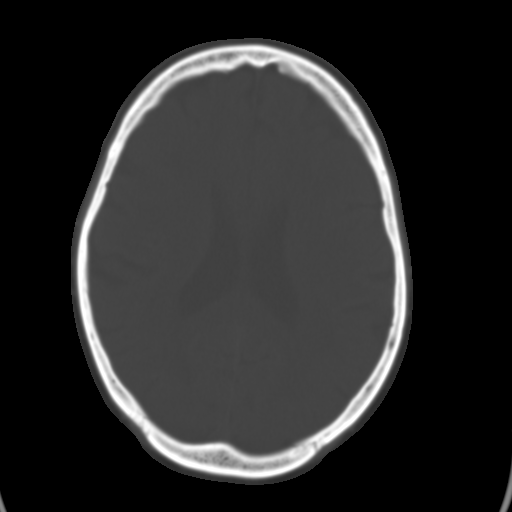
[im 18/31  brain]
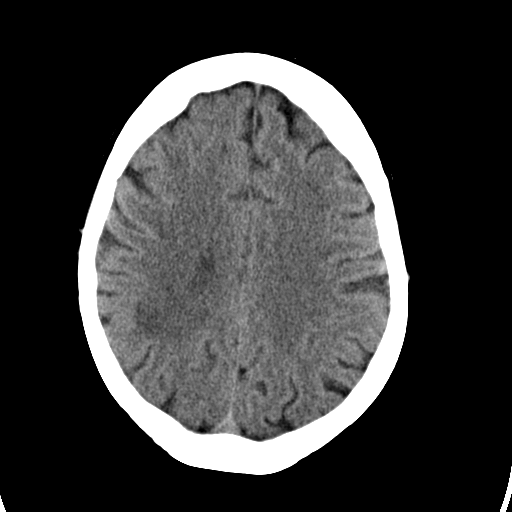
[im 20/31  brain]
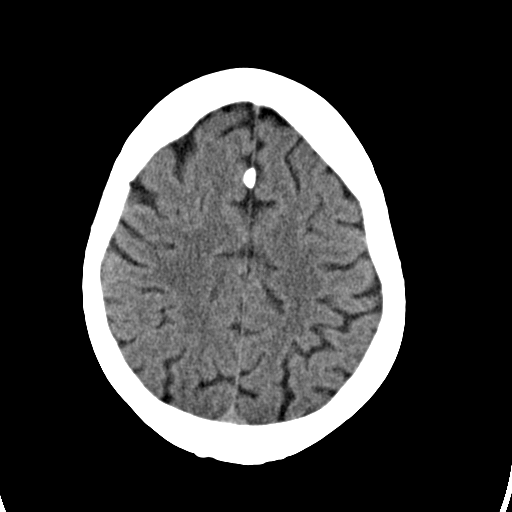
[im 22/31  brain]
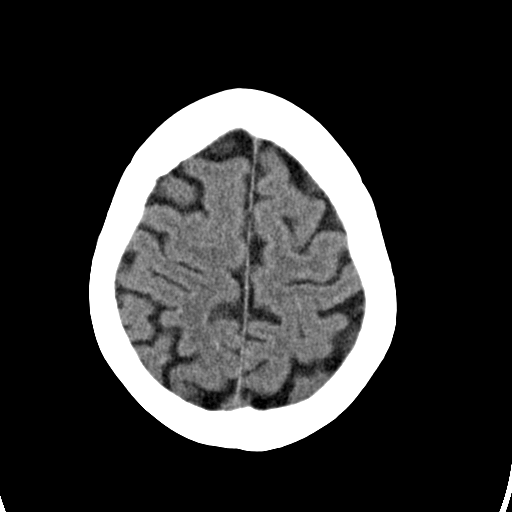
[im 23/31  brain]
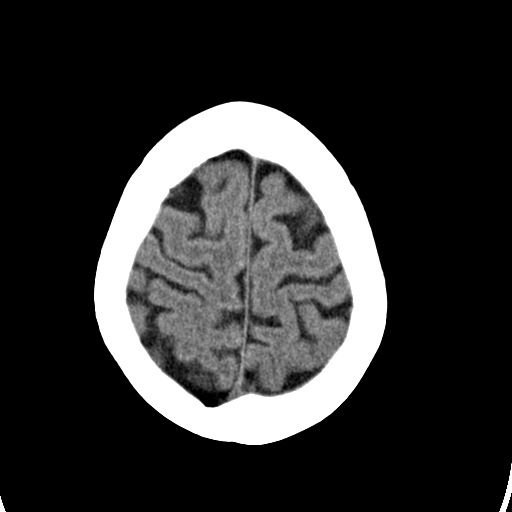
[im 23/31  bone]
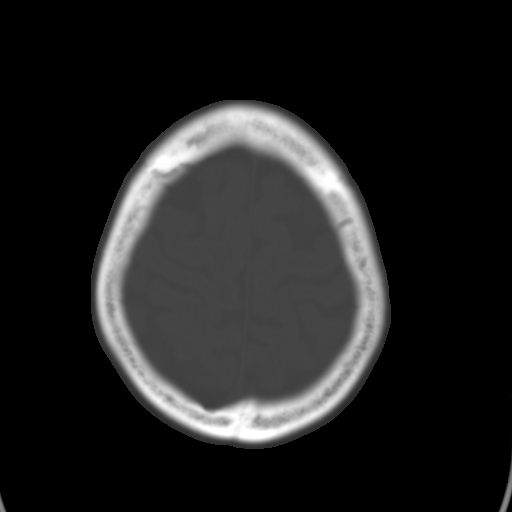
[im 25/31  brain]
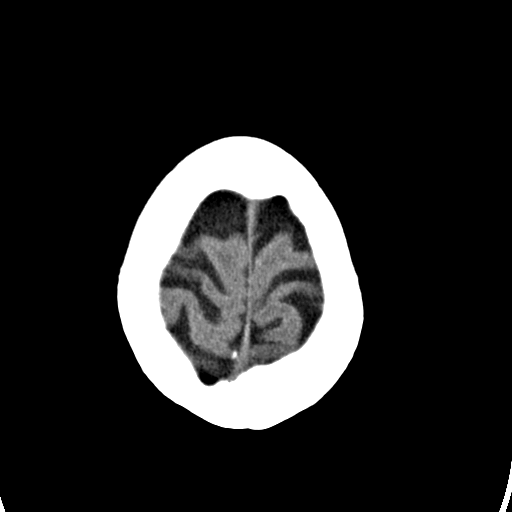
[im 27/31  brain]
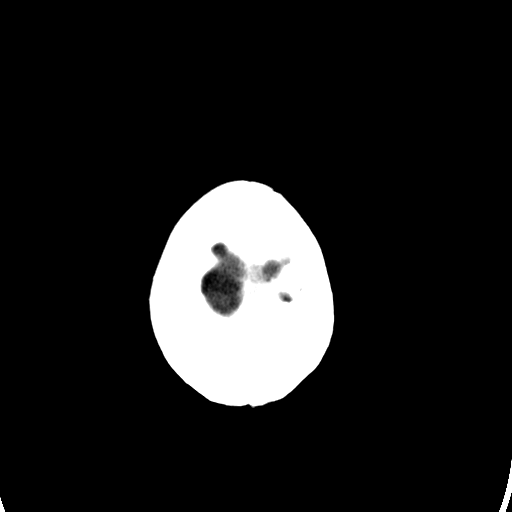
[im 29/31  brain]
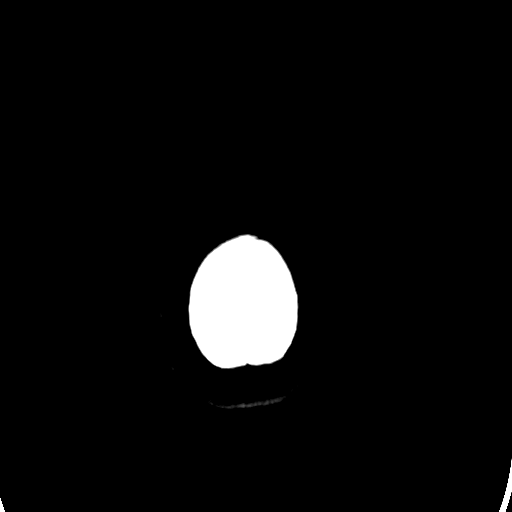

[16 of 30 positions shown; findings below may reference images not displayed]

FINDINGS: No mass lesion, mass effect, midline shift, hydrocephalus,
hemorrhage. No acute territorial cortical ischemia/infarct. Atrophy
and white matter disease is present, likely chronic ischemic.
Atrophy is age appropriate. Intracranial atherosclerosis. Mild
mucosal thickening in the paranasal sinuses. Mastoid air cells
clear.
IMPRESSION: 1. No acute intracranial abnormality.
2. Age-appropriate atrophy. Likely chronic ischemic white matter
disease.

## 2016-05-07 IMAGING — CR DG CHEST 1V PORT
1 series · 1 of 1 positions shown · non-contrast
Comparison: None.

CLINICAL DATA: Shortness of breath.  Weakness.

EXAM:
PORTABLE CHEST - 1 VIEW

[AP]
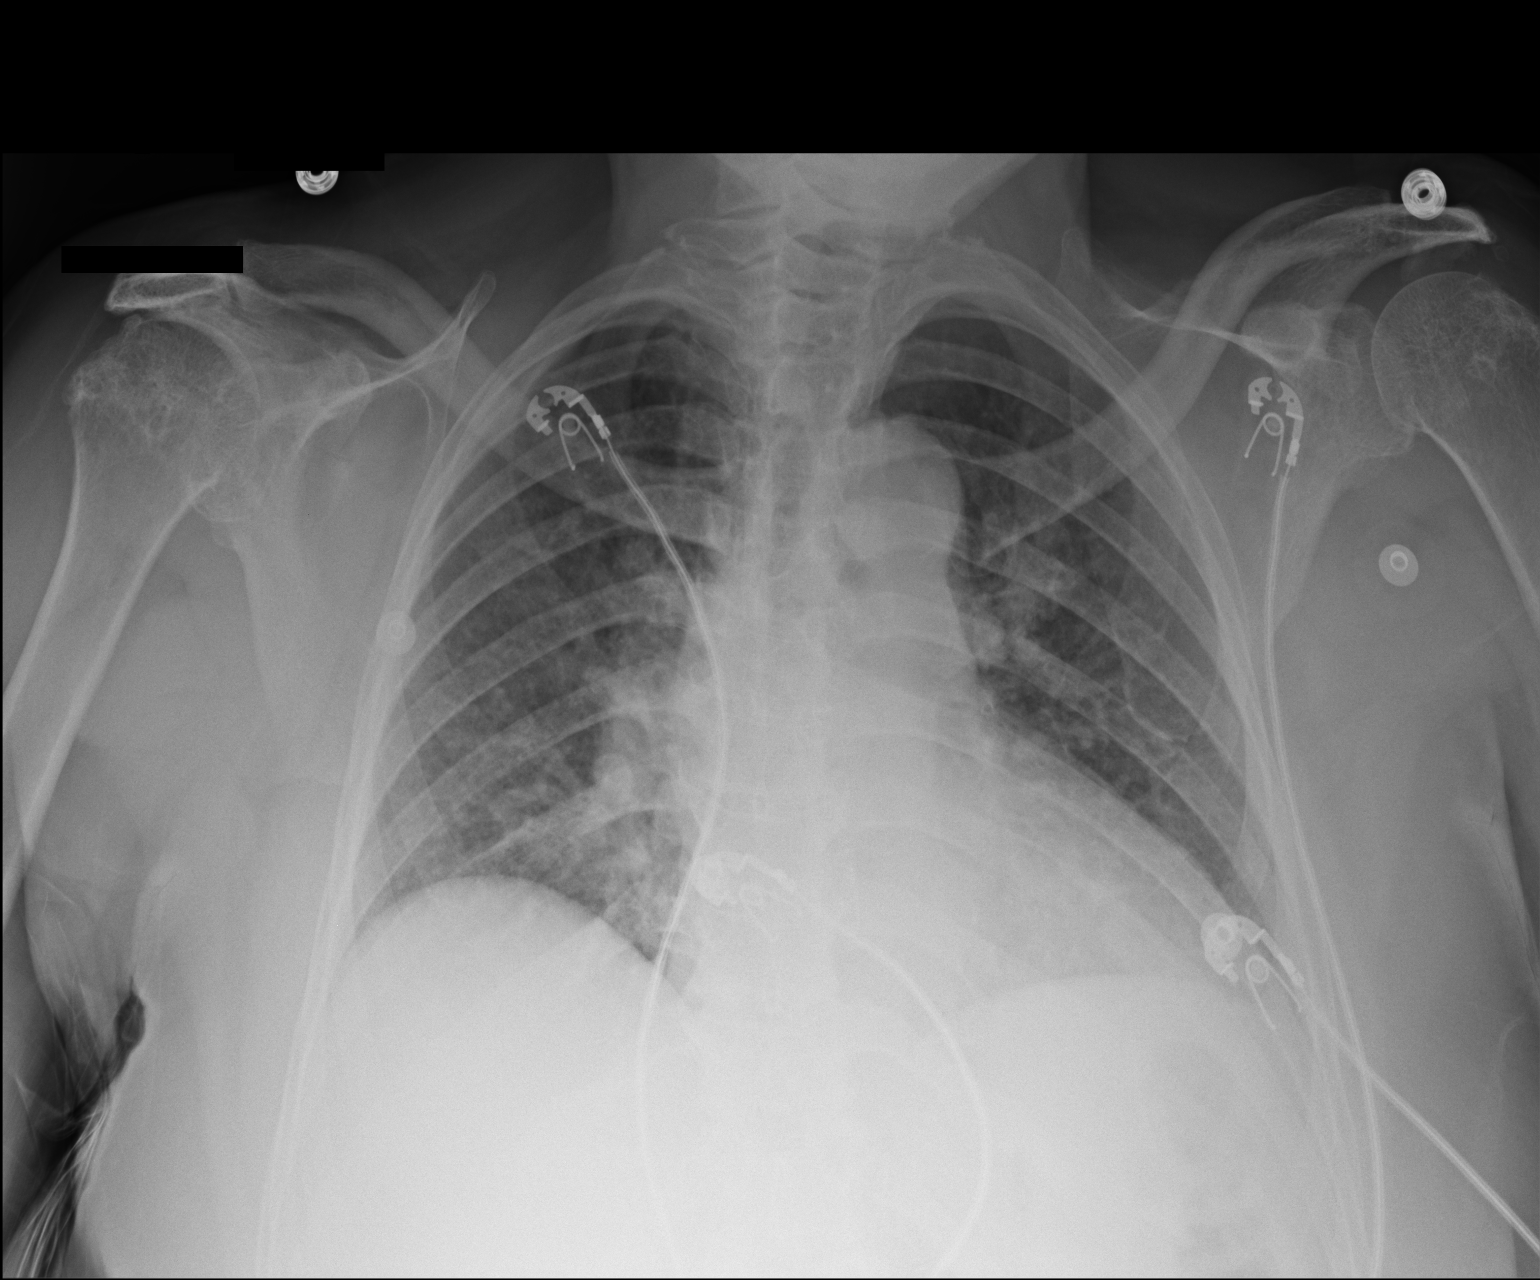

[1 of 1 positions shown; findings below may reference images not displayed]

FINDINGS: Mediastinum and hilar structures normal. Cardiomegaly mild pulmonary
vascular prominence. Mild interstitial prominence. Mild component
congestive heart failure cannot be excluded. A lung volumes with
mild basilar atelectasis. No acute bony abnormality. Degenerative
changes both shoulders.
IMPRESSION: 1. Cardiomegaly with mild pulmonary vascular prominence interstitial
prominence suggesting mild congestive heart failure.
2. Low lung volumes with mild basilar atelectasis.

## 2016-05-07 NOTE — Progress Notes (Signed)
Medicine attending: Medical history, presenting problems, physical findings, and medications, reviewed with resident physician Dr Alexa Burns on the day of the patient visit and I concur with her evaluation and management plan. 

## 2016-06-02 ENCOUNTER — Ambulatory Visit
Admission: RE | Admit: 2016-06-02 | Discharge: 2016-06-02 | Disposition: A | Discharge status: Home / Self Care | Payer: Medicare Other | Source: Ambulatory Visit | Attending: Oncology | Admitting: Oncology

## 2016-06-02 ENCOUNTER — Telehealth: Payer: Self-pay | Admitting: Internal Medicine

## 2016-06-02 DIAGNOSIS — E2839 Other primary ovarian failure: Secondary | ICD-10-CM

## 2016-06-02 DIAGNOSIS — Z780 Asymptomatic menopausal state: Secondary | ICD-10-CM | POA: Diagnosis not present

## 2016-06-02 DIAGNOSIS — Z13820 Encounter for screening for osteoporosis: Secondary | ICD-10-CM | POA: Diagnosis not present

## 2016-06-02 NOTE — Telephone Encounter (Signed)
Attempted to call patient x 2 to inform her of her normal DEXA scan results.   Karlene LinemanAlexa Burns, DO PGY-3 Internal Medicine Resident Pager # [phone] 06/02/2016 11:42 AM

## 2016-07-14 ENCOUNTER — Other Ambulatory Visit: Payer: Self-pay | Admitting: Internal Medicine

## 2016-08-11 ENCOUNTER — Telehealth: Payer: Self-pay | Admitting: Internal Medicine

## 2016-08-11 NOTE — Telephone Encounter (Signed)
APT. REMINDER CALL, NO ANSWER, NO VOICEMAIL °

## 2016-08-12 ENCOUNTER — Ambulatory Visit (INDEPENDENT_AMBULATORY_CARE_PROVIDER_SITE_OTHER): Payer: Medicare Other | Admitting: Internal Medicine

## 2016-08-12 ENCOUNTER — Encounter: Payer: Self-pay | Admitting: Internal Medicine

## 2016-08-12 VITALS — BP 124/70 | HR 80 | Temp 98.3°F | Ht 63.5 in | Wt 199.1 lb

## 2016-08-12 DIAGNOSIS — E119 Type 2 diabetes mellitus without complications: Secondary | ICD-10-CM | POA: Diagnosis not present

## 2016-08-12 DIAGNOSIS — E785 Hyperlipidemia, unspecified: Secondary | ICD-10-CM

## 2016-08-12 DIAGNOSIS — I10 Essential (primary) hypertension: Secondary | ICD-10-CM | POA: Diagnosis not present

## 2016-08-12 DIAGNOSIS — Z7984 Long term (current) use of oral hypoglycemic drugs: Secondary | ICD-10-CM

## 2016-08-12 DIAGNOSIS — Z79899 Other long term (current) drug therapy: Secondary | ICD-10-CM | POA: Diagnosis not present

## 2016-08-12 DIAGNOSIS — Z1211 Encounter for screening for malignant neoplasm of colon: Secondary | ICD-10-CM

## 2016-08-12 DIAGNOSIS — Z0000 Encounter for general adult medical examination without abnormal findings: Secondary | ICD-10-CM

## 2016-08-12 LAB — GLUCOSE, CAPILLARY: GLUCOSE-CAPILLARY: 104 mg/dL — AB (ref 65–99)

## 2016-08-12 LAB — POCT GLYCOSYLATED HEMOGLOBIN (HGB A1C): HEMOGLOBIN A1C: 7.2

## 2016-08-12 NOTE — Patient Instructions (Signed)
Ms. Fredricka BonineConnor,  Sarah QuinYou are doing a great job! Please continue taking all of your medications as prescribed. Please complete the stool cards at your convenience. We will see you back in 3 months.   Sincerely,  Dr. Lawerance BachBurns

## 2016-08-12 NOTE — Progress Notes (Signed)
    CC: diabetes follow up  HPI: Sarah Branch is a 70 y.o. female with PMHx of T2DM, CKD II, HLD, and HTN who presents to the clinic for follow up for T2DM. Please see problem based assessment and plan for more information.  Patient denies chest pain or shortness of breath. She is eating and drinking well.   Past Medical History:  Diagnosis Date  . Anemia    -NOS- Positive stool guaiac cards, refuses colonoscopy  . Exposure to second hand smoke   . Hypercalcemia    NI SPEP, UPEP, PTH  . Hyperlipidemia   . Hypertension   . Hypokalemia    2nd diuretics  . Proteinuria   . Shoulder pain    Work Related    Review of Systems: Please see pertinent ROS reviewed in HPI and problem based charting.   Physical Exam: Vitals:   08/12/16 1319 08/12/16 1346  BP: (!) 152/69 124/70  Pulse: 86 80  Temp: 98.3 F (36.8 C)   TempSrc: Oral   SpO2: 99%   Weight: 199 lb 1.6 oz (90.3 kg)   Height: 5' 3.5" (1.613 m)    General: Vital signs reviewed.  Patient is well-developed and well-nourished, in no acute distress and cooperative with exam.  Eyes: PERRL, conjunctivae normal, no scleral icterus.  Neck: Supple, trachea midline, no carotid bruit present.  Cardiovascular: Irregular rhythm, regular rate. Pulmonary/Chest: Clear to auscultation bilaterally, no wheezes, rales, or rhonchi. Abdominal: Soft, non-tender, non-distended, BS +.  Extremities: Trace lower extremity edema bilaterally  Skin: Warm, dry and intact.  Psychiatric: Normal mood and affect. speech and behavior is normal. Cognition and memory are normal.   Assessment & Plan:  See encounters tab for problem based medical decision making. Patient discussed with Dr. Criselda PeachesMullen

## 2016-08-14 MED ORDER — PRAVASTATIN SODIUM 40 MG PO TABS: 40.0000 mg | ORAL_TABLET | Freq: Every day | ORAL | 11 refills | Status: DC

## 2016-08-14 NOTE — Addendum Note (Signed)
Addended by: Karlene LinemanBURNS, Juanluis Guastella R on: 08/14/2016 11:18 AM   Modules accepted: Orders

## 2016-08-14 NOTE — Assessment & Plan Note (Signed)
Patient reports compliance with pravastatin 40 mg daily. She denies myalgias.  Plan: -Continue Pravastatin 40 mg QD

## 2016-08-14 NOTE — Assessment & Plan Note (Signed)
Lab Results  Component Value Date   HGBA1C 7.2 08/12/2016   HGBA1C 7.5 05/06/2016   HGBA1C 7.4 02/05/2016     Assessment: Diabetes control:   Controlled Progress toward A1C goal:    At goal Comments: Compliant with Metformin 1000 mg QAM and 500 mg QPM  Plan: Medications:  continue current medications Instruction/counseling given: discussed the need for weight loss and discussed diet Other plans: Follow up in 3 months

## 2016-08-14 NOTE — Assessment & Plan Note (Signed)
DEXA: Normal 05/2016 Mammogram: N 02/2016 Colon Cancer Screening: N Fall 2016, provided with fecal occult stool cards

## 2016-08-14 NOTE — Assessment & Plan Note (Signed)
BP Readings from Last 3 Encounters:  08/12/16 124/70  05/06/16 122/64  02/05/16 130/68    Lab Results  Component Value Date   NA 136 10/30/2015   K 4.1 10/30/2015   CREATININE 0.91 10/30/2015    Assessment: Blood pressure control:  Controlled Progress toward BP goal:   At goal Comments: Compliant with HCTZ-triameterene 50-75 mg 1/2 tab QD, and lisinopril 20 mg daily.   Plan: Medications:  continue current medications

## 2016-08-25 NOTE — Progress Notes (Signed)
Internal Medicine Clinic Attending  Case discussed with Dr. Burns soon after the resident saw the patient.  We reviewed the resident's history and exam and pertinent patient test results.  I agree with the assessment, diagnosis, and plan of care documented in the resident's note. 

## 2016-09-24 ENCOUNTER — Other Ambulatory Visit: Payer: Self-pay | Admitting: Internal Medicine

## 2016-09-25 ENCOUNTER — Other Ambulatory Visit (INDEPENDENT_AMBULATORY_CARE_PROVIDER_SITE_OTHER): Payer: Medicare Other

## 2016-09-25 DIAGNOSIS — Z1211 Encounter for screening for malignant neoplasm of colon: Secondary | ICD-10-CM | POA: Diagnosis not present

## 2016-09-25 LAB — POC HEMOCCULT BLD/STL (HOME/3-CARD/SCREEN)
Card #3 Fecal Occult Blood, POC: NEGATIVE
FECAL OCCULT BLD: NEGATIVE
Fecal Occult Blood, POC: NEGATIVE

## 2016-10-14 DIAGNOSIS — E119 Type 2 diabetes mellitus without complications: Secondary | ICD-10-CM | POA: Diagnosis not present

## 2016-10-14 DIAGNOSIS — Z961 Presence of intraocular lens: Secondary | ICD-10-CM | POA: Diagnosis not present

## 2016-10-14 DIAGNOSIS — H3589 Other specified retinal disorders: Secondary | ICD-10-CM | POA: Diagnosis not present

## 2016-10-14 LAB — HM DIABETES EYE EXAM

## 2016-11-03 NOTE — Assessment & Plan Note (Signed)
Repeat BMET today.  

## 2016-11-03 NOTE — Assessment & Plan Note (Addendum)
BP Readings from Last 3 Encounters:  11/04/16 132/73  08/12/16 124/70  05/06/16 122/64    Lab Results  Component Value Date   NA 136 10/30/2015   K 4.1 10/30/2015   CREATININE 0.91 10/30/2015    Assessment: Blood pressure control:  Controlled Progress toward BP goal:   At goal Comments: On triamterene-HCTZ 75-50 1/2 tablet QD and lisinopril 20 mg QD  Plan: Medications:  Stop lisinopril due to cough. Start losartan 100 mg QD. Continue triamterene-HCTZ. Other plans: Check BMET

## 2016-11-03 NOTE — Assessment & Plan Note (Addendum)
Lab Results  Component Value Date   HGBA1C 7.4 11/04/2016   HGBA1C 7.2 08/12/2016   HGBA1C 7.5 05/06/2016     Assessment: Diabetes control:  Controlled Progress toward A1C goal:   At goal Comments: On Metformin 1000 mg QAM and 500 mg QPM  Plan: Medications:  continue current medications Instruction/counseling given: reminded to bring medications to each visit and discussed the need for weight loss

## 2016-11-03 NOTE — Progress Notes (Signed)
    CC: Follow up for T2DM  HPI: [name] is a 70 y.o. female with PMHx of T2DM, CKD II, HLD, and HTN who presents to the clinic for follow up for T2DM. Please see problem based assessment and plan for more information.  Patient admits to dry cough. denies chest pain or shortness of breath. She is eating and drinking well.   Past Medical History:  Diagnosis Date  . Anemia    -NOS- Positive stool guaiac cards, refuses colonoscopy  . Exposure to second hand smoke   . Hypercalcemia    NI SPEP, UPEP, PTH  . Hyperlipidemia   . Hypertension   . Hypokalemia    2nd diuretics  . Proteinuria   . Shoulder pain    Work Related    Review of Systems: Please see pertinent ROS reviewed in HPI and problem based charting.   Physical Exam: Vitals:   11/04/16 1342  BP: 132/73  Pulse: 90  Temp: 98.7 F (37.1 C)  TempSrc: Oral  SpO2: 99%  Weight: 197 lb 4.8 oz (89.5 kg)  Height: 5' 3.5" (1.613 m)   General: Vital signs reviewed.  Patient is well-developed and well-nourished, in no acute distress and cooperative with exam.  Head: Normocephalic and atraumatic. Neck: Supple, trachea midline, no stridor or carotid bruit present.  Cardiovascular: Irregular rhythm, regular rate, S1 normal, S2 normal, no murmurs, gallops, or rubs. Pulmonary/Chest: Clear to auscultation bilaterally, no wheezes, rales, or rhonchi. Abdominal: Soft, non-tender, non-distended, BS + Extremities: No lower extremity edema bilaterally,  pulses symmetric and intact bilaterally Skin: Warm, dry and intact. No rashes or erythema. Psychiatric: Normal mood and affect. speech and behavior is normal. Cognition and memory are normal.   Assessment & Plan:  See encounters tab for problem based medical decision making. Patient discussed with Dr. Criselda PeachesMullen

## 2016-11-03 NOTE — Assessment & Plan Note (Addendum)
Needs repeat mammogram in April 2018 Stool cards negative x 3 in November 2017, needs repeat in one year

## 2016-11-04 ENCOUNTER — Encounter (INDEPENDENT_AMBULATORY_CARE_PROVIDER_SITE_OTHER): Payer: Self-pay

## 2016-11-04 ENCOUNTER — Ambulatory Visit (INDEPENDENT_AMBULATORY_CARE_PROVIDER_SITE_OTHER): Payer: Medicare Other | Admitting: Internal Medicine

## 2016-11-04 ENCOUNTER — Encounter: Payer: Self-pay | Admitting: Internal Medicine

## 2016-11-04 VITALS — BP 132/73 | HR 90 | Temp 98.7°F | Ht 63.5 in | Wt 197.3 lb

## 2016-11-04 DIAGNOSIS — N182 Chronic kidney disease, stage 2 (mild): Secondary | ICD-10-CM

## 2016-11-04 DIAGNOSIS — I129 Hypertensive chronic kidney disease with stage 1 through stage 4 chronic kidney disease, or unspecified chronic kidney disease: Secondary | ICD-10-CM | POA: Diagnosis not present

## 2016-11-04 DIAGNOSIS — I10 Essential (primary) hypertension: Secondary | ICD-10-CM | POA: Diagnosis present

## 2016-11-04 DIAGNOSIS — R05 Cough: Secondary | ICD-10-CM | POA: Diagnosis not present

## 2016-11-04 DIAGNOSIS — Z79899 Other long term (current) drug therapy: Secondary | ICD-10-CM

## 2016-11-04 DIAGNOSIS — Z0000 Encounter for general adult medical examination without abnormal findings: Secondary | ICD-10-CM

## 2016-11-04 DIAGNOSIS — E1122 Type 2 diabetes mellitus with diabetic chronic kidney disease: Secondary | ICD-10-CM | POA: Diagnosis not present

## 2016-11-04 DIAGNOSIS — Z7984 Long term (current) use of oral hypoglycemic drugs: Secondary | ICD-10-CM | POA: Diagnosis not present

## 2016-11-04 DIAGNOSIS — R058 Other specified cough: Secondary | ICD-10-CM

## 2016-11-04 DIAGNOSIS — E119 Type 2 diabetes mellitus without complications: Secondary | ICD-10-CM

## 2016-11-04 LAB — GLUCOSE, CAPILLARY: GLUCOSE-CAPILLARY: 96 mg/dL (ref 65–99)

## 2016-11-04 LAB — POCT GLYCOSYLATED HEMOGLOBIN (HGB A1C): HEMOGLOBIN A1C: 7.4

## 2016-11-04 MED ORDER — LOSARTAN POTASSIUM 100 MG PO TABS: 100.0000 mg | ORAL_TABLET | Freq: Every day | ORAL | 3 refills | Status: DC

## 2016-11-04 NOTE — Assessment & Plan Note (Signed)
Patient admits to a 2 month history of dry cough which occurs throughout the day. It is not associated with nasal congestion, sore throat, shortness of breath, wheezing, nausea, vomiting, fever or chills. She will drink water or use cough drops which help some. She denies sick contacts. Nothing seems to make it worse. Lungs are CTA b/l on exam.  Assessment: ACEI induced cough  Plan: -Stop lisinopril -Start losartan -Call/return if cough is not resolving

## 2016-11-04 NOTE — Patient Instructions (Signed)
STOP TAKING LISINOPRIL  START TAKING LOSARTAN  CONTINUE ALL OTHER MEDICATIONS THE SAME  FOLLOW UP IN 3 MONTHS  PLEASE LET ME KNOW IF YOUR COUGH DOES NOT IMPROVE

## 2016-11-05 LAB — BMP8+ANION GAP
Anion Gap: 19 mmol/L — ABNORMAL HIGH (ref 10.0–18.0)
BUN / CREAT RATIO: 16 (ref 12–28)
BUN: 16 mg/dL (ref 8–27)
CALCIUM: 10.5 mg/dL — AB (ref 8.7–10.3)
CO2: 23 mmol/L (ref 18–29)
CREATININE: 0.98 mg/dL (ref 0.57–1.00)
Chloride: 98 mmol/L (ref 96–106)
GFR calc non Af Amer: 58 mL/min/{1.73_m2} — ABNORMAL LOW (ref >59)
GFR, EST AFRICAN AMERICAN: 67 mL/min/{1.73_m2} (ref >59)
Glucose: 85 mg/dL (ref 65–99)
Potassium: 4.2 mmol/L (ref 3.5–5.2)
Sodium: 140 mmol/L (ref 134–144)

## 2016-11-05 LAB — CBC
HEMATOCRIT: 36.3 % (ref 34.0–46.6)
HEMOGLOBIN: 11.3 g/dL (ref 11.1–15.9)
MCH: 25.3 pg — ABNORMAL LOW (ref 26.6–33.0)
MCHC: 31.1 g/dL — AB (ref 31.5–35.7)
MCV: 81 fL (ref 79–97)
Platelets: 243 10*3/uL (ref 150–379)
RBC: 4.47 x10E6/uL (ref 3.77–5.28)
RDW: 15.8 % — AB (ref 12.3–15.4)
WBC: 6.2 10*3/uL (ref 3.4–10.8)

## 2016-11-11 NOTE — Progress Notes (Signed)
Internal Medicine Clinic Attending  Case discussed with Dr. Burns soon after the resident saw the patient.  We reviewed the resident's history and exam and pertinent patient test results.  I agree with the assessment, diagnosis, and plan of care documented in the resident's note. 

## 2016-11-19 ENCOUNTER — Encounter: Payer: Self-pay | Admitting: *Deleted

## 2017-02-10 ENCOUNTER — Ambulatory Visit (INDEPENDENT_AMBULATORY_CARE_PROVIDER_SITE_OTHER): Payer: Medicare Other | Admitting: Internal Medicine

## 2017-02-10 ENCOUNTER — Encounter (INDEPENDENT_AMBULATORY_CARE_PROVIDER_SITE_OTHER): Payer: Self-pay

## 2017-02-10 ENCOUNTER — Encounter: Payer: Self-pay | Admitting: Internal Medicine

## 2017-02-10 VITALS — BP 128/68 | HR 86 | Temp 98.2°F | Ht 63.5 in | Wt 195.6 lb

## 2017-02-10 DIAGNOSIS — Z7722 Contact with and (suspected) exposure to environmental tobacco smoke (acute) (chronic): Secondary | ICD-10-CM

## 2017-02-10 DIAGNOSIS — E119 Type 2 diabetes mellitus without complications: Secondary | ICD-10-CM

## 2017-02-10 DIAGNOSIS — Z79899 Other long term (current) drug therapy: Secondary | ICD-10-CM | POA: Diagnosis not present

## 2017-02-10 DIAGNOSIS — Z1231 Encounter for screening mammogram for malignant neoplasm of breast: Secondary | ICD-10-CM

## 2017-02-10 DIAGNOSIS — E785 Hyperlipidemia, unspecified: Secondary | ICD-10-CM

## 2017-02-10 DIAGNOSIS — Z7984 Long term (current) use of oral hypoglycemic drugs: Secondary | ICD-10-CM

## 2017-02-10 DIAGNOSIS — Z0000 Encounter for general adult medical examination without abnormal findings: Secondary | ICD-10-CM

## 2017-02-10 DIAGNOSIS — E7800 Pure hypercholesterolemia, unspecified: Secondary | ICD-10-CM

## 2017-02-10 DIAGNOSIS — I10 Essential (primary) hypertension: Secondary | ICD-10-CM | POA: Diagnosis not present

## 2017-02-10 LAB — GLUCOSE, CAPILLARY: Glucose-Capillary: 85 mg/dL (ref 65–99)

## 2017-02-10 LAB — POCT GLYCOSYLATED HEMOGLOBIN (HGB A1C): HEMOGLOBIN A1C: 7

## 2017-02-10 MED ORDER — LOSARTAN POTASSIUM 100 MG PO TABS: 100.0000 mg | ORAL_TABLET | Freq: Every day | ORAL | 3 refills | Status: DC

## 2017-02-10 NOTE — Patient Instructions (Signed)
Sarah Branch,  It was a pleasure to see you today. You are doing very well. Your blood pressure and diabetes are well controlled. Please continue all of your current medications.  Please take your prescription for losartan 100 mg once a day to the pharmacy you would like to try. If this pharmacy is too expensive are not which is expected, please call our clinic and I will send it to [facility].  Please call that number to schedule your mammogram. I have ordered it in the computer.  Please follow up in 3 months.

## 2017-02-10 NOTE — Assessment & Plan Note (Addendum)
Lab Results  Component Value Date   HGBA1C 7.0 02/10/2017   HGBA1C 7.4 11/04/2016   HGBA1C 7.2 08/12/2016     Assessment: Diabetes control:  controlled Progress toward A1C goal:   at goal Comments: Patient reports compliance with metformin 1000 mg in the morning and 500 mg at night.  Plan: Medications:  continue current medications Instruction/counseling given: discussed the need for weight loss Other plans: Follow up in 3 months

## 2017-02-10 NOTE — Progress Notes (Signed)
     CC: Follow-up for type 2 diabetes and hypertension  HPI: [name] is a 71 y.o. female with PMHx of hypertension, type 2 diabetes, hyperlipidemia who presents to the clinic for follow-up for type 2 diabetes.  Patient denies shortness of breath or chest pain. She reports compliance with her medications. She is eating and drinking well. She denies any feelings of hypoglycemia including shakiness, tremors, lightheadedness, diaphoresis, dizziness, weakness.   Past Medical History:  Diagnosis Date  . Anemia    -NOS- Positive stool guaiac cards, refuses colonoscopy  . Exposure to second hand smoke   . Hypercalcemia    NI SPEP, UPEP, PTH  . Hyperlipidemia   . Hypertension   . Hypokalemia    2nd diuretics  . Proteinuria   . Shoulder pain    Work Related    Review of Systems: Please see pertinent ROS reviewed in HPI and problem based charting.   Physical Exam: Vitals:   02/10/17 1318  BP: 128/68  Pulse: 86  Temp: 98.2 F (36.8 C)  TempSrc: Oral  SpO2: 99%  Weight: 195 lb 9.6 oz (88.7 kg)   General: Vital signs reviewed.  Patient is well-developed and well-nourished, in no acute distress and cooperative with exam.  Cardiovascular: Regular rhythm, regular rate, S1 normal, S2 normal, no murmurs, gallops, or rubs. Pulmonary/Chest: Clear to auscultation bilaterally, no wheezes, rales, or rhonchi. Abdominal: Soft, non-tender, non-distended, BS + Extremities: No lower extremity edema bilaterally,  pulses symmetric and intact bilaterally.  Skin: Warm, dry and intact. No rashes or erythema. Psychiatric: Normal mood and affect. speech and behavior is normal. Cognition and memory are normal.   Assessment & Plan:  See encounters tab for problem based medical decision making. Patient discussed with Dr. Cyndie ChimeGranfortuna

## 2017-02-10 NOTE — Assessment & Plan Note (Addendum)
BP Readings from Last 3 Encounters:  02/10/17 128/68  11/04/16 132/73  08/12/16 124/70    Lab Results  Component Value Date   NA 140 11/04/2016   K 4.2 11/04/2016   CREATININE 0.98 11/04/2016    Assessment: Blood pressure control:  controlled Progress toward BP goal:   at goal Comments: Compliant with losartan 100 mg once a day and triamterene-hydrochlorothiazide 75-50 milligrams half tablet once a day.  Plan: Medications:  continue current medications Other plans: Follow up in 3 months

## 2017-02-11 NOTE — Assessment & Plan Note (Signed)
Colon cancer screening: Fecal occult blood test negative 3 on 11-17, BP November 2018 Breast cancer screening: Mammogram ordered

## 2017-02-11 NOTE — Assessment & Plan Note (Signed)
Patient reports compliance with pravastatin 40 mg once a day. She denies myalgias.  Assessment: Hyperlipidemia  Plan: -Continue pravastatin 40 mg daily

## 2017-02-15 NOTE — Progress Notes (Signed)
Medicine attending: Medical history, presenting problems, physical findings, and medications, reviewed with resident physician Dr Alexa Burns on the day of the patient visit and I concur with her evaluation and management plan. 

## 2017-03-04 ENCOUNTER — Other Ambulatory Visit: Payer: Self-pay

## 2017-03-04 NOTE — Telephone Encounter (Signed)
triamterene-hydrochlorothiazide (MAXZIDE) 75-50 MG tablet, refill request @ [facility], telephone: [phone].

## 2017-03-05 MED ORDER — TRIAMTERENE-HCTZ 75-50 MG PO TABS: 0.5000 | ORAL_TABLET | Freq: Every day | ORAL | 3 refills | Status: DC

## 2017-03-11 ENCOUNTER — Ambulatory Visit
Admission: RE | Admit: 2017-03-11 | Discharge: 2017-03-11 | Disposition: A | Discharge status: Home / Self Care | Payer: Medicare Other | Source: Ambulatory Visit | Attending: Oncology | Admitting: Oncology

## 2017-03-11 DIAGNOSIS — Z1231 Encounter for screening mammogram for malignant neoplasm of breast: Secondary | ICD-10-CM | POA: Diagnosis not present

## 2017-04-21 ENCOUNTER — Encounter: Payer: Self-pay | Admitting: *Deleted

## 2017-05-03 ENCOUNTER — Other Ambulatory Visit: Payer: Self-pay | Admitting: Internal Medicine

## 2017-05-03 DIAGNOSIS — E119 Type 2 diabetes mellitus without complications: Secondary | ICD-10-CM

## 2017-05-03 NOTE — Telephone Encounter (Signed)
NEEDS REFILL METFORMIN 500 MG, [address], [facility] [phone]

## 2017-05-05 MED ORDER — METFORMIN HCL 500 MG PO TABS: ORAL_TABLET | ORAL | 3 refills | Status: DC

## 2017-05-12 ENCOUNTER — Ambulatory Visit (INDEPENDENT_AMBULATORY_CARE_PROVIDER_SITE_OTHER): Payer: Medicare Other | Admitting: Internal Medicine

## 2017-05-12 ENCOUNTER — Encounter: Payer: Self-pay | Admitting: Internal Medicine

## 2017-05-12 VITALS — BP 121/82 | HR 88 | Temp 98.0°F | Ht 63.5 in | Wt 193.7 lb

## 2017-05-12 DIAGNOSIS — Z7984 Long term (current) use of oral hypoglycemic drugs: Secondary | ICD-10-CM

## 2017-05-12 DIAGNOSIS — E1122 Type 2 diabetes mellitus with diabetic chronic kidney disease: Secondary | ICD-10-CM

## 2017-05-12 DIAGNOSIS — I10 Essential (primary) hypertension: Secondary | ICD-10-CM

## 2017-05-12 DIAGNOSIS — N182 Chronic kidney disease, stage 2 (mild): Secondary | ICD-10-CM | POA: Diagnosis not present

## 2017-05-12 DIAGNOSIS — I129 Hypertensive chronic kidney disease with stage 1 through stage 4 chronic kidney disease, or unspecified chronic kidney disease: Secondary | ICD-10-CM | POA: Diagnosis not present

## 2017-05-12 DIAGNOSIS — E785 Hyperlipidemia, unspecified: Secondary | ICD-10-CM

## 2017-05-12 DIAGNOSIS — E119 Type 2 diabetes mellitus without complications: Secondary | ICD-10-CM

## 2017-05-12 DIAGNOSIS — Z79899 Other long term (current) drug therapy: Secondary | ICD-10-CM

## 2017-05-12 DIAGNOSIS — Z0000 Encounter for general adult medical examination without abnormal findings: Secondary | ICD-10-CM

## 2017-05-12 DIAGNOSIS — E7800 Pure hypercholesterolemia, unspecified: Secondary | ICD-10-CM

## 2017-05-12 LAB — POCT GLYCOSYLATED HEMOGLOBIN (HGB A1C): Hemoglobin A1C: 7.2

## 2017-05-12 LAB — GLUCOSE, CAPILLARY: Glucose-Capillary: 93 mg/dL (ref 65–99)

## 2017-05-12 MED ORDER — PRAVASTATIN SODIUM 40 MG PO TABS: 40.0000 mg | ORAL_TABLET | Freq: Every day | ORAL | 3 refills | Status: DC

## 2017-05-12 NOTE — Assessment & Plan Note (Signed)
Breast cancer screening: Mammogram normal in April 2018, repeat in April 2019 DEXA: Normal in July 2017 Colon cancer screening: Fecal occult blood testing negative 3 in November 2017, repeat in November 2018

## 2017-05-12 NOTE — Assessment & Plan Note (Signed)
Patient reports compliance with pravastatin 40 mg daily, moderate intensity statin. She denies myalgias.  Assessment: Hyperlipidemia  Plan: -Continue pravastatin 40 mg daily

## 2017-05-12 NOTE — Assessment & Plan Note (Signed)
Patient has a history of CKD stage II. Last GFR was 67 in December 2017. I would continue to check this on a yearly basis.  Plan: -Repeat basic metabolic panel in December 2018

## 2017-05-12 NOTE — Patient Instructions (Signed)
Ms. Fredricka BonineConnor,  It was a pleasure to see you today. Great job on taking all of your medications! Your hemoglobin A1c is well-controlled at 7.2. Keep up the good work!  Please follow up with us in 3 months.

## 2017-05-12 NOTE — Assessment & Plan Note (Addendum)
BP Readings from Last 3 Encounters:  05/12/17 121/82  02/10/17 128/68  11/04/16 132/73    Lab Results  Component Value Date   NA 140 11/04/2016   K 4.2 11/04/2016   CREATININE 0.98 11/04/2016    Assessment: Blood pressure control:   controlled Progress toward BP goal:   at goal Comments: Patient reports compliance with losartan 100 mg daily and triamterene-hydrochlorothiazide 75-50 milligrams half tablet daily  Plan: Medications:  continue current medications Other plans: Follow up in 3 months, repeat basic metabolic panel in December 2017

## 2017-05-12 NOTE — Progress Notes (Signed)
    CC: Follow-up for diabetes  HPI: [name] is a 71 y.o. female with PMHx of type 2 diabetes, hypertension who presents to the clinic for follow-up for type 2 diabetes.  Patient is eating and drinking well. She denies chest pain or shortness of breath.   Please see problem based assessment and plan for more information of patient's chronic medical conditions.   Past Medical History:  Diagnosis Date  . Anemia    -NOS- Positive stool guaiac cards, refuses colonoscopy  . Exposure to second hand smoke   . Hypercalcemia    NI SPEP, UPEP, PTH  . Hyperlipidemia   . Hypertension   . Hypokalemia    2nd diuretics  . Proteinuria   . Shoulder pain    Work Related    Review of Systems: Please see pertinent ROS reviewed in HPI and problem based charting.   Physical Exam: Blood pressure 121/82, pulse 88, temperature 98 F (36.7 C), temperature source Oral, height 5' 3.5" (1.613 m), weight 193 lb 11.2 oz (87.9 kg), last menstrual period 01/14/1995, SpO2 99 %. General: Vital signs reviewed.  Patient is in no acute distress and cooperative with exam.  Eyes: PERRL, conjunctivae normal Ears, Nose, Throat, and Mouth: Normal posterior oropharynx. Moist mucous membranes.  Cardiovascular: Irregular rhythm, regular rate. Sinus pause,  no murmurs, gallops, or rubs. No lower extremity edema bilaterally. Pulmonary: Clear to auscultation bilaterally, no wheezes, rales, or rhonchi. No accessory muscle use. Gastrointestinal: Soft, non-tender, non-distended, BS + Neurologic: Awake and alert. Answers all questions appropriately. Moves all extremities. Skin: Warm, dry and intact. Psychiatric: Normal mood and affect. speech and behavior is normal.    Assessment & Plan:  See encounters tab for problem based medical decision making. Patient discussed with Dr. Oswaldo DoneVincent.

## 2017-05-12 NOTE — Assessment & Plan Note (Addendum)
Lab Results  Component Value Date   HGBA1C 7.2 05/12/2017   HGBA1C 7.0 02/10/2017   HGBA1C 7.4 11/04/2016     Assessment: Diabetes control:  controlled Progress toward A1C goal:   at goal Comments: Patient reports compliance with metformin 1000 mg in the morning and 500 milligrams at night.  Plan: Medications:  continue current medications Instruction/counseling given: reminded to bring blood glucose meter & log to each visit and reminded to bring medications to each visit Other plans: Follow up in 3 months

## 2017-05-13 NOTE — Progress Notes (Signed)
Internal Medicine Clinic Attending  Case discussed with Dr. Burns at the time of the visit.  We reviewed the resident's history and exam and pertinent patient test results.  I agree with the assessment, diagnosis, and plan of care documented in the resident's note.  

## 2017-07-28 ENCOUNTER — Ambulatory Visit (INDEPENDENT_AMBULATORY_CARE_PROVIDER_SITE_OTHER): Payer: Medicare Other | Admitting: Internal Medicine

## 2017-07-28 ENCOUNTER — Ambulatory Visit (HOSPITAL_COMMUNITY)
Admission: RE | Admit: 2017-07-28 | Discharge: 2017-07-28 | Disposition: A | Discharge status: Home / Self Care | Payer: Medicare Other | Source: Ambulatory Visit | Attending: Family Medicine | Admitting: Family Medicine

## 2017-07-28 ENCOUNTER — Encounter: Payer: Self-pay | Admitting: Internal Medicine

## 2017-07-28 VITALS — BP 142/71 | HR 95 | Temp 98.3°F | Wt 194.6 lb

## 2017-07-28 DIAGNOSIS — Z0000 Encounter for general adult medical examination without abnormal findings: Secondary | ICD-10-CM

## 2017-07-28 DIAGNOSIS — Z2821 Immunization not carried out because of patient refusal: Secondary | ICD-10-CM

## 2017-07-28 DIAGNOSIS — Z7982 Long term (current) use of aspirin: Secondary | ICD-10-CM | POA: Diagnosis not present

## 2017-07-28 DIAGNOSIS — Z7984 Long term (current) use of oral hypoglycemic drugs: Secondary | ICD-10-CM | POA: Diagnosis not present

## 2017-07-28 DIAGNOSIS — E1122 Type 2 diabetes mellitus with diabetic chronic kidney disease: Secondary | ICD-10-CM | POA: Diagnosis not present

## 2017-07-28 DIAGNOSIS — I499 Cardiac arrhythmia, unspecified: Secondary | ICD-10-CM

## 2017-07-28 DIAGNOSIS — I491 Atrial premature depolarization: Secondary | ICD-10-CM | POA: Diagnosis not present

## 2017-07-28 DIAGNOSIS — I498 Other specified cardiac arrhythmias: Secondary | ICD-10-CM | POA: Insufficient documentation

## 2017-07-28 DIAGNOSIS — E7800 Pure hypercholesterolemia, unspecified: Secondary | ICD-10-CM

## 2017-07-28 DIAGNOSIS — I129 Hypertensive chronic kidney disease with stage 1 through stage 4 chronic kidney disease, or unspecified chronic kidney disease: Secondary | ICD-10-CM

## 2017-07-28 DIAGNOSIS — R0602 Shortness of breath: Secondary | ICD-10-CM

## 2017-07-28 DIAGNOSIS — Z79899 Other long term (current) drug therapy: Secondary | ICD-10-CM | POA: Diagnosis not present

## 2017-07-28 DIAGNOSIS — N182 Chronic kidney disease, stage 2 (mild): Secondary | ICD-10-CM

## 2017-07-28 DIAGNOSIS — Z7722 Contact with and (suspected) exposure to environmental tobacco smoke (acute) (chronic): Secondary | ICD-10-CM | POA: Diagnosis not present

## 2017-07-28 DIAGNOSIS — R5383 Other fatigue: Secondary | ICD-10-CM

## 2017-07-28 DIAGNOSIS — I10 Essential (primary) hypertension: Secondary | ICD-10-CM

## 2017-07-28 DIAGNOSIS — E119 Type 2 diabetes mellitus without complications: Secondary | ICD-10-CM

## 2017-07-28 LAB — GLUCOSE, CAPILLARY: GLUCOSE-CAPILLARY: 98 mg/dL (ref 65–99)

## 2017-07-28 LAB — POCT GLYCOSYLATED HEMOGLOBIN (HGB A1C): Hemoglobin A1C: 7.1

## 2017-07-28 MED ORDER — PRAVASTATIN SODIUM 40 MG PO TABS: 40.0000 mg | ORAL_TABLET | Freq: Every day | ORAL | 3 refills | Status: DC

## 2017-07-28 NOTE — Progress Notes (Signed)
   CC: "Routine visit"  HPI:  [name] is a 71 y.o. who presented today for a routine evaluation of her diabetes, hypertension, and other chronic medical conditions. Patient states that she has been compliant with her current medication regimen which includes aspirin, losartan 100 mg, metformin 500 mg 2 times daily, lovastatin 40 mg daily, and triamterene/hydrochlorothiazide combo tablet at 75/50 mg. Patient continues to deny muscle aches or cramps. Patient denied acute concerns today insisting that she was here only for a routine visit.  Patient is refusing flu shot today, she states that she knows many people who've received a flu shot and subsequently became ill.  Past Medical History:  Diagnosis Date  . Anemia    -NOS- Positive stool guaiac cards, refuses colonoscopy  . Exposure to second hand smoke   . Hypercalcemia    NI SPEP, UPEP, PTH  . Hyperlipidemia   . Hypertension   . Hypokalemia    2nd diuretics  . Proteinuria   . 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 Negative for chills, diaphoresis and fever.  HENT: Negative for ear pain and sinus pain.   Eyes: Negative for redness.  Respiratory: Positive for shortness of breath (Sort of, the patient says. If she is very active). Negative for cough.   Cardiovascular: Positive for leg swelling (Occasionally, if she stands for significant periods of time). Negative for chest pain.  Gastrointestinal: Negative for constipation, diarrhea, nausea and vomiting.  Genitourinary: Positive for frequency (Patient has been using the restroom more frequently cerebration.). Negative for dysuria, flank pain, hematuria and urgency.  Musculoskeletal: Negative for myalgias.  Neurological: Negative for dizziness, weakness and headaches.  Endo/Heme/Allergies: Positive for polydipsia  (Patient states that she consumes a large amount of water on a regular basis, but this is not new for her).  Psychiatric/Behavioral: The patient is not nervous/anxious.    Physical Exam:  Vitals:   07/28/17 1325 07/28/17 1409  BP: (!) 155/83 (!) 142/71  Pulse: 95   Temp: 98.3 F (36.8 C)   TempSrc: Oral   SpO2: 98%   Weight: 194 lb 9.6 oz (88.3 kg)    Physical Exam  Constitutional: She is oriented to person, place, and time. She appears well-nourished.  Eyes: EOM are normal.  Neck: Normal range of motion. Neck supple. No JVD present.  Cardiovascular: Normal rate.  An irregular rhythm present.  No murmur heard. Pulmonary/Chest: Effort normal and breath sounds normal. No respiratory distress. She has no wheezes.  Abdominal: Soft. Bowel sounds are normal. She exhibits no distension. There is no tenderness.  Musculoskeletal: She exhibits no edema or tenderness.  Neurological: She is alert and oriented to person, place, and time.  Skin: Skin is warm. Capillary refill takes less than 2 seconds. She is not diaphoretic.  Psychiatric: She has a normal mood and affect.    Assessment & Plan:   See Encounters Tab for problem based charting.  Patient seen with Dr. Oswaldo DoneVincent

## 2017-07-28 NOTE — Assessment & Plan Note (Addendum)
Assessment: Patient observed to have an irregular heart rhythm on physical exam. EKG performed which indicated an atrial arrhythmia, not specifically A-fib, most likely multifocal atrial arrhythmia.   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 She does not exercise on a regular basis but stated that with walking, she often takes multiple breaks to if doing so for significant distances. Given the arrhythmia and history, a 2D echo was ordered to evaluate for structural abnormality.   Echo ordered. Will follow-up with the next appointment or sooner if indicated.

## 2017-07-28 NOTE — Assessment & Plan Note (Addendum)
Assessment: Will complete labs in December on her next visit one year from her previous evaluation.   Plan:  Continue adequately treating the patient's diabetes.

## 2017-07-28 NOTE — Assessment & Plan Note (Signed)
Patient refused flu vaccine today. Patient was informed of the benefits and risks of the flu and decided to think it over.

## 2017-07-28 NOTE — Assessment & Plan Note (Addendum)
A: Patient's pressure was initially 155/83, upon rechecking it was observed to be 142/71.  P: Continue current medications, Losartan  daily and triamterene-HCTZ combo 0.5 of a 75-50 tablet.

## 2017-07-28 NOTE — Progress Notes (Signed)
Internal Medicine Clinic Attending  I saw and evaluated the patient.  I personally confirmed the key portions of the history and exam documented by Dr. Crista ElliotHarbrecht and I reviewed pertinent patient test results.  The assessment, diagnosis, and plan were formulated together and I agree with the documentation in the resident's note.  As a correction, chief complaint is follow up for diabetes.

## 2017-07-28 NOTE — Assessment & Plan Note (Addendum)
Assessment: Hgb A1c 7.1 previously. Will recheck today and make adjustments to metformin as indicated.   Plan: Hgb A1c 7.1 again today. Will continue metformin at her current dose. Patient further advised to decrease her intake of soda, sweat tea, juice, candy, and other high sugar based foods. Patient laughed response, stating that she likes a sugary food or drink after her meals. Patient advised that we would prefer to keep her off of insulin as long as possible which is only accomplished by better dietary control and continued medication compliance.
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
## 2017-08-06 ENCOUNTER — Ambulatory Visit (HOSPITAL_COMMUNITY)
Admission: RE | Admit: 2017-08-06 | Discharge: 2017-08-06 | Disposition: A | Discharge status: Home / Self Care | Payer: Medicare Other | Source: Ambulatory Visit | Attending: Internal Medicine | Admitting: Internal Medicine

## 2017-08-06 DIAGNOSIS — E785 Hyperlipidemia, unspecified: Secondary | ICD-10-CM | POA: Diagnosis not present

## 2017-08-06 DIAGNOSIS — R5383 Other fatigue: Secondary | ICD-10-CM | POA: Insufficient documentation

## 2017-08-06 DIAGNOSIS — I498 Other specified cardiac arrhythmias: Secondary | ICD-10-CM | POA: Diagnosis not present

## 2017-08-06 DIAGNOSIS — E119 Type 2 diabetes mellitus without complications: Secondary | ICD-10-CM | POA: Diagnosis not present

## 2017-08-06 DIAGNOSIS — I351 Nonrheumatic aortic (valve) insufficiency: Secondary | ICD-10-CM | POA: Diagnosis not present

## 2017-08-06 DIAGNOSIS — R0602 Shortness of breath: Secondary | ICD-10-CM

## 2017-08-06 DIAGNOSIS — I4891 Unspecified atrial fibrillation: Secondary | ICD-10-CM | POA: Insufficient documentation

## 2017-08-06 DIAGNOSIS — I10 Essential (primary) hypertension: Secondary | ICD-10-CM | POA: Diagnosis not present

## 2017-08-06 LAB — ECHOCARDIOGRAM COMPLETE
CHL CUP TV REG PEAK VELOCITY: 259 cm/s
EERAT: 6.33
EWDT: 197 ms
FS: 30 % (ref 28–44)
IV/PV OW: 0.87
LA ID, A-P, ES: 35 mm
LA vol index: 17.9 mL/m2
LADIAMINDEX: 1.82 cm/m2
LAVOL: 34.3 mL
LAVOLA4C: 33.8 mL
LEFT ATRIUM END SYS DIAM: 35 mm
LV E/e'average: 6.33
LVEEMED: 6.33
LVELAT: 10.5 cm/s
LVOT VTI: 17.8 cm
LVOT area: 2.54 cm2
LVOT peak grad rest: 3 mmHg
LVOT peak vel: 87.9 cm/s
LVOTD: 18 mm
LVOTSV: 45 mL
Lateral S' vel: 11.7 cm/s
MV Dec: 197
MV pk E vel: 66.5 m/s
MVPKAVEL: 67.9 m/s
P 1/2 time: 571 ms
PW: 12.1 mm — AB (ref 0.6–1.1)
RV sys press: 30 mmHg
TAPSE: 20.3 mm
TDI e' lateral: 10.5
TDI e' medial: 7.32
TRMAXVEL: 259 cm/s

## 2017-08-06 NOTE — Progress Notes (Signed)
  Echocardiogram 2D Echocardiogram has been performed.  Sarah Branch 08/06/2017, 10:02 AM

## 2017-08-10 ENCOUNTER — Other Ambulatory Visit: Payer: Self-pay | Admitting: Internal Medicine

## 2017-08-10 NOTE — Telephone Encounter (Signed)
Refill Request-Please send to [facility] per pt request   Pravastatin

## 2017-08-10 NOTE — Telephone Encounter (Addendum)
Call made to patient to confirm pharmacy-pt no longer be [facility].  I instructed Sarah Branch to contact [facility] to have rx transferred as it had additional refills left on rx.  No further action needed.  Phone call compete.Kingsley Spittle Cassady9/25/201810:56 AM

## 2017-10-14 DIAGNOSIS — E119 Type 2 diabetes mellitus without complications: Secondary | ICD-10-CM | POA: Diagnosis not present

## 2017-10-14 DIAGNOSIS — Z961 Presence of intraocular lens: Secondary | ICD-10-CM | POA: Diagnosis not present

## 2017-10-14 LAB — HM DIABETES EYE EXAM

## 2017-10-27 ENCOUNTER — Encounter: Payer: Self-pay | Admitting: Internal Medicine

## 2017-10-27 ENCOUNTER — Ambulatory Visit (INDEPENDENT_AMBULATORY_CARE_PROVIDER_SITE_OTHER): Payer: Medicare Other | Admitting: Internal Medicine

## 2017-10-27 VITALS — BP 150/82 | HR 89 | Temp 98.6°F | Ht 63.5 in | Wt 196.1 lb

## 2017-10-27 DIAGNOSIS — I129 Hypertensive chronic kidney disease with stage 1 through stage 4 chronic kidney disease, or unspecified chronic kidney disease: Secondary | ICD-10-CM

## 2017-10-27 DIAGNOSIS — Z7984 Long term (current) use of oral hypoglycemic drugs: Secondary | ICD-10-CM | POA: Diagnosis not present

## 2017-10-27 DIAGNOSIS — N182 Chronic kidney disease, stage 2 (mild): Secondary | ICD-10-CM

## 2017-10-27 DIAGNOSIS — E1122 Type 2 diabetes mellitus with diabetic chronic kidney disease: Secondary | ICD-10-CM | POA: Diagnosis not present

## 2017-10-27 DIAGNOSIS — Z7722 Contact with and (suspected) exposure to environmental tobacco smoke (acute) (chronic): Secondary | ICD-10-CM

## 2017-10-27 DIAGNOSIS — Z79899 Other long term (current) drug therapy: Secondary | ICD-10-CM | POA: Diagnosis not present

## 2017-10-27 DIAGNOSIS — I10 Essential (primary) hypertension: Secondary | ICD-10-CM

## 2017-10-27 DIAGNOSIS — E119 Type 2 diabetes mellitus without complications: Secondary | ICD-10-CM | POA: Diagnosis not present

## 2017-10-27 LAB — GLUCOSE, CAPILLARY: Glucose-Capillary: 120 mg/dL — ABNORMAL HIGH (ref 65–99)

## 2017-10-27 LAB — POCT GLYCOSYLATED HEMOGLOBIN (HGB A1C): Hemoglobin A1C: 7.5

## 2017-10-27 MED ORDER — LOSARTAN POTASSIUM 100 MG PO TABS: 100.0000 mg | ORAL_TABLET | Freq: Every day | ORAL | 3 refills | Status: DC

## 2017-10-27 NOTE — Progress Notes (Signed)
   CC: diabetes evaluation  HPI:  Sarah Branch is a 71 y.o. female who presents today for routine evaluation for diabetes, hypertension, chronic kidney disease stage II.  She denies acute concerns since her previous recently in September 20 7 years.  Prostate cancer, was not unexpected, difficult for her.  Patient denied depression, denied decreased sleep, decreased or increased appetite, denied interest in activities or energy.  Patient denied nausea, vomiting, diarrhea, constipation, vomiting, chest pain, visual changes, headache, pain, numbness or tingling, dysuria, other complaints at this time.  See problem based plan for additional information.    Diabetes Mellitus II: Assessment: Last A1c was 7.1 in September of this year. Prior to that it was 7.2. We will monitor this again today and continue the metformin along with dietary modifications if stable.   Plan: Continue metformin 500mg  two time daily.  Continue dietary modifications and exercise  HgbA1c 7.5 today. Slightly elevated but patient stated she had been eating a lot of sweets over Thanksgiving and the following week. Although unlikely to have a significant impact on her A1c, we will defer changes to her regimen until her next visit as she prefers to wait as well.   Hypertension: Assessment: BP today was 158/66 despite having walked a distance into the building. Patient was assessed for medication compliance and discuss adding   Recheck BP prior to leaving today. 150/82. Patient desired to begin exercise prior to adding an additional BP medication at this time. Will assess at next visit and adjust medications as indicated.  Plan: Continue Losartan 100mg  daily refilled today. Continue triamterene/HCTZ combo tablet. BMP for renal function   Chronic kidney disease: Assessment: Patient states that she is not had any urinary issues.  Denies discolored urine, dysuria, or decreased output.  Most recent GFR 10/2016 was 67.   Given her controlled hypertension concern for worsening renal failure.  Plan: Ordered BMP, will notify patient of results when available for repeat GFR. Continue tight glycemic and blood pressure control to decrease renal injury   Past Medical History:  Diagnosis Date  . Anemia    -NOS- Positive stool guaiac cards, refuses colonoscopy  . Exposure to second hand smoke   . Hypercalcemia    NI SPEP, UPEP, PTH  . Hyperlipidemia   . Hypertension   . Hypokalemia    2nd diuretics  . Proteinuria   . Shoulder pain    Work Related   Review of Systems:  ROS negative except as per HPI and/or Assessment and Plan.  Physical Exam:  Vitals:   10/27/17 1319 10/27/17 1349  BP: (!) 158/66 (!) 150/82  Pulse: 91 89  Temp: 98.6 F (37 C)   TempSrc: Oral   SpO2: 99%   Weight: 196 lb 1.6 oz (89 kg)   Height: 5' 3.5" (1.613 m)    Physical Exam  Vitals reviewed. Constitutional: Patient is well-developed, well-nourished, no acute distress Cardiovascular: Regular rate and rhythm, no murmur auscultated at this time. Pulmonary: Normal effort without wheezes or crackles Abdominal: Soft, nontender, nondistended, Musculoskeletal: Absent pedal edema, no tenderness to distal extremities Patient is alert and oriented  Assessment & Plan:   See Encounters Tab for problem based charting.  Patient seen with Dr. Cyndie ChimeGranfortuna

## 2017-10-27 NOTE — Assessment & Plan Note (Signed)
   Chronic kidney disease: Assessment: Patient states that she is not had any urinary issues.  Denies discolored urine, dysuria, or decreased output.  Most recent GFR 10/2016 was 67.  Given her controlled hypertension concern for worsening renal failure.  Plan: Ordered BMP, will notify patient of results when available for repeat GFR. Continue tight glycemic and blood pressure control to decrease renal injury

## 2017-10-27 NOTE — Progress Notes (Signed)
Medicine attending: I personally interviewed and briefly examined this patient on the day of the patient visit and reviewed pertinent clinical ,laboratory data  with resident physician Dr. Raye SorrowLarry Harbrecht and we discussed a management plan. Diabetes and hypertension well controlled. No new med changes.

## 2017-10-27 NOTE — Assessment & Plan Note (Signed)
Hypertension: Assessment: BP today was 158/66 despite having walked a distance into the building. Patient was assessed for medication compliance and discuss adding   Recheck BP prior to leaving today. 150/82. Patient desired to begin exercise prior to adding an additional BP medication at this time. Will assess at next visit and adjust medications as indicated.  Plan: Continue Losartan 100mg  daily refilled today. Continue triamterene/HCTZ combo tablet. BMP for renal function

## 2017-10-27 NOTE — Patient Instructions (Signed)
FOLLOW-UP INSTRUCTIONS When: 4 months  For: routine visit What to bring: all of your medications  [facility].  If at any time you need any refills or have any questions please call our clinic.

## 2017-10-27 NOTE — Assessment & Plan Note (Signed)
Diabetes Mellitus II: Assessment: Last A1c was 7.1 in September of this year. Prior to that it was 7.2. We will monitor this again today and continue the metformin along with dietary modifications if stable.   Plan: Continue metformin 500mg  two time daily.  Continue dietary modifications and exercise  HgbA1c 7.5 today. Slightly elevated but patient stated she had been eating a lot of sweets over Thanksgiving and the following week. Although unlikely to have a significant impact on her A1c, we will defer changes to her regimen until her next visit as she prefers to wait as well.

## 2017-10-28 LAB — BMP8+ANION GAP
ANION GAP: 16 mmol/L (ref 10.0–18.0)
BUN/Creatinine Ratio: 15 (ref 12–28)
BUN: 15 mg/dL (ref 8–27)
CALCIUM: 10.8 mg/dL — AB (ref 8.7–10.3)
CO2: 27 mmol/L (ref 20–29)
CREATININE: 0.99 mg/dL (ref 0.57–1.00)
Chloride: 96 mmol/L (ref 96–106)
GFR calc Af Amer: 66 mL/min/{1.73_m2} (ref >59)
GFR, EST NON AFRICAN AMERICAN: 58 mL/min/{1.73_m2} — AB (ref >59)
Glucose: 114 mg/dL — ABNORMAL HIGH (ref 65–99)
Potassium: 3.7 mmol/L (ref 3.5–5.2)
Sodium: 139 mmol/L (ref 134–144)

## 2017-10-29 ENCOUNTER — Encounter: Payer: Self-pay | Admitting: *Deleted

## 2018-02-23 ENCOUNTER — Other Ambulatory Visit: Payer: Self-pay

## 2018-02-23 ENCOUNTER — Encounter: Payer: Self-pay | Admitting: Internal Medicine

## 2018-02-23 ENCOUNTER — Encounter (INDEPENDENT_AMBULATORY_CARE_PROVIDER_SITE_OTHER): Payer: Self-pay

## 2018-02-23 ENCOUNTER — Ambulatory Visit (INDEPENDENT_AMBULATORY_CARE_PROVIDER_SITE_OTHER): Payer: Medicare Other | Admitting: Internal Medicine

## 2018-02-23 VITALS — BP 154/91 | HR 87 | Temp 98.4°F | Ht 63.5 in | Wt 189.1 lb

## 2018-02-23 DIAGNOSIS — R42 Dizziness and giddiness: Secondary | ICD-10-CM

## 2018-02-23 DIAGNOSIS — Z79899 Other long term (current) drug therapy: Secondary | ICD-10-CM

## 2018-02-23 DIAGNOSIS — Z0000 Encounter for general adult medical examination without abnormal findings: Secondary | ICD-10-CM

## 2018-02-23 DIAGNOSIS — E785 Hyperlipidemia, unspecified: Secondary | ICD-10-CM

## 2018-02-23 DIAGNOSIS — E7800 Pure hypercholesterolemia, unspecified: Secondary | ICD-10-CM

## 2018-02-23 DIAGNOSIS — Z7722 Contact with and (suspected) exposure to environmental tobacco smoke (acute) (chronic): Secondary | ICD-10-CM

## 2018-02-23 DIAGNOSIS — Z7984 Long term (current) use of oral hypoglycemic drugs: Secondary | ICD-10-CM

## 2018-02-23 DIAGNOSIS — I10 Essential (primary) hypertension: Secondary | ICD-10-CM

## 2018-02-23 DIAGNOSIS — E119 Type 2 diabetes mellitus without complications: Secondary | ICD-10-CM

## 2018-02-23 HISTORY — DX: Dizziness and giddiness: R42

## 2018-02-23 LAB — POCT GLYCOSYLATED HEMOGLOBIN (HGB A1C): Hemoglobin A1C: 7.1

## 2018-02-23 LAB — GLUCOSE, CAPILLARY: GLUCOSE-CAPILLARY: 86 mg/dL (ref 65–99)

## 2018-02-23 MED ORDER — METFORMIN HCL 500 MG PO TABS: ORAL_TABLET | ORAL | 3 refills | Status: DC

## 2018-02-23 MED ORDER — TRIAMTERENE-HCTZ 75-50 MG PO TABS: 0.5000 | ORAL_TABLET | Freq: Every day | ORAL | 3 refills | Status: DC

## 2018-02-23 MED ORDER — OLMESARTAN MEDOXOMIL 40 MG PO TABS: 40.0000 mg | ORAL_TABLET | Freq: Every day | ORAL | 3 refills | Status: DC

## 2018-02-23 NOTE — Assessment & Plan Note (Signed)
  Diabetes: A: Last HgbA1c of 7.5 on 10/27/2017, prior to that 7.1.  And exercise improvements.  She is to be congratulated on maintaining her hemoglobin A1c at this level on only metformin. I advised her that her current regimen appears to be effective and encouraged her to continue medication compliance and dietary P:  HgbA1c for this visit-7.1 Diabetic foot exam-completed Refilled metformin

## 2018-02-23 NOTE — Assessment & Plan Note (Signed)
HTN: A: Appears well controlled at 135/68. Goal of 130/90, given her progress since her last visit, I will encourage her to continue her current regimen at this time.   P: We will alternate her losartan for olmesartan at her request given the recent recall.  Initiate olmesartan 40 mg daily

## 2018-02-23 NOTE — Patient Instructions (Addendum)
FOLLOW-UP INSTRUCTIONS When: 3 months For:  Routine evaluation What to bring: all of your medications  Thank you for your visit to the [facility].  I have discontiued your Losartan 100mg  in favor of Olmesartan 40mg . Although the numbers are different, the ability to lower your blood pressure is equal.   If you  If you have any questions or concerns please be sure to call us.

## 2018-02-23 NOTE — Assessment & Plan Note (Signed)
Health maintenance Lipid Panel ordered Diabetic foot exam completed

## 2018-02-23 NOTE — Assessment & Plan Note (Signed)
  Dizziness: A: Denied CP, nausea, vomiting, recent URI, headache, weakness.  She stated that this visit this initially occurred while lying flat when she rolled over in her bed.  This work.  Of dizziness which self resolved.  This is very intermittent and has occurred only 3 or 4 times in the past 2 weeks when it began.  She denies associated palpitations, or other concerning symptoms with this dizziness.  It occurred one time while standing in [employer] when she looked up but rapidly resolved shortly thereafter within approximately 30-40 seconds.  She does not drive. Plan: We will monitor this for now given that there were not no concerning red flag symptoms.  Recent echocardiogram September of last year failed to  reveal cardiac process that could be contributing to the dizziness.  She is advised to call our clinic in 2-3 weeks if this discomfort worsens or fails to resolve.  In addition she was given information regarding symptoms to be alert for that would indicate that she should contact medical personnel. Clinic performed orthostatic vital signs which were negative.  I feel that this is most likely secondary to vestibular neuritis peripheral restless and feels that she worsens she would warrant additional evaluation

## 2018-02-23 NOTE — Assessment & Plan Note (Signed)
Order We will follow-up lipid panel.  And adjust treatment if indicated

## 2018-02-23 NOTE — Progress Notes (Signed)
   CC: Evaluation of her blood pressure and diabetes  HPI:[name] is a 72 y.o. female who presents for evaluation of her HTN, diabetes and dizziness.  Please see problem based assessment and plan for additional details.   Dizziness: A: Denied CP, nausea, vomiting, recent URI, headache, weakness.  She stated that this visit this initially occurred while lying flat when she rolled over in her bed.  This work.  Of dizziness which self resolved.  This is very intermittent and has occurred only 3 or 4 times in the past 2 weeks when it began.  She denies associated palpitations, or other concerning symptoms with this dizziness.  It occurred one time while standing in [employer] when she looked up but rapidly resolved shortly thereafter within approximately 30-40 seconds.  She does not drive. Plan: We will monitor this for now given that there were not no concerning red flag symptoms.  Recent echocardiogram September of last year failed to  reveal cardiac process that could be contributing to the dizziness.  She is advised to call our clinic in 2-3 weeks if this discomfort worsens or fails to resolve.  In addition she was given information regarding symptoms to be alert for that would indicate that she should contact medical personnel. Clinic performed orthostatic vital signs which were negative.  I feel that this is most likely secondary to vestibular neuritis peripheral restless and feels that she worsens she would warrant additional evaluation   HTN: A: Appears well controlled at 135/68. Goal of 130/90, given her progress since her last visit, I will encourage her to continue her current regimen at this time.   P: We will alternate her losartan for olmesartan at her request given the recent recall.  Initiate olmesartan 40 mg daily   Diabetes: A: Last HgbA1c of 7.5 on 10/27/2017, prior to that 7.1.  And exercise improvements.  She is to be congratulated on maintaining her hemoglobin A1c at  this level on only metformin. I advised her that her current regimen appears to be effective and encouraged her to continue medication compliance and dietary P:  HgbA1c for this visit-7.1 Diabetic foot exam-completed Refilled metformin  Health maintenance Lipid Panel ordered Diabetic foot exam completed     Past Medical History:  Diagnosis Date  . Anemia    -NOS- Positive stool guaiac cards, refuses colonoscopy  . Exposure to second hand smoke   . Hypercalcemia    NI SPEP, UPEP, PTH  . Hyperlipidemia   . Hypertension   . Hypokalemia    2nd diuretics  . Proteinuria   . Shoulder pain    Work Related   Review of Systems:  ROS negative except as per HPI.  Physical Exam:  Vitals:   02/23/18 1312  BP: 135/68  Pulse: 90  Temp: 98.4 F (36.9 C)  TempSrc: Oral  SpO2: 100%  Weight: 189 lb 1.6 oz (85.8 kg)  Height: 5' 3.5" (1.613 m)   General: No acute distress, afebrile, nondiaphoretic, conversant Cardiovascular: RRR no murmurs rubs or gallops auscultated on this exam Pulmonary: Bilateral lung fields are clear to auscultation GI: Abdomen is soft, nontender, nondistended, with normal bowel sounds Muscular skeletal: Bilateral distal extremities nontender, nonedematous  Assessment & Plan:   See Encounters Tab for problem based charting.  Patient discussed with Dr. Cyndie ChimeGranfortuna

## 2018-02-23 NOTE — Progress Notes (Signed)
Medicine attending: Medical history, presenting problems, physical findings, and medications, reviewed with resident physician Dr Lanelle BalLawrence Harbrecht on the day of the patient visit and I concur with his evaluation and management plan. No red flag signs. Conservative management. Call if symptoms persist or worsen. Suspect transient dizziness from prolonged standing/venous insufficiency.

## 2018-02-24 LAB — LIPID PANEL
Chol/HDL Ratio: 3.2 ratio (ref 0.0–4.4)
Cholesterol, Total: 161 mg/dL (ref 100–199)
HDL: 51 mg/dL (ref >39)
LDL Calculated: 79 mg/dL (ref 0–99)
Triglycerides: 157 mg/dL — ABNORMAL HIGH (ref 0–149)
VLDL Cholesterol Cal: 31 mg/dL (ref 5–40)

## 2018-04-24 ENCOUNTER — Other Ambulatory Visit: Payer: Self-pay

## 2018-04-24 ENCOUNTER — Encounter (HOSPITAL_COMMUNITY): Payer: Self-pay | Admitting: Emergency Medicine

## 2018-04-24 ENCOUNTER — Emergency Department (HOSPITAL_COMMUNITY)
Admission: EM | Admit: 2018-04-24 | Discharge: 2018-04-24 | Disposition: A | Discharge status: Home / Self Care | Payer: Medicare Other | Attending: Emergency Medicine | Admitting: Emergency Medicine

## 2018-04-24 DIAGNOSIS — Z79899 Other long term (current) drug therapy: Secondary | ICD-10-CM | POA: Diagnosis not present

## 2018-04-24 DIAGNOSIS — Z7982 Long term (current) use of aspirin: Secondary | ICD-10-CM | POA: Insufficient documentation

## 2018-04-24 DIAGNOSIS — R42 Dizziness and giddiness: Secondary | ICD-10-CM | POA: Diagnosis not present

## 2018-04-24 DIAGNOSIS — N182 Chronic kidney disease, stage 2 (mild): Secondary | ICD-10-CM | POA: Insufficient documentation

## 2018-04-24 DIAGNOSIS — E1122 Type 2 diabetes mellitus with diabetic chronic kidney disease: Secondary | ICD-10-CM | POA: Insufficient documentation

## 2018-04-24 DIAGNOSIS — I129 Hypertensive chronic kidney disease with stage 1 through stage 4 chronic kidney disease, or unspecified chronic kidney disease: Secondary | ICD-10-CM | POA: Insufficient documentation

## 2018-04-24 DIAGNOSIS — Z7984 Long term (current) use of oral hypoglycemic drugs: Secondary | ICD-10-CM | POA: Diagnosis not present

## 2018-04-24 LAB — CBG MONITORING, ED: Glucose-Capillary: 108 mg/dL — ABNORMAL HIGH (ref 65–99)

## 2018-04-24 MED ORDER — MECLIZINE HCL 12.5 MG PO TABS: 12.5000 mg | ORAL_TABLET | Freq: Two times a day (BID) | ORAL | 0 refills | Status: DC

## 2018-04-24 NOTE — ED Provider Notes (Signed)
Patient awakened this morning 6 AM complaining of sensation of room spinning.  She denies visual changes denies focal numbness or weakness denies headache denies difficulty speaking.  Symptoms resolved spontaneously after 1 hour.  She had similar symptoms multiple times in the past. she is presently asymptomatic without treatment.  On exam alert no distress HEENT exam no facial asymmetry neck supple no bruit heart regular rate and rhythm lungs clear to auscultation abdomen obese, nontender.  Extremities without edema.  Skin warm dry neurologic Glasgow Coma Score 15 cranial nerves II through XII grossly intact finger-to-nose normal Romberg normal pronator drift normal gait normal.  Patient suffered vertigo, most likely peripheral in etiology.  She is advised to take her blood pressure medicine upon arrival home.  Lives 15 minutes away from here.   Doug SouJacubowitz, Adain Geurin, MD 04/24/18 [phone]

## 2018-04-24 NOTE — ED Notes (Signed)
ED Provider at bedside. 

## 2018-04-24 NOTE — ED Provider Notes (Signed)
[facility] EMERGENCY DEPARTMENT Provider Note   CSN: [id number] Arrival date & time: 04/24/18  4098     History   Chief Complaint Chief Complaint  Patient presents with  . Hypertension    HPI Sarah Branch is a 72 y.o. female.  HPI  Patient is a 72yo female with a history of hypertension, hyperlipidemia, type 2 diabetes who presents to the emergency department for concern that her blood pressure is elevated.  Patient reports that she intermittently has dizziness upon waking for several months now.  States that this morning she sat up and similarly felt as if the room was spinning.  Reports that the dizziness lasted about 1hour which is longer than usual. No focal numbness, weakness, difficulty speaking, visual disturbance at the time. Drank some water and was not improved, so she decided to come to the ED. States that the dizziness has resolved "but I know that my blood pressure is up." Denies headache, chest pain, sob, abdominal pain, nausea/vomiting, urinary frequency, lightheadedness or syncope.  She is able to ambulate independently.  She has not taken her blood pressure today.  Past Medical History:  Diagnosis Date  . Anemia    -NOS- Positive stool guaiac cards, refuses colonoscopy  . Exposure to second hand smoke   . Hypercalcemia    NI SPEP, UPEP, PTH  . Hyperlipidemia   . Hypertension   . Hypokalemia    2nd diuretics  . Proteinuria   . Shoulder pain    Work Related    Patient Active Problem List   Diagnosis Date Noted  . Dizziness 02/23/2018  . Atrial arrhythmia 07/28/2017  . Sinus pause 05/06/2016  . Stage 2 chronic kidney disease due to type 2 diabetes mellitus (HCC) 12/02/2013  . Health maintenance examination 04/05/2013  . Diabetes mellitus without complication (HCC) 02/28/2010  . Hyperlipidemia 11/06/2006  . Hypercalcemia 11/06/2006  . Essential hypertension 11/06/2006    History reviewed. No pertinent surgical history.   OB  History   None      Home Medications    Prior to Admission medications   Medication Sig Start Date End Date Taking? Authorizing Provider  aspirin 81 MG EC tablet Take 1 tablet (81 mg total) by mouth daily. 10/30/11   Sunday Spillers, MD  EQ LORATADINE 10 MG tablet TAKE ONE TABLET BY MOUTH ONCE DAILY 07/14/16   Burns, Tinnie Gens, MD  metFORMIN (GLUCOPHAGE) 500 MG tablet Take 2 tablets in the morning and 1 tablet in the evening. 02/23/18   Lanelle Bal, MD  olmesartan (BENICAR) 40 MG tablet Take 1 tablet (40 mg total) by mouth daily. 02/23/18 02/23/19  Lanelle Bal, MD  pravastatin (PRAVACHOL) 40 MG tablet Take 1 tablet (40 mg total) by mouth daily. 07/28/17   Lanelle Bal, MD  triamterene-hydrochlorothiazide (MAXZIDE) 75-50 MG tablet Take 0.5 tablets by mouth daily. 02/23/18   Lanelle Bal, MD    Family History Family History  Problem Relation Age of Onset  . Cancer Mother   . Vision loss Father     Social History Social History   Tobacco Use  . Smoking status: Never Smoker  . Smokeless tobacco: Never Used  . Tobacco comment: Husband smoked around for years  Substance Use Topics  . Alcohol use: No    Alcohol/week: 0.0 oz  . Drug use: No     Allergies   Lisinopril   Review of Systems Review of Systems  Constitutional: Negative for chills, diaphoresis and fever.  Eyes: Negative  for visual disturbance.  Respiratory: Negative for shortness of breath.   Cardiovascular: Negative for chest pain.  Gastrointestinal: Negative for abdominal pain, nausea and vomiting.  Genitourinary: Negative for difficulty urinating, dysuria and frequency.  Musculoskeletal: Negative for gait problem and neck pain.  Skin: Negative for rash.  Neurological: Positive for dizziness (earlier today, resolved). Negative for syncope, speech difficulty, weakness, numbness and headaches.  Psychiatric/Behavioral: Negative for agitation.     Physical Exam Updated Vital Signs BP  134/83   Pulse 71   Temp 97.7 F (36.5 C) (Oral)   Resp 17   Ht 5\' 3"  (1.6 m)   Wt 85.7 kg (189 lb)   LMP 01/14/1995   SpO2 98%   BMI 33.48 kg/m   Physical Exam  Constitutional: She is oriented to person, place, and time. She appears well-developed and well-nourished. No distress.  Nontoxic-appearing, no acute distress.  HENT:  Head: Normocephalic and atraumatic.  Mouth/Throat: Oropharynx is clear and moist. No oropharyngeal exudate.  Eyes: Pupils are equal, round, and reactive to light. Conjunctivae and EOM are normal. Right eye exhibits no discharge. Left eye exhibits no discharge.  Neck: Normal range of motion. Neck supple. No JVD present. No tracheal deviation present.  Cardiovascular: Intact distal pulses.  No murmur heard. Normal rate, irregular rhythm.  Pulmonary/Chest: Effort normal and breath sounds normal. No stridor. No respiratory distress. She has no wheezes.  Abdominal: Soft. There is no tenderness.  Musculoskeletal: Normal range of motion.  Neurological: She is alert and oriented to person, place, and time. Coordination normal.  Mental Status:  Alert, oriented, thought content appropriate, able to give a coherent history. Speech fluent without evidence of aphasia. Able to follow 2 step commands without difficulty.  Cranial Nerves:  II:  Peripheral visual fields grossly normal, pupils equal, round, reactive to light III,IV, VI: ptosis not present, extra-ocular motions intact bilaterally  V,VII: smile symmetric, facial light touch sensation equal VIII: hearing grossly normal to voice  X: uvula elevates symmetrically  XI: bilateral shoulder shrug symmetric and strong XII: midline tongue extension without fassiculations Motor:  Normal tone. 5/5 in upper and lower extremities bilaterally including strong and equal grip strength and dorsiflexion/plantar flexion Sensory: Pinprick and light touch normal in all extremities.  Cerebellar: normal finger-to-nose with  bilateral upper extremities Gait: normal gait and balance  Skin: Skin is warm and dry. She is not diaphoretic.  Psychiatric: She has a normal mood and affect. Her behavior is normal.  Nursing note and vitals reviewed.    ED Treatments / Results  Labs (all labs ordered are listed, but only abnormal results are displayed) Labs Reviewed  CBG MONITORING, ED - Abnormal; Notable for the following components:      Result Value   Glucose-Capillary 108 (*)    All other components within normal limits    EKG None  Radiology No results found.  Procedures Procedures (including critical care time)  Medications Ordered in ED Medications - No data to display   Initial Impression / Assessment and Plan / ED Course  I have reviewed the triage vital signs and the nursing notes.  Pertinent labs & imaging results that were available during my care of the patient were reviewed by me and considered in my medical decision making (see chart for details).     Patient's symptoms consistent with peripheral vertigo. Her symptoms happen every morning. No focal numbness, weakness, dysarthria or visual changes to suggest TIA.  On exam no neurological deficits, no concern for acute CVA.  On arrival her blood pressure was 156/79.  She denies dizziness in the ED and is able to ambulate without difficulty. Plan to discharge home with meclizine and have counseled her to follow-up with her PCP for recheck. Have counseled her to take her prescribed blood pressure medication when she gets home.  No chest pain, shortness of breath, vision change or headache to suggest hypertensive emergency. CBG 108. Do not feel that further lab work is necessary at this time. This was a shared visit with Dr. Rennis ChrisJacobowitz who also saw the patient and agrees with plan and discharge home.    Final Clinical Impressions(s) / ED Diagnoses   Final diagnoses:  Dizziness    ED Discharge Orders        Ordered    meclizine (ANTIVERT)  12.5 MG tablet  2 times daily     04/24/18 1130       Lawrence MarseillesShrosbree, Emily J, PA-C 04/24/18 1621    Doug SouJacubowitz, Sam, MD 04/24/18 [phone]

## 2018-04-24 NOTE — ED Triage Notes (Signed)
Pt. Stated, My BP was high this morning and wanted to have it checked.

## 2018-04-24 NOTE — ED Notes (Signed)
Pt stable, ambulatory, and verbalizes understanding of d/c instructions.  

## 2018-04-24 NOTE — Discharge Instructions (Addendum)
Follow-up with your regular doctor as scheduled next month.  You can take meclizine twice a day as needed for room spinning.  Take your prescribed blood pressure medicine when you get back home.  Return to the emergency department if you have any new or concerning symptoms like dizziness with numbness or weakness, dizziness with trouble speaking.

## 2018-05-25 ENCOUNTER — Encounter (INDEPENDENT_AMBULATORY_CARE_PROVIDER_SITE_OTHER): Payer: Self-pay

## 2018-05-25 ENCOUNTER — Encounter: Payer: Self-pay | Admitting: Internal Medicine

## 2018-05-25 ENCOUNTER — Ambulatory Visit (INDEPENDENT_AMBULATORY_CARE_PROVIDER_SITE_OTHER): Payer: Medicare Other | Admitting: Internal Medicine

## 2018-05-25 VITALS — BP 140/67 | HR 87 | Temp 98.4°F

## 2018-05-25 DIAGNOSIS — I10 Essential (primary) hypertension: Secondary | ICD-10-CM

## 2018-05-25 DIAGNOSIS — E119 Type 2 diabetes mellitus without complications: Secondary | ICD-10-CM | POA: Diagnosis not present

## 2018-05-25 DIAGNOSIS — Z79899 Other long term (current) drug therapy: Secondary | ICD-10-CM | POA: Diagnosis not present

## 2018-05-25 DIAGNOSIS — E785 Hyperlipidemia, unspecified: Secondary | ICD-10-CM | POA: Diagnosis not present

## 2018-05-25 DIAGNOSIS — E7800 Pure hypercholesterolemia, unspecified: Secondary | ICD-10-CM

## 2018-05-25 DIAGNOSIS — Z0000 Encounter for general adult medical examination without abnormal findings: Secondary | ICD-10-CM

## 2018-05-25 DIAGNOSIS — Z7984 Long term (current) use of oral hypoglycemic drugs: Secondary | ICD-10-CM | POA: Diagnosis not present

## 2018-05-25 DIAGNOSIS — R42 Dizziness and giddiness: Secondary | ICD-10-CM

## 2018-05-25 DIAGNOSIS — Z1211 Encounter for screening for malignant neoplasm of colon: Secondary | ICD-10-CM

## 2018-05-25 DIAGNOSIS — I878 Other specified disorders of veins: Secondary | ICD-10-CM

## 2018-05-25 LAB — POCT GLYCOSYLATED HEMOGLOBIN (HGB A1C): Hemoglobin A1C: 7 % — AB (ref 4.0–5.6)

## 2018-05-25 LAB — GLUCOSE, CAPILLARY: GLUCOSE-CAPILLARY: 112 mg/dL — AB (ref 70–99)

## 2018-05-25 MED ORDER — LOSARTAN POTASSIUM 100 MG PO TABS: 100.0000 mg | ORAL_TABLET | Freq: Every day | ORAL | 1 refills | Status: DC

## 2018-05-25 NOTE — Progress Notes (Signed)
   CC: Routine visit for HTN and DMII  HPI:[name] is a 72 y.o. female who presents today for routine evaluation of her HTN, DMII and dizziness.  Dizziness: [facility] for recurrence of her dizziness.  This occurred on April 24, 2018.  The dizziness has resolved since that time and is reportedly improved with the cessation of salt intake. As she does not have a history or current signs and symptoms of congestive heart failure either right or left I am not sure what to make this report with regard to the sodium intake increasing the dizziness.  She endorses asensation of the room is spinning or feeling lying down.  There is no associated fatigue, dyspnea, orthostatic hypotension-like symptoms, headache, visual changes, chest pain, peripheral edema, palpitations, loss of consciousness, or other concerning symptoms.  I will review her chart extensively and continue to monitor this symptom recurs.  HTN: BP 140/67 today. No acute issues. The patient chose to continue her Losartan and has not been taking the olmesartan as we discussed previously. I will cancel the olmesartan.  Continue losartan 100mg  daily  Hyperlipidemia: Elevated-continue Pravastatin 40mg  daily  DMII: Prior hemoglobin A1c was 7.1, it is now 7.0 At this visit. Diabetic foot exam-completed in 02/2018 Refilled metformin last visit No acute changes indicated continue to monitor  Health Maintenance: Colon cancer screening ordered FIT. PNA vac offered Prevnar vaccine continue given signs on account of this and all other vaccines at this time.   Past Medical History:  Diagnosis Date  . Anemia    -NOS- Positive stool guaiac cards, refuses colonoscopy  . Exposure to second hand smoke   . Hypercalcemia    NI SPEP, UPEP, PTH  . Hyperlipidemia   . Hypertension   . Hypokalemia    2nd diuretics  . Proteinuria   . Shoulder pain    Work Related   Review of Systems:  ROS  negative except as per HPI.  Physical Exam:  Vitals:   05/25/18 1317  BP: 140/67  Pulse: 87  Temp: 98.4 F (36.9 C)  TempSrc: Oral  SpO2: 98%   Physical Exam  Constitutional: She is oriented to person, place, and time. She appears well-nourished. No distress.  HENT:  Head: Normocephalic and atraumatic.  Eyes: Pupils are equal, round, and reactive to light. Conjunctivae and EOM are normal.  Neck: Neck supple. JVD (2cm JVD bilaterally) present.  Cardiovascular: Normal rate and regular rhythm.  No murmur heard. Pulmonary/Chest: Effort normal and breath sounds normal. No respiratory distress.  Abdominal: Soft. Bowel sounds are normal. She exhibits no distension. There is no tenderness.  Musculoskeletal: She exhibits no edema or tenderness.  Neurological: She is alert and oriented to person, place, and time.  Skin: Skin is warm. Capillary refill takes less than 2 seconds. She is not diaphoretic.  Psychiatric: She has a normal mood and affect.   Assessment & Plan:   See Encounters Tab for problem based charting.  Patient discussed with Dr. Cyndie ChimeGranfortuna

## 2018-05-25 NOTE — Patient Instructions (Addendum)
FOLLOW-UP INSTRUCTIONS When: 3 months For: Routine visit What to bring: All of your medications  Thank you for your visit the most common internal medicine clinic.  I refilled the losartan and would like you to continue taking this.  If you have any olmesartan please do not take this.  Continue home colon cancer screening which you will mail as directed.  If you have any questions or concerns please feel free to contact us.

## 2018-05-25 NOTE — Assessment & Plan Note (Signed)
  Hyperlipidemia: Elevated-continue Pravastatin 40mg  daily

## 2018-05-25 NOTE — Assessment & Plan Note (Signed)
DMII: Prior hemoglobin A1c was 7.1, it is now 7.0 At this visit. Diabetic foot exam-completed in 02/2018 Refilled metformin last visit No acute changes indicated continue to monitor

## 2018-05-25 NOTE — Assessment & Plan Note (Signed)
  Health Maintenance: Colon cancer screening ordered FIT. PNA vac offered Prevnar vaccine continue given signs on account of this and all other vaccines at this time.

## 2018-05-25 NOTE — Progress Notes (Signed)
Medicine attending: Medical history, presenting problems, physical findings, and medications, reviewed with resident physician Dr Lawrence Harbrecht on the day of the patient visit and I concur with his evaluation and management plan. 

## 2018-05-25 NOTE — Assessment & Plan Note (Signed)
HTN: BP 140/67 today. No acute issues. The patient chose to continue her Losartan and has not been taking the olmesartan as we discussed previously. I will cancel the olmesartan.  Continue losartan 100mg  daily

## 2018-05-25 NOTE — Assessment & Plan Note (Signed)
Health Maintenance: Colon cancer screening ordered FIT.

## 2018-05-25 NOTE — Assessment & Plan Note (Signed)
Dizziness: Patient presented to [facility] emergency department for recurrence of her dizziness.  This occurred on April 24, 2018.  The dizziness has resolved since that time and is reportedly improved with the cessation of salt intake. As she does not have a history or current signs and symptoms of congestive heart failure either right or left I am not sure what to make this report with regard to the sodium intake increasing the dizziness.  She endorses asensation of the room is spinning or feeling lying down.  There is no associated fatigue, dyspnea, orthostatic hypotension-like symptoms, headache, visual changes, chest pain, peripheral edema, palpitations, loss of consciousness, or other concerning symptoms.  I will review her chart extensively and continue to monitor this symptom recurs.

## 2018-06-15 ENCOUNTER — Encounter: Payer: Self-pay | Admitting: *Deleted

## 2018-06-15 NOTE — Progress Notes (Signed)
06-15-18 We received [name]'s IFOBT kit by mail today. Unable to read collection date written on the specimen. I called Sarah Branch to verify date.  She stated the specimen was collected on 05-28-18. I explained to Sarah Branch that we could not analyze the sample because it was too old.  ( Policy states must be analyzed within 15 days of collection). I told her we would give her a new kit at her next [facility] visit.  Patient expressed understanding.  Tracey Fulcher, PBT 06-15-18 16:09     

## 2018-07-28 ENCOUNTER — Other Ambulatory Visit: Payer: Self-pay | Admitting: Internal Medicine

## 2018-07-28 DIAGNOSIS — E7800 Pure hypercholesterolemia, unspecified: Secondary | ICD-10-CM

## 2018-07-28 NOTE — Telephone Encounter (Signed)
Pt is calling checking on medicine  °

## 2018-08-24 ENCOUNTER — Other Ambulatory Visit: Payer: Self-pay

## 2018-08-24 ENCOUNTER — Ambulatory Visit (INDEPENDENT_AMBULATORY_CARE_PROVIDER_SITE_OTHER): Payer: Medicare Other | Admitting: Internal Medicine

## 2018-08-24 VITALS — BP 144/72 | HR 103 | Temp 98.4°F | Wt 188.4 lb

## 2018-08-24 DIAGNOSIS — Z1211 Encounter for screening for malignant neoplasm of colon: Secondary | ICD-10-CM

## 2018-08-24 DIAGNOSIS — E119 Type 2 diabetes mellitus without complications: Secondary | ICD-10-CM

## 2018-08-24 DIAGNOSIS — Z23 Encounter for immunization: Secondary | ICD-10-CM

## 2018-08-24 DIAGNOSIS — I10 Essential (primary) hypertension: Secondary | ICD-10-CM

## 2018-08-24 NOTE — Patient Instructions (Signed)
FOLLOW-UP INSTRUCTIONS When: 3-4 months For: Routine visit with me, Dr. Crista Elliot What to bring: All of your medications  I have stopped your losartan at your request. If your symptoms resolve in three months we will start and alternative medication. If they do not, we will look for an alternative cause.   As always if your symptoms worsen, fail to improve, or your develop other concerning symptoms, please notify our office or visit the local ER if we are unavailable. Symptoms including persistent dizziness, headache, visual changes, should not be ignored and should encourage you to call our clinic visit the ED if the symptoms are severe.  Thank you for your visit to the [facility] today.

## 2018-08-24 NOTE — Progress Notes (Signed)
   CC: Routine for HTN and diabetes  HPI:Sarah Branch is a 72 y.o. female who presents for evaluation of HTN and diabetes. Please see individual problem based A/P for details.  HTN: One episode of orthostatic hypotension like symptoms were the patient stated she was sitting, stood rapidly and upon standing became dizzy or more specifically, lightheaded. That particular set of symptoms resolved with sitting and did not again occur since that time. She continue to experience initially seemingly random episodes of dizziness while lying flat but only in associated with taking her Losartan. She attested to cessation of the Losartan since that event other than taking it again the morning of our visit today. She believes that the Losartan is causing the dizziness. She currently denies headache, nausea, vomiting, chest pain, palpitations or visual changes.   Continuing to consider other etiologies for her symptoms including BPPV, possible new onset arrhythmia vs orthostatic hypotension. However, as her symptoms are random, intermittent and currently of uncertain etiology we will continue to monitor. Currently there is no clear indication of an emergent scenario mandating intracranial imaging.  Plan: We have agreed to hold her Losartan for three months. If her symptoms resolve we will initiate alternative therapy. If they do not, we will restart therapy or consider and alternative. She would like to refrain from starting an alternative today.  Diabetes:  No need to repeat A1c today. Continue Metformin. Continue to dietary restrictions at home. Denied concerns. A1c in 3 months at her next Routine visit.  Colon cancer screening Ordred FIT testing  Need for influenza vaccination: Patient decided to opt out of the vaccination today.   Past Medical History:  Diagnosis Date  . Anemia    -NOS- Positive stool guaiac cards, refuses colonoscopy  . Exposure to second hand smoke   . Hypercalcemia    NI  SPEP, UPEP, PTH  . Hyperlipidemia   . Hypertension   . Hypokalemia    2nd diuretics  . Proteinuria   . Shoulder pain    Work Related   Review of Systems:  ROS negative except as per HPI.  Physical Exam: Vitals:   08/24/18 1309  BP: (!) 144/72  Pulse: (!) 103  Temp: 98.4 F (36.9 C)  TempSrc: Oral  SpO2: 100%  Weight: 188 lb 6.4 oz (85.5 kg)   General: A/O x 4, in no acute distress, afebrile, nondiaphoretic  HEENT: PERRL, EOM Cardio: RRR, no mrg's, no JVD noted on exam Pulmonary: CTA bilaterally, no wheezing auscultated MSK: Bilateral lower extremities nontender with mild pitting edema  Assessment & Plan:   See Encounters Tab for problem based charting.  Patient discussed with Dr. Josem Kaufmann

## 2018-08-25 ENCOUNTER — Encounter: Payer: Self-pay | Admitting: Internal Medicine

## 2018-08-25 DIAGNOSIS — Z23 Encounter for immunization: Secondary | ICD-10-CM | POA: Insufficient documentation

## 2018-08-25 NOTE — Assessment & Plan Note (Signed)
Need for influenza vaccination: Patient decided to opt out of the vaccination today.

## 2018-08-25 NOTE — Assessment & Plan Note (Signed)
HTN: One episode of orthostatic hypotension like symptoms were the patient stated she was sitting, stood rapidly and upon standing became dizzy or more specifically, lightheaded. That particular set of symptoms resolved with sitting and did not again occur since that time. She continue to experience initially seemingly random episodes of dizziness while lying flat but only in associated with taking her Losartan. She attested to cessation of the Losartan since that event other than taking it again the morning of our visit today. She believes that the Losartan is causing the dizziness. She currently denies headache, nausea, vomiting, chest pain, palpitations or visual changes.   Continuing to consider other etiologies for her symptoms including BPPV, possible new onset arrhythmia vs orthostatic hypotension. However, as her symptoms are random, intermittent and currently of uncertain etiology we will continue to monitor. Currently there is no clear indication of an emergent scenario mandating intracranial imaging.  Plan: We have agreed to hold her Losartan for three months. If her symptoms resolve we will initiate alternative therapy. If they do not, we will restart therapy or consider and alternative. She would like to refrain from starting an alternative today.

## 2018-08-25 NOTE — Assessment & Plan Note (Signed)
  Diabetes:  No need to repeat A1c today. Continue Metformin. Continue to dietary restrictions at home. Denied concerns. A1c in 3 months at her next Routine visit.

## 2018-08-25 NOTE — Assessment & Plan Note (Signed)
Colon cancer screening Ordred FIT testing

## 2018-08-26 NOTE — Progress Notes (Signed)
Case discussed with Dr. Harbrecht at the time of the visit.  We reviewed the resident's history and exam and pertinent patient test results.  I agree with the assessment, diagnosis and plan of care documented in the resident's note. 

## 2018-10-19 DIAGNOSIS — Z961 Presence of intraocular lens: Secondary | ICD-10-CM | POA: Diagnosis not present

## 2018-10-19 DIAGNOSIS — H26492 Other secondary cataract, left eye: Secondary | ICD-10-CM | POA: Diagnosis not present

## 2018-10-19 DIAGNOSIS — E119 Type 2 diabetes mellitus without complications: Secondary | ICD-10-CM | POA: Diagnosis not present

## 2018-10-19 LAB — HM DIABETES EYE EXAM

## 2018-11-03 ENCOUNTER — Encounter: Payer: Self-pay | Admitting: *Deleted

## 2018-11-28 ENCOUNTER — Encounter (INDEPENDENT_AMBULATORY_CARE_PROVIDER_SITE_OTHER): Payer: Self-pay

## 2018-11-28 ENCOUNTER — Ambulatory Visit (INDEPENDENT_AMBULATORY_CARE_PROVIDER_SITE_OTHER): Payer: Medicare Other | Admitting: Internal Medicine

## 2018-11-28 ENCOUNTER — Encounter: Payer: Self-pay | Admitting: Internal Medicine

## 2018-11-28 ENCOUNTER — Other Ambulatory Visit: Payer: Self-pay

## 2018-11-28 VITALS — BP 145/76 | HR 94 | Temp 98.1°F | Wt 187.9 lb

## 2018-11-28 DIAGNOSIS — I10 Essential (primary) hypertension: Secondary | ICD-10-CM

## 2018-11-28 DIAGNOSIS — E1122 Type 2 diabetes mellitus with diabetic chronic kidney disease: Secondary | ICD-10-CM

## 2018-11-28 DIAGNOSIS — E119 Type 2 diabetes mellitus without complications: Secondary | ICD-10-CM

## 2018-11-28 DIAGNOSIS — N182 Chronic kidney disease, stage 2 (mild): Secondary | ICD-10-CM | POA: Diagnosis not present

## 2018-11-28 DIAGNOSIS — Z7722 Contact with and (suspected) exposure to environmental tobacco smoke (acute) (chronic): Secondary | ICD-10-CM

## 2018-11-28 DIAGNOSIS — Z79899 Other long term (current) drug therapy: Secondary | ICD-10-CM

## 2018-11-28 DIAGNOSIS — I129 Hypertensive chronic kidney disease with stage 1 through stage 4 chronic kidney disease, or unspecified chronic kidney disease: Secondary | ICD-10-CM

## 2018-11-28 DIAGNOSIS — Z7984 Long term (current) use of oral hypoglycemic drugs: Secondary | ICD-10-CM

## 2018-11-28 LAB — GLUCOSE, CAPILLARY: Glucose-Capillary: 138 mg/dL — ABNORMAL HIGH (ref 70–99)

## 2018-11-28 LAB — POCT GLYCOSYLATED HEMOGLOBIN (HGB A1C): Hemoglobin A1C: 7 % — AB (ref 4.0–5.6)

## 2018-11-28 MED ORDER — TRIAMTERENE-HCTZ 75-50 MG PO TABS: 0.5000 | ORAL_TABLET | Freq: Every day | ORAL | 3 refills | Status: DC

## 2018-11-28 NOTE — Patient Instructions (Signed)
FOLLOW-UP INSTRUCTIONS When: 6 months  For: Routine visit What to bring: All of your medications   I have not made any changes to your medications today.   Today we discussed your high blood pressure and need for increasing control of this. Please continue to walk daily, take your blood pressure medication, eat a diet with less than 2 grams of sodium and avoid salty foods.   With regard to your diabetes, I recommend continuing the Metformin and avoiding sweet drinks and foods.   I will notify you of the results of any labs from today's evaluation when available to me.   Thank you for your visit to the [facility] today. If you have any questions or concerns please call us at [phone].

## 2018-11-28 NOTE — Progress Notes (Signed)
   CC: Routine visit for HTN, DMII and CKD  HPI:Sarah Branch is a 73 y.o. female who presents for evaluation of Routine visit for HTN, DMII and CKD. Please see individual problem based A/P for details.  PHQ-9: Based on the patients    Office Visit from 11/28/2018 in [facility]  PHQ-9 Total Score  3     score we have decided to monitor due to low risk features.  Past Medical History:  Diagnosis Date  . Anemia    -NOS- Positive stool guaiac cards, refuses colonoscopy  . Dizziness 02/23/2018  . Exposure to second hand smoke   . Hypercalcemia    NI SPEP, UPEP, PTH  . Hyperlipidemia   . Hypertension   . Hypokalemia    2nd diuretics  . Proteinuria   . Shoulder pain    Work Related   Review of Systems:  ROS negative except as per HPI.  Physical Exam: Vitals:   11/28/18 1310 11/28/18 1323  BP: (!) 165/81 (!) 145/76  Pulse: 97 94  Temp: 98.1 F (36.7 C)   TempSrc: Oral   SpO2: 98%   Weight: 187 lb 14.4 oz (85.2 kg)    General: A/O x4, in no acute distress, afebrile, nondiaphoretic Cardio: RRR, no mrg's,  Pulmonary: CTA bilaterally  MSK: BLE nontender, nonedematous  Assessment & Plan:   See Encounters Tab for problem based charting.  Patient discussed with Dr. Sandre Kitty

## 2018-11-28 NOTE — Assessment & Plan Note (Addendum)
HTN: Repeat BP today, demonstrated improvement. She denied symptoms. I discussed at length the importance of good BP control and the need for an ACEi or ARB. She prefers not to make any medication changes today.  Plan:  I discussed starting an alternative ACEi or ARB today but given her dizziness associated with the Losartan, and the cough with Lisinopril, she is resistant to any medication in either class class.  BMP ordered

## 2018-11-29 ENCOUNTER — Encounter: Payer: Self-pay | Admitting: Internal Medicine

## 2018-11-29 LAB — BMP8+ANION GAP
Anion Gap: 20 mmol/L — ABNORMAL HIGH (ref 10.0–18.0)
BUN / CREAT RATIO: 14 (ref 12–28)
BUN: 15 mg/dL (ref 8–27)
CO2: 23 mmol/L (ref 20–29)
Calcium: 10.7 mg/dL — ABNORMAL HIGH (ref 8.7–10.3)
Chloride: 92 mmol/L — ABNORMAL LOW (ref 96–106)
Creatinine, Ser: 1.04 mg/dL — ABNORMAL HIGH (ref 0.57–1.00)
GFR calc Af Amer: 62 mL/min/{1.73_m2} (ref >59)
GFR calc non Af Amer: 53 mL/min/{1.73_m2} — ABNORMAL LOW (ref >59)
GLUCOSE: 130 mg/dL — AB (ref 65–99)
Potassium: 3.6 mmol/L (ref 3.5–5.2)
SODIUM: 135 mmol/L (ref 134–144)

## 2018-11-29 NOTE — Progress Notes (Signed)
Internal Medicine Clinic Attending  Case discussed with Dr. Crista Elliot at the time of the visit.  We reviewed the resident's history and exam and pertinent patient test results.  I agree with the assessment, diagnosis, and plan of care documented in the resident's note.  Given her CKD, she is a reasonable candidate for an SGLT2 inhibitor to try to prevent progression of her CKD, and it should help her BP as well. She is reluctant to start a new medication at this time, but consider in the future.  Jessy Oto, M.D., Ph.D.

## 2018-11-29 NOTE — Assessment & Plan Note (Signed)
DMII: Patient denied symptoms of hypoglycemia. She denied increased thirst or urination. She stated that she continues to watch what she eats and tries to avoid sweet food, white bread and sweetened beverages.   Plan: Continue diet, exercise and Metformin.  Ordered A1c which returned at 7.0%, as this is stable, we will continue current therapy

## 2018-11-29 NOTE — Assessment & Plan Note (Signed)
CKDII: GFR remaining stable with Scr of 0.99 and GFR 58. Will monitor annually given her HTN and DMII. Will obtain BMP today.   Plan: Slightly increased calcium noted, will need to obtain an ionized calcium and albumin at her next visit. BMP ordered and slightly elevated from prior at 1.04 from 0.99 but insignificantly so, GFR stable. Will repeat in 6 months.

## 2018-11-30 ENCOUNTER — Encounter: Payer: Medicare Other | Admitting: Internal Medicine

## 2018-12-08 ENCOUNTER — Telehealth: Payer: Self-pay | Admitting: Internal Medicine

## 2019-04-02 ENCOUNTER — Encounter: Payer: Self-pay | Admitting: *Deleted

## 2019-04-21 ENCOUNTER — Telehealth: Payer: Self-pay | Admitting: Internal Medicine

## 2019-04-21 ENCOUNTER — Other Ambulatory Visit: Payer: Self-pay | Admitting: Internal Medicine

## 2019-04-21 DIAGNOSIS — E119 Type 2 diabetes mellitus without complications: Secondary | ICD-10-CM

## 2019-04-21 NOTE — Telephone Encounter (Signed)
This request has been sent to PCP via [employer]. Awaiting reply. Kinnie Feil, RN, BSN

## 2019-04-21 NOTE — Telephone Encounter (Signed)
Needs refill on metFORMIN (GLUCOPHAGE) 500 MG tablet [address] [place], [place] - [address] ;pt contact [phone]

## 2019-05-31 ENCOUNTER — Encounter: Payer: Medicare Other | Admitting: Internal Medicine

## 2019-06-07 ENCOUNTER — Other Ambulatory Visit: Payer: Self-pay

## 2019-06-07 ENCOUNTER — Ambulatory Visit (HOSPITAL_COMMUNITY)
Admission: RE | Admit: 2019-06-07 | Discharge: 2019-06-07 | Disposition: A | Discharge status: Home / Self Care | Payer: Medicare Other | Source: Ambulatory Visit | Attending: Internal Medicine | Admitting: Internal Medicine

## 2019-06-07 ENCOUNTER — Encounter: Payer: Self-pay | Admitting: Internal Medicine

## 2019-06-07 ENCOUNTER — Ambulatory Visit (INDEPENDENT_AMBULATORY_CARE_PROVIDER_SITE_OTHER): Payer: Medicare Other | Admitting: Internal Medicine

## 2019-06-07 VITALS — BP 141/83 | HR 99 | Temp 98.6°F | Wt 186.4 lb

## 2019-06-07 DIAGNOSIS — E119 Type 2 diabetes mellitus without complications: Secondary | ICD-10-CM

## 2019-06-07 DIAGNOSIS — Z7984 Long term (current) use of oral hypoglycemic drugs: Secondary | ICD-10-CM

## 2019-06-07 DIAGNOSIS — Z1159 Encounter for screening for other viral diseases: Secondary | ICD-10-CM

## 2019-06-07 DIAGNOSIS — Z79899 Other long term (current) drug therapy: Secondary | ICD-10-CM

## 2019-06-07 DIAGNOSIS — I499 Cardiac arrhythmia, unspecified: Secondary | ICD-10-CM | POA: Diagnosis not present

## 2019-06-07 DIAGNOSIS — Z7722 Contact with and (suspected) exposure to environmental tobacco smoke (acute) (chronic): Secondary | ICD-10-CM

## 2019-06-07 DIAGNOSIS — I10 Essential (primary) hypertension: Secondary | ICD-10-CM

## 2019-06-07 DIAGNOSIS — E785 Hyperlipidemia, unspecified: Secondary | ICD-10-CM | POA: Diagnosis not present

## 2019-06-07 DIAGNOSIS — E7800 Pure hypercholesterolemia, unspecified: Secondary | ICD-10-CM

## 2019-06-07 LAB — POCT GLYCOSYLATED HEMOGLOBIN (HGB A1C): Hemoglobin A1C: 7 % — AB (ref 4.0–5.6)

## 2019-06-07 LAB — GLUCOSE, CAPILLARY: Glucose-Capillary: 123 mg/dL — ABNORMAL HIGH (ref 70–99)

## 2019-06-07 MED ORDER — OLMESARTAN MEDOXOMIL 20 MG PO TABS: 20.0000 mg | ORAL_TABLET | Freq: Every day | ORAL | 2 refills | Status: DC

## 2019-06-07 MED ORDER — PRAVASTATIN SODIUM 40 MG PO TABS: 40.0000 mg | ORAL_TABLET | Freq: Every day | ORAL | 3 refills | Status: DC

## 2019-06-07 NOTE — Progress Notes (Addendum)
   CC: DMII, HTN, and hypercalcemia  HPI:[name] is a 73 y.o. female who presents for evaluation of DMII, HTN and Hypercalcemia. Please see individual problem based A/P for details.   Past Medical History:  Diagnosis Date  . Anemia    -NOS- Positive stool guaiac cards, refuses colonoscopy  . Dizziness 02/23/2018  . Exposure to second hand smoke   . Hypercalcemia    NI SPEP, UPEP, PTH  . Hyperlipidemia   . Hypertension   . Hypokalemia    2nd diuretics  . Proteinuria   . Shoulder pain    Work Related   Review of Systems:  ROS negative except as per HPI.  Physical Exam: Vitals:   06/07/19 1313 06/07/19 1339  BP: (!) 178/101 (!) 141/83  Pulse: (!) 107 99  Temp: 98.6 F (37 C)   TempSrc: Oral   SpO2: 100%   Weight: 186 lb 6.4 oz (84.6 kg)    General: In no acute distress, afebrile, nondiaphoretic HEENT: PEERL, EMO intact Cardio: Irregular rhythm, no mrg's  Pulmonary: CTA bilaterally, no wheezing or crackles  Abdomen: Bowel sounds normal, soft, nontender  MSK: BLE nontender, nonedematous Neuro: Alert, normal gait Psych: Appropriate affect, not depressed in appearance, engages well  Assessment & Plan:   See Encounters Tab for problem based charting.  Patient discussed with Sarah Branch

## 2019-06-07 NOTE — Patient Instructions (Signed)
FOLLOW-UP INSTRUCTIONS When: 4-5 months, some time in mid December For: Routine visit What to bring: All of your medications  I have started Olmesartan. Please take this for a week and if you notice any side effects, which I do not expect, please let us know.   Today we discussed your high blood pressure, irregular heart rhythm diabetes and need to follow-up on the high blood calcium levels. I will notify you of the results of any labs from today's evaluation when available to me.   Thank you for your visit to the [facility] today. If you have any questions or concerns please call us at [phone].

## 2019-06-07 NOTE — Assessment & Plan Note (Signed)
Noted irregular rhythm on cardiac auscultation but unable to clearly define. EKG demonstrated Sinus arrhythmia with frequent APC's approximately every 5-8 beats. Patient is asymptomatic.  Patient given recommendation to notify us if she develops additional symptoms.  Monitor for now

## 2019-06-07 NOTE — Assessment & Plan Note (Signed)
  DMII: Hgb A1c 7.0 % at the last. Current A1c 7.0%. The patient denied polyuria, polydipsia, headache, fatigue, confusion, nausea, vomiting or diaphoresis.   Plan: Continue Metformin 500mg  BID Continue dietary discretion  Repeat A1c at next visit Lipid profile ordered to determine response Microalbumin/creatinine urine ratio ordered, if elevated, will need to attempt additional various ARBs

## 2019-06-07 NOTE — Assessment & Plan Note (Signed)
  Need for Hep C Screening: Routine Screening needed  Plan: Ordered Hep C antibody

## 2019-06-07 NOTE — Assessment & Plan Note (Addendum)
Hypercalcemia: Noted on prior three BMPs without significant increase or change since that time. Likely attributable to her diuretic but will determine its relevance to PTH given her need to this antihypertensive.   Plan:  Obtain CMP today and calculate corrected. Ordered PTH to determine if subject to PTH or independent  Addendum: Calcium elevated to 11.3, Albumin 4.8 and PTH intact 20 indicating a PTH independent process. Given her long term use of the thiazide, I recommend that we stop this, consider a low dose lasix vs spironolactone, 20mg  daily and increase the olmesartan to 40mg  at her next visit if she remains hypertensive.

## 2019-06-07 NOTE — Assessment & Plan Note (Addendum)
Hypertension: Patient's BP today is 178/101 initially and 141/83 on reapeat with a goal of <140/80. The patient endorses adherence to her medication regimen. She denied, chest pain, headache, visual changes, lightheadedness, weakness, dizziness on standing, swelling in the feet or ankles. Given her persistent HTN and intolerance to Losartan/lisinopril we will try a different ARB. If she is unable to tolerate and must discontinue the thiazide we may need to consider a CCB.   Plan: Continue combo triamteren/HCTZ 0.5 tablet daily of the 75-50mg  tablet Start Olmesartan 20mg  daily  Addendum: Notfied patient of the change in blood pressure therapy. She was able to teach back that she is stopping the diuretic and starting the Olmesartan.  There is notable proteinuria to 333, I feel that resumption of an ARB is ideal. Will repeat in 6 months.

## 2019-06-07 NOTE — Assessment & Plan Note (Addendum)
Ordered lipid profile.  Addendum: Stable, continue pravastatin 40mg 

## 2019-06-08 LAB — CMP14 + ANION GAP
ALT: 14 IU/L (ref 0–32)
AST: 18 IU/L (ref 0–40)
Albumin/Globulin Ratio: 1.5 (ref 1.2–2.2)
Albumin: 4.8 g/dL — ABNORMAL HIGH (ref 3.7–4.7)
Alkaline Phosphatase: 69 IU/L (ref 39–117)
Anion Gap: 22 mmol/L — ABNORMAL HIGH (ref 10.0–18.0)
BUN/Creatinine Ratio: 14 (ref 12–28)
BUN: 14 mg/dL (ref 8–27)
Bilirubin Total: 0.3 mg/dL (ref 0.0–1.2)
CO2: 24 mmol/L (ref 20–29)
Calcium: 11.3 mg/dL — ABNORMAL HIGH (ref 8.7–10.3)
Chloride: 93 mmol/L — ABNORMAL LOW (ref 96–106)
Creatinine, Ser: 0.99 mg/dL (ref 0.57–1.00)
GFR calc Af Amer: 65 mL/min/{1.73_m2} (ref >59)
GFR calc non Af Amer: 57 mL/min/{1.73_m2} — ABNORMAL LOW (ref >59)
Globulin, Total: 3.2 g/dL (ref 1.5–4.5)
Glucose: 123 mg/dL — ABNORMAL HIGH (ref 65–99)
Potassium: 3.9 mmol/L (ref 3.5–5.2)
Sodium: 139 mmol/L (ref 134–144)
Total Protein: 8 g/dL (ref 6.0–8.5)

## 2019-06-08 LAB — LIPID PANEL
Chol/HDL Ratio: 2.8 ratio (ref 0.0–4.4)
Cholesterol, Total: 189 mg/dL (ref 100–199)
HDL: 68 mg/dL (ref >39)
LDL Calculated: 95 mg/dL (ref 0–99)
Triglycerides: 131 mg/dL (ref 0–149)
VLDL Cholesterol Cal: 26 mg/dL (ref 5–40)

## 2019-06-08 LAB — MICROALBUMIN / CREATININE URINE RATIO
Creatinine, Urine: 42.2 mg/dL
Microalb/Creat Ratio: 333 mg/g creat — ABNORMAL HIGH (ref 0–29)
Microalbumin, Urine: 140.6 ug/mL

## 2019-06-08 LAB — PTH, INTACT AND CALCIUM: PTH: 20 pg/mL (ref 15–65)

## 2019-06-08 LAB — HEPATITIS C ANTIBODY: Hep C Virus Ab: <0.1 s/co ratio (ref 0.0–0.9)

## 2019-06-08 NOTE — Addendum Note (Signed)
Addended by: Nicola Girt on: 06/08/2019 02:37 PM   Modules accepted: Orders

## 2019-06-09 NOTE — Addendum Note (Signed)
Addended by: Aldine Contes on: 06/09/2019 12:25 PM   Modules accepted: Level of Service

## 2019-06-09 NOTE — Progress Notes (Signed)
Internal Medicine Clinic Attending  Case discussed with Dr. Harbrecht at the time of the visit.  We reviewed the resident's history and exam and pertinent patient test results.  I agree with the assessment, diagnosis, and plan of care documented in the resident's note.   

## 2019-06-15 ENCOUNTER — Telehealth: Payer: Self-pay | Admitting: Internal Medicine

## 2019-06-15 NOTE — Telephone Encounter (Signed)
Pt missed call, pls calback [phone]

## 2019-06-15 NOTE — Telephone Encounter (Signed)
Dr Berline Lopes, pt calls and states someone called her today and she didn't understand what they were calling about. Did you have someone call her?

## 2019-06-16 NOTE — Telephone Encounter (Signed)
I called her last week and discussed her medication change, but she was able to correctly teach back what we talked about several times. That was Thursday of last week.   I do not recall if anyone was to speak with her nor if I asked someone to.  Please keep me updated.  Thank you,  Kathi Ludwig

## 2019-07-14 ENCOUNTER — Other Ambulatory Visit: Payer: Self-pay | Admitting: Internal Medicine

## 2019-07-14 DIAGNOSIS — Z1231 Encounter for screening mammogram for malignant neoplasm of breast: Secondary | ICD-10-CM

## 2019-07-22 ENCOUNTER — Inpatient Hospital Stay (HOSPITAL_COMMUNITY)
Admission: EM | Admit: 2019-07-22 | Discharge: 2019-07-24 | DRG: 291 | Disposition: A | Discharge status: Home / Self Care | Payer: Medicare Other | Attending: Student in an Organized Health Care Education/Training Program | Admitting: Student in an Organized Health Care Education/Training Program

## 2019-07-22 ENCOUNTER — Emergency Department (HOSPITAL_COMMUNITY): Payer: Medicare Other

## 2019-07-22 ENCOUNTER — Other Ambulatory Visit: Payer: Self-pay

## 2019-07-22 ENCOUNTER — Encounter (HOSPITAL_COMMUNITY): Payer: Self-pay | Admitting: *Deleted

## 2019-07-22 DIAGNOSIS — R0600 Dyspnea, unspecified: Secondary | ICD-10-CM | POA: Diagnosis not present

## 2019-07-22 DIAGNOSIS — I10 Essential (primary) hypertension: Secondary | ICD-10-CM | POA: Diagnosis not present

## 2019-07-22 DIAGNOSIS — R0689 Other abnormalities of breathing: Secondary | ICD-10-CM | POA: Diagnosis not present

## 2019-07-22 DIAGNOSIS — E119 Type 2 diabetes mellitus without complications: Secondary | ICD-10-CM | POA: Diagnosis present

## 2019-07-22 DIAGNOSIS — Z888 Allergy status to other drugs, medicaments and biological substances status: Secondary | ICD-10-CM

## 2019-07-22 DIAGNOSIS — I5021 Acute systolic (congestive) heart failure: Secondary | ICD-10-CM | POA: Diagnosis not present

## 2019-07-22 DIAGNOSIS — Z20828 Contact with and (suspected) exposure to other viral communicable diseases: Secondary | ICD-10-CM | POA: Diagnosis present

## 2019-07-22 DIAGNOSIS — J810 Acute pulmonary edema: Secondary | ICD-10-CM | POA: Diagnosis not present

## 2019-07-22 DIAGNOSIS — Z7722 Contact with and (suspected) exposure to environmental tobacco smoke (acute) (chronic): Secondary | ICD-10-CM | POA: Diagnosis present

## 2019-07-22 DIAGNOSIS — Z7982 Long term (current) use of aspirin: Secondary | ICD-10-CM

## 2019-07-22 DIAGNOSIS — I248 Other forms of acute ischemic heart disease: Secondary | ICD-10-CM | POA: Diagnosis present

## 2019-07-22 DIAGNOSIS — R0602 Shortness of breath: Secondary | ICD-10-CM | POA: Diagnosis not present

## 2019-07-22 DIAGNOSIS — Z79899 Other long term (current) drug therapy: Secondary | ICD-10-CM

## 2019-07-22 DIAGNOSIS — I160 Hypertensive urgency: Secondary | ICD-10-CM | POA: Diagnosis not present

## 2019-07-22 DIAGNOSIS — I110 Hypertensive heart disease with heart failure: Principal | ICD-10-CM | POA: Diagnosis present

## 2019-07-22 DIAGNOSIS — E785 Hyperlipidemia, unspecified: Secondary | ICD-10-CM | POA: Diagnosis present

## 2019-07-22 DIAGNOSIS — R0902 Hypoxemia: Secondary | ICD-10-CM | POA: Diagnosis not present

## 2019-07-22 DIAGNOSIS — Z7984 Long term (current) use of oral hypoglycemic drugs: Secondary | ICD-10-CM

## 2019-07-22 DIAGNOSIS — E1121 Type 2 diabetes mellitus with diabetic nephropathy: Secondary | ICD-10-CM

## 2019-07-22 DIAGNOSIS — I5020 Unspecified systolic (congestive) heart failure: Secondary | ICD-10-CM | POA: Diagnosis present

## 2019-07-22 DIAGNOSIS — J80 Acute respiratory distress syndrome: Secondary | ICD-10-CM | POA: Diagnosis not present

## 2019-07-22 DIAGNOSIS — I493 Ventricular premature depolarization: Secondary | ICD-10-CM | POA: Diagnosis present

## 2019-07-22 HISTORY — DX: Essential (primary) hypertension: I10

## 2019-07-22 LAB — CBC
HCT: 39 % (ref 36.0–46.0)
Hemoglobin: 12.2 g/dL (ref 12.0–15.0)
MCH: 25.9 pg — ABNORMAL LOW (ref 26.0–34.0)
MCHC: 31.3 g/dL (ref 30.0–36.0)
MCV: 82.8 fL (ref 80.0–100.0)
Platelets: 213 10*3/uL (ref 150–400)
RBC: 4.71 MIL/uL (ref 3.87–5.11)
RDW: 15.5 % (ref 11.5–15.5)
WBC: 7.6 10*3/uL (ref 4.0–10.5)
nRBC: 0 % (ref 0.0–0.2)

## 2019-07-22 LAB — COMPREHENSIVE METABOLIC PANEL
ALT: 24 U/L (ref 0–44)
AST: 26 U/L (ref 15–41)
Albumin: 3.7 g/dL (ref 3.5–5.0)
Alkaline Phosphatase: 53 U/L (ref 38–126)
Anion gap: 13 (ref 5–15)
BUN: 7 mg/dL — ABNORMAL LOW (ref 8–23)
CO2: 25 mmol/L (ref 22–32)
Calcium: 10.1 mg/dL (ref 8.9–10.3)
Chloride: 99 mmol/L (ref 98–111)
Creatinine, Ser: 0.95 mg/dL (ref 0.44–1.00)
GFR calc Af Amer: >60 mL/min (ref >60)
GFR calc non Af Amer: 59 mL/min — ABNORMAL LOW (ref >60)
Glucose, Bld: 186 mg/dL — ABNORMAL HIGH (ref 70–99)
Potassium: 3.2 mmol/L — ABNORMAL LOW (ref 3.5–5.1)
Sodium: 137 mmol/L (ref 135–145)
Total Bilirubin: 1 mg/dL (ref 0.3–1.2)
Total Protein: 7.4 g/dL (ref 6.5–8.1)

## 2019-07-22 LAB — TROPONIN I (HIGH SENSITIVITY)
Troponin I (High Sensitivity): 27 ng/L — ABNORMAL HIGH (ref <18)
Troponin I (High Sensitivity): 97 ng/L — ABNORMAL HIGH (ref <18)

## 2019-07-22 LAB — BRAIN NATRIURETIC PEPTIDE: B Natriuretic Peptide: 111.4 pg/mL — ABNORMAL HIGH (ref 0.0–100.0)

## 2019-07-22 LAB — SARS CORONAVIRUS 2 BY RT PCR (HOSPITAL ORDER, PERFORMED IN ~~LOC~~ HOSPITAL LAB): SARS Coronavirus 2: NEGATIVE

## 2019-07-22 MED ORDER — SENNOSIDES-DOCUSATE SODIUM 8.6-50 MG PO TABS: 1.0000 | ORAL_TABLET | Freq: Every evening | ORAL | Status: DC | PRN

## 2019-07-22 MED ORDER — FUROSEMIDE 10 MG/ML IJ SOLN
60.0000 mg | Freq: Once | INTRAMUSCULAR | Status: AC
  Administered 2019-07-22: 60 mg via INTRAVENOUS
  Filled 2019-07-22: qty 6

## 2019-07-22 MED ORDER — IRBESARTAN 300 MG PO TABS
300.0000 mg | ORAL_TABLET | Freq: Every day | ORAL | Status: DC
  Administered 2019-07-22 – 2019-07-24 (×3): 300 mg via ORAL
  Filled 2019-07-22: qty 1
  Filled 2019-07-22 (×2): qty 2
  Filled 2019-07-22 (×2): qty 1
  Filled 2019-07-22: qty 2

## 2019-07-22 MED ORDER — ASPIRIN EC 81 MG PO TBEC
81.0000 mg | DELAYED_RELEASE_TABLET | Freq: Every day | ORAL | Status: DC
  Administered 2019-07-22 – 2019-07-24 (×3): 81 mg via ORAL
  Filled 2019-07-22 (×4): qty 1

## 2019-07-22 MED ORDER — ACETAMINOPHEN 650 MG RE SUPP: 650.0000 mg | Freq: Four times a day (QID) | RECTAL | Status: DC | PRN

## 2019-07-22 MED ORDER — ACETAMINOPHEN 325 MG PO TABS: 650.0000 mg | ORAL_TABLET | Freq: Four times a day (QID) | ORAL | Status: DC | PRN

## 2019-07-22 MED ORDER — ENOXAPARIN SODIUM 40 MG/0.4ML ~~LOC~~ SOLN
40.0000 mg | SUBCUTANEOUS | Status: DC
  Administered 2019-07-22 – 2019-07-24 (×3): 40 mg via SUBCUTANEOUS
  Filled 2019-07-22 (×3): qty 0.4

## 2019-07-22 MED ORDER — PRAVASTATIN SODIUM 40 MG PO TABS
40.0000 mg | ORAL_TABLET | Freq: Every day | ORAL | Status: DC
  Administered 2019-07-22 – 2019-07-24 (×3): 40 mg via ORAL
  Filled 2019-07-22 (×3): qty 1

## 2019-07-22 NOTE — H&P (Addendum)
Date: 07/22/2019               Patient Name:  Sarah Branch MRN: [id number]  DOB: 1946-04-26 Age / Sex: 73 y.o., female   PCP: Kathi Ludwig, MD         Medical Service: Internal Medicine Teaching Service         Attending Physician: Dr. Evette Doffing, Mallie Mussel, *    First Contact: Dr. Court Joy Pager: [phone]  Second Contact: Dr. Sherry Ruffing  Pager: [phone]       After Hours (After 5p/  First Contact Pager: [phone]  weekends / holidays): Second Contact Pager: [phone]   Chief Complaint: Tired and short of breath  History of Present Illness: Sarah Branch 73 year old woman with past medical history of hypertension, type 2 diabetes, and hyperlipidemia who presents with acute episode of shortness of breath and fatigue.  Patient reports waking up about 4 AM this morning short of breath.  She says every time she lies down her shortness of breath worsens, but sitting up relieves this feeling.  Shortness of breath continued throughout the morning and she said she couldn't do much without feeling tired.  She called her sister and then decided to call EMS. Patients sister is in the room with her on exam and says she believe her sister is extra stressed because it is one year anniversary of losing her husband. Patient is visibly emotional and says she has been having a hard time this week. On July 22nd , PCP changed hypertension medications due to hypercalcemia related to HCTZ. On Olmesartan 20 mg, but has not taken medication today. No recent change in diet, however she does normally season her food heavily with salt when cooking. Reports she does not check BP at home, but has felt fine until this morning. Denies any dizziness, chest pain, nausea, palpitations, history of heart failure, melena, dysuria, polyuria, or recent illness.  EMS reports SBP 210 , O2 Sat in 80's and given 2L , Sats in 90's . Given nitro and IV lasix.    Meds:  Current Meds  Medication Sig  . aspirin 81 MG EC tablet  Take 1 tablet (81 mg total) by mouth daily.  . EQ LORATADINE 10 MG tablet TAKE ONE TABLET BY MOUTH ONCE DAILY (Patient taking differently: Take 10 mg by mouth daily. )  . metFORMIN (GLUCOPHAGE) 500 MG tablet TAKE 2 TABLETS IN THE MORNING AND TAKE 1 TABLET IN THE EVENING DAILY (Patient taking differently: Take 500-1,000 mg by mouth See admin instructions. TAKE 2 TABLETS IN THE MORNING AND TAKE 1 TABLET IN THE EVENING DAILY)  . olmesartan (BENICAR) 20 MG tablet Take 1 tablet (20 mg total) by mouth daily.  . pravastatin (PRAVACHOL) 40 MG tablet Take 1 tablet (40 mg total) by mouth daily.     Allergies: Allergies as of 07/22/2019 - Review Complete 07/22/2019  Allergen Reaction Noted  . Lisinopril Cough 11/04/2016   Past Medical History:  Diagnosis Date  . Anemia    -NOS- Positive stool guaiac cards, refuses colonoscopy  . Dizziness 02/23/2018  . Exposure to second hand smoke   . Hypercalcemia    NI SPEP, UPEP, PTH  . Hyperlipidemia   . Hypertension   . Hypokalemia    2nd diuretics  . Proteinuria   . Shoulder pain    Work Related    Family History:  Family History  Problem Relation Age of Onset  . Cancer Mother   . Vision loss Father  Social History:  Social History   Tobacco Use  . Smoking status: Never Smoker  . Smokeless tobacco: Never Used  . Tobacco comment: Husband smoked around for years  Substance Use Topics  . Alcohol use: No    Alcohol/week: 0.0 standard drinks  . Drug use: No   Widow, lives at home alone. Sister is in the room and reports this is her closest contact.   Review of Systems: A complete ROS was negative except as per HPI.   Physical Exam: Blood pressure (!) 192/83, pulse (!) 102, temperature 98.6 F (37 C), temperature source Oral, resp. rate 20, height 5\' 4"  (1.626 m), weight 84.4 kg, last menstrual period 01/14/1995, SpO2 97 %.  Physical Exam Constitutional:      Appearance: She is not ill-appearing.  HENT:     Mouth/Throat:      Mouth: Mucous membranes are moist.     Pharynx: Oropharynx is clear. No oropharyngeal exudate.  Neck:     Vascular: No JVD.  Cardiovascular:     Rate and Rhythm: Regular rhythm. Tachycardia present.  Pulmonary:     Breath sounds: Rales present. No wheezing or rhonchi.  Musculoskeletal:     Right lower leg: No edema.     Left lower leg: No edema.  Skin:    General: Skin is warm and dry.  Neurological:     Mental Status: She is alert and oriented to person, place, and time.  Psychiatric:        Behavior: Behavior normal. Behavior is not agitated.      EKG: personally reviewed my interpretation is Sinus Tach , premature ventricular complexes  CXR: Bilateral patchy opacities in the lungs  Assessment & Plan by Problem: Active Problems:   Hypertensive urgency  73 year old women with hypertension who comes in with dyspnea and fatigue.   #Dypsnea and fatigue Patient found to have SBP of 210, given nitro and IV lasix. Output 2.2 L. Patient has not taken medication today. Recent medication changes as described above. Will start irbesartan and slowly normalize BP in next 24-48 hrs. Bilateral patchy opacities seen on xray. Patient without cough, fever , or chest pain. Orthopnea and PND. Possibly flash pulmonary edema from hypertension. BNP 111. Last echo EF 55-60%, in 2018 for EKG which indicated atrial arrhythmia. Sinus tach, with PVC's. - Telemetry - Irbesartan 300 mg - Aspirin 81mg  - Lasix  #HLD - continue pravastatin 40 mg   Diet: Carb modified IV fluids: none DVT ppx : Lovenox Code Status: Full    Dispo: Admit patient to Observation with expected length of stay less than 2 midnights.  Signed:  Thurmon FairJeff Denice Cardon, MD PGY1  [phone]

## 2019-07-22 NOTE — ED Notes (Signed)
Patient is breathing much better

## 2019-07-22 NOTE — ED Notes (Signed)
Report given Stephanie RN.

## 2019-07-22 NOTE — ED Triage Notes (Signed)
Patient presents to ed via [facility] states she was slightly sob last pm states she had to sit up several times during the night to breath, however any movement made her more sob. States this am was much worse. C/o non-productie cough. Denies pain able to speak in complete sentences

## 2019-07-22 NOTE — Progress Notes (Signed)
Patient's BP still high, SBP >180 and DYS > 100. Patient is asymptomatic. On call IM intern Dr. Gilford Rile made aware. Per Dr. Gilford Rile continue to monitor pt and notify him if pt becomes symptomatic.  Will continue to monitor.

## 2019-07-22 NOTE — ED Provider Notes (Addendum)
[facility] EMERGENCY DEPARTMENT Provider Note   CSN: [id number] Arrival date & time: 07/22/19  3244     History   Chief Complaint Chief Complaint  Patient presents with  . Shortness of Breath    HPI Sarah Branch is a 73 y.o. female.     HPI  73 yo female with ho hypertension, hyperlipidemia presents today with dyspnea.  She reports some dyspnea last night worsening during night improved with sitting up on side of bed.  She denies prior similar symptoms.  She has had some associated np cough.  No fever, sore throat, nausea, vomiting, diarrhea, fever, or chills.  No known covid exposure and has been mostly at home.   EMS reports initials sats 80s, sbp 200.  Patient received nitro x 2 prehospital and oxygen via [place] with improved symptoms Denies chest pain or leg swelling. No history of dvt or pe PMD IM clinic Dr.Lawrence   Past Medical History:  Diagnosis Date  . Anemia    -NOS- Positive stool guaiac cards, refuses colonoscopy  . Dizziness 02/23/2018  . Exposure to second hand smoke   . Hypercalcemia    NI SPEP, UPEP, PTH  . Hyperlipidemia   . Hypertension   . Hypokalemia    2nd diuretics  . Proteinuria   . Shoulder pain    Work Related    Patient Active Problem List   Diagnosis Date Noted  . Need for hepatitis C screening test 06/07/2019  . Irregular cardiac rhythm 06/07/2019  . Need for influenza vaccination 08/25/2018  . Atrial arrhythmia 07/28/2017  . Sinus pause 05/06/2016  . Breast cancer screening 07/19/2014  . Stage 2 chronic kidney disease due to type 2 diabetes mellitus ([place]) 12/02/2013  . Colon cancer screening 04/05/2013  . Special screening for malignant neoplasms, colon 03/14/2012  . Diabetes mellitus without complication ([place]) 11/18/7251  . Hyperlipidemia 11/06/2006  . Hypercalcemia 11/06/2006  . Essential hypertension 11/06/2006    No past surgical history on file.   OB History   No obstetric history on file.       Home Medications    Prior to Admission medications   Medication Sig Start Date End Date Taking? Authorizing Provider  aspirin 81 MG EC tablet Take 1 tablet (81 mg total) by mouth daily. 10/30/11   Hadassah Pais, MD  EQ LORATADINE 10 MG tablet TAKE ONE TABLET BY MOUTH ONCE DAILY 07/14/16   Burns, Arloa Koh, MD  meclizine (ANTIVERT) 12.5 MG tablet Take 1 tablet (12.5 mg total) by mouth 2 (two) times daily. 04/24/18   Glyn Ade, PA-C  metFORMIN (GLUCOPHAGE) 500 MG tablet TAKE 2 TABLETS IN THE MORNING AND TAKE 1 TABLET IN THE EVENING DAILY 04/21/19   Kathi Ludwig, MD  olmesartan (BENICAR) 20 MG tablet Take 1 tablet (20 mg total) by mouth daily. 06/07/19   Kathi Ludwig, MD  pravastatin (PRAVACHOL) 40 MG tablet Take 1 tablet (40 mg total) by mouth daily. 06/07/19   Kathi Ludwig, MD    Family History Family History  Problem Relation Age of Onset  . Cancer Mother   . Vision loss Father     Social History Social History   Tobacco Use  . Smoking status: Never Smoker  . Smokeless tobacco: Never Used  . Tobacco comment: Husband smoked around for years  Substance Use Topics  . Alcohol use: No    Alcohol/week: 0.0 standard drinks  . Drug use: No     Allergies   Lisinopril  Review of Systems Review of Systems  All other systems reviewed and are negative.    Physical Exam Updated Vital Signs LMP 01/14/1995   Physical Exam Vitals signs and nursing note reviewed.  Constitutional:      General: She is in acute distress.     Appearance: She is normal weight.  HENT:     Head: Normocephalic.     Mouth/Throat:     Mouth: Mucous membranes are moist.  Eyes:     Pupils: Pupils are equal, round, and reactive to light.  Neck:     Musculoskeletal: Normal range of motion.  Cardiovascular:     Rate and Rhythm: Regular rhythm. Tachycardia present.  Pulmonary:     Effort: Tachypnea present.     Breath sounds: Examination of the right-lower field reveals rhonchi.  Examination of the left-lower field reveals rhonchi. Rhonchi present.  Abdominal:     Palpations: Abdomen is soft.  Musculoskeletal: Normal range of motion.     Comments: Trace edema bilaterally  Skin:    Capillary Refill: Capillary refill takes less than 2 seconds.  Neurological:     Mental Status: She is alert.      ED Treatments / Results  Labs (all labs ordered are listed, but only abnormal results are displayed) Labs Reviewed  CBC  COMPREHENSIVE METABOLIC PANEL  BRAIN NATRIURETIC PEPTIDE  TROPONIN I (HIGH SENSITIVITY)    EKG EKG Interpretation  Date/Time:  Saturday July 22 2019 09:23:54 EDT Ventricular Rate:  122 PR Interval:    QRS Duration: 92 QT Interval:  319 QTC Calculation: 455 R Axis:   73 Text Interpretation:  Sinus tachycardia Multiform ventricular premature complexes Borderline repolarization abnormality Confirmed by Margarita Grizzleay, Sheralee Qazi [phone]([id number]) on 07/22/2019 9:40:44 AM   Radiology Dg Chest Port 1 View  Result Date: 07/22/2019 CLINICAL DATA:  Pt complains of SOB. Hx of HTN and diabetes. EXAM: PORTABLE CHEST 1 VIEW COMPARISON:  03/21/2015 FINDINGS: Heart is enlarged and accentuated by portable technique. There are hazy patchy opacities in the lungs bilaterally without confluent opacity. No pleural effusions. Chronic changes in both shoulders. IMPRESSION: Bilateral patchy opacities in the lungs, favoring infectious or inflammatory process over pulmonary edema. Electronically Signed   By: Norva PavlovElizabeth  Brown M.D.   On: 07/22/2019 10:07    Procedures .Critical Care Performed by: Margarita Grizzleay, Manon Banbury, MD Authorized by: Margarita Grizzleay, Gari Hartsell, MD   Critical care provider statement:    Critical care time (minutes):  45   Critical care end time:  07/22/2019 11:37 AM   Critical care was time spent personally by me on the following activities:  Discussions with consultants, evaluation of patient's response to treatment, examination of patient, ordering and performing treatments and  interventions, ordering and review of laboratory studies, ordering and review of radiographic studies, pulse oximetry, re-evaluation of patient's condition, obtaining history from patient or surrogate and review of old charts   (including critical care time)  Medications Ordered in ED Medications  furosemide (LASIX) injection 60 mg (has no administration in time range)     Initial Impression / Assessment and Plan / ED Course  I have reviewed the triage vital signs and the nursing notes.  Pertinent labs & imaging results that were available during my care of the patient were reviewed by me and considered in my medical decision making (see chart for details).      73 year old female history of hypertension presents today with worsening dyspnea through the night. x-Leonela Kivi with increased markings and patchy bilateral opacities.  Patient improved here  with prehospital nitro and IV Lasix.  Initial oxygen saturations upon EMS arrival were in the 80s , SBP 210and here she is in the upper 90s on 2 L.  Patient is asymptomatic for COVID, but due to dyspnea and patchy bilateral opacities, COVID test obtained which is negative. Patient followed at IM clinic Plan consult for admission Discussed with Dr. Christianne Dolin Final Clinical Impressions(s) / ED Diagnoses   Final diagnoses:  Acute pulmonary edema (HCC)  Hypertension, unspecified type  Acute dyspnea  Hypoxia    ED Discharge Orders    None       Margarita Grizzle, MD 07/22/19 1137    Margarita Grizzle, MD 07/22/19 1224

## 2019-07-23 ENCOUNTER — Observation Stay (HOSPITAL_BASED_OUTPATIENT_CLINIC_OR_DEPARTMENT_OTHER): Payer: Medicare Other

## 2019-07-23 DIAGNOSIS — I160 Hypertensive urgency: Secondary | ICD-10-CM | POA: Diagnosis present

## 2019-07-23 DIAGNOSIS — I110 Hypertensive heart disease with heart failure: Secondary | ICD-10-CM | POA: Diagnosis present

## 2019-07-23 DIAGNOSIS — Z7982 Long term (current) use of aspirin: Secondary | ICD-10-CM | POA: Diagnosis not present

## 2019-07-23 DIAGNOSIS — E119 Type 2 diabetes mellitus without complications: Secondary | ICD-10-CM | POA: Diagnosis present

## 2019-07-23 DIAGNOSIS — Z79899 Other long term (current) drug therapy: Secondary | ICD-10-CM | POA: Diagnosis not present

## 2019-07-23 DIAGNOSIS — E785 Hyperlipidemia, unspecified: Secondary | ICD-10-CM

## 2019-07-23 DIAGNOSIS — Z20828 Contact with and (suspected) exposure to other viral communicable diseases: Secondary | ICD-10-CM | POA: Diagnosis present

## 2019-07-23 DIAGNOSIS — Z888 Allergy status to other drugs, medicaments and biological substances status: Secondary | ICD-10-CM | POA: Diagnosis not present

## 2019-07-23 DIAGNOSIS — I5020 Unspecified systolic (congestive) heart failure: Secondary | ICD-10-CM | POA: Diagnosis present

## 2019-07-23 DIAGNOSIS — I5021 Acute systolic (congestive) heart failure: Secondary | ICD-10-CM | POA: Diagnosis present

## 2019-07-23 DIAGNOSIS — R0902 Hypoxemia: Secondary | ICD-10-CM | POA: Diagnosis present

## 2019-07-23 DIAGNOSIS — Z7984 Long term (current) use of oral hypoglycemic drugs: Secondary | ICD-10-CM | POA: Diagnosis not present

## 2019-07-23 DIAGNOSIS — I493 Ventricular premature depolarization: Secondary | ICD-10-CM | POA: Diagnosis present

## 2019-07-23 DIAGNOSIS — R0602 Shortness of breath: Secondary | ICD-10-CM | POA: Diagnosis not present

## 2019-07-23 DIAGNOSIS — Z7722 Contact with and (suspected) exposure to environmental tobacco smoke (acute) (chronic): Secondary | ICD-10-CM | POA: Diagnosis present

## 2019-07-23 DIAGNOSIS — I248 Other forms of acute ischemic heart disease: Secondary | ICD-10-CM | POA: Diagnosis present

## 2019-07-23 LAB — CBC
HCT: 40.2 % (ref 36.0–46.0)
Hemoglobin: 12.5 g/dL (ref 12.0–15.0)
MCH: 25.7 pg — ABNORMAL LOW (ref 26.0–34.0)
MCHC: 31.1 g/dL (ref 30.0–36.0)
MCV: 82.7 fL (ref 80.0–100.0)
Platelets: 253 10*3/uL (ref 150–400)
RBC: 4.86 MIL/uL (ref 3.87–5.11)
RDW: 15.7 % — ABNORMAL HIGH (ref 11.5–15.5)
WBC: 7.2 10*3/uL (ref 4.0–10.5)
nRBC: 0 % (ref 0.0–0.2)

## 2019-07-23 LAB — BASIC METABOLIC PANEL
Anion gap: 13 (ref 5–15)
BUN: 11 mg/dL (ref 8–23)
CO2: 27 mmol/L (ref 22–32)
Calcium: 10.4 mg/dL — ABNORMAL HIGH (ref 8.9–10.3)
Chloride: 99 mmol/L (ref 98–111)
Creatinine, Ser: 0.93 mg/dL (ref 0.44–1.00)
GFR calc Af Amer: >60 mL/min (ref >60)
GFR calc non Af Amer: >60 mL/min (ref >60)
Glucose, Bld: 157 mg/dL — ABNORMAL HIGH (ref 70–99)
Potassium: 3.2 mmol/L — ABNORMAL LOW (ref 3.5–5.1)
Sodium: 139 mmol/L (ref 135–145)

## 2019-07-23 LAB — ECHOCARDIOGRAM COMPLETE
Height: 64 in
Weight: 2878.4 oz

## 2019-07-23 LAB — TROPONIN I (HIGH SENSITIVITY): Troponin I (High Sensitivity): 65 ng/L — ABNORMAL HIGH (ref <18)

## 2019-07-23 MED ORDER — AMLODIPINE BESYLATE 5 MG PO TABS
5.0000 mg | ORAL_TABLET | Freq: Every day | ORAL | Status: DC
  Administered 2019-07-23 – 2019-07-24 (×2): 5 mg via ORAL
  Filled 2019-07-23 (×2): qty 1

## 2019-07-23 MED ORDER — POTASSIUM CHLORIDE CRYS ER 20 MEQ PO TBCR
40.0000 meq | EXTENDED_RELEASE_TABLET | Freq: Once | ORAL | Status: AC
  Administered 2019-07-23: 40 meq via ORAL
  Filled 2019-07-23: qty 2

## 2019-07-23 NOTE — Care Management Obs Status (Signed)
MEDICARE OBSERVATION STATUS NOTIFICATION   Patient Details  Name: LEVA BAINE MRN: [id number] Date of Birth: [birth year]   Medicare Observation Status Notification Given:  Yes    Wende Neighbors, LCSW 07/23/2019, 10:38 AM

## 2019-07-23 NOTE — Progress Notes (Signed)
  Echocardiogram 2D Echocardiogram has been performed.  Jennette Dubin 07/23/2019, 12:06 PM

## 2019-07-23 NOTE — Care Management Obs Status (Signed)
MEDICARE OBSERVATION STATUS NOTIFICATION   Patient Details  Name: Sarah [facility] MRN: [id number] Date of Birth: 21-Dec-1945   Medicare Observation Status Notification Given:  Yes    Wende Neighbors, LCSW 07/23/2019, 10:50 AM

## 2019-07-23 NOTE — Progress Notes (Addendum)
Internal Medicine Teaching Service Attending:   I saw and examined the patient. I reviewed the resident's note and I agree with the resident's findings and plan as documented in the resident's note.  Principal Problem:   Acute HFrEF (heart failure with reduced ejection fraction) (HCC) Active Problems:   Diabetes mellitus without complication ([place])   Severe hypertension   Hospital day #1 for this 73 year old woman admitted with new diagnosis of acute heart failure with mildly reduced EF. Likely she had flash pulmonary edema due to severe hypertension in the setting of the heart failure. She appears euvolemic on exam today. We have increased olmesartan dose and added amlodipine for better hypertension control. Will avoid short acting or IV antihypertensives as long as she remains asymptomatic. She will warrant ischemic evaluation given the reduced EF, but I suspect this CHF is from longstanding hypertension. If BP remains under good control and her symptoms are controlled, may be ready for discharge to home tomorrow.  Lalla Brothers, MD FACP

## 2019-07-23 NOTE — Progress Notes (Signed)
   Subjective: Patient reports improvement in her breathing today.  Plan for further work-up is discussed. Patient denies chest pain.  Objective:  Vital signs in last 24 hours: Vitals:   07/23/19 0454 07/23/19 0921 07/23/19 1354 07/23/19 1529  BP: 129/63 (!) 172/101 (!) 139/91 (!) 153/102  Pulse: 75  77   Resp: 20     Temp: 97.9 F (36.6 C)  98 F (36.7 C)   TempSrc: Oral  Oral   SpO2: 96%  96%   Weight: 81.6 kg     Height:       Physical Exam Constitutional:      Appearance: She is well-developed.  Neck:     Vascular: No JVD.  Cardiovascular:     Rate and Rhythm: Regular rhythm. Tachycardia present.  Pulmonary:     Effort: Pulmonary effort is normal.     Breath sounds: Examination of the right-lower field reveals rales. Examination of the left-lower field reveals rales. Rales present. No wheezing or rhonchi.  Musculoskeletal:     Right lower leg: No edema.     Left lower leg: No edema.  Neurological:     Mental Status: She is alert.      Assessment/Plan:  Principal Problem:   Acute pulmonary edema (HCC) Active Problems:   Diabetes mellitus without complication (HCC)   Severe hypertension  73 year old women with hypertension who comes in with dyspnea and fatigue.   #Severe hypertension Patient found to have SBP of 210, given nitro and IV lasix. Output 2.2 L. Patient has not taken medication today. Recent medication changes as described above. Bilateral patchy opacities seen on xray. Patient without signs of pneumonia.  Endorsing orthopnea.  Started on irbesartan.  Patient remains hypertensive, added amlodipine 5 mg.  Is coming on anniversary of losing her husband 1 year ago and at risk for a stress-induced cardiomyopathy. TTE today to evaluate. Elevated Troponin has peaked , no ischemic signs on EKG. - Irbesartan 300 mg  - Amlodipine 5mg  - Aspirin 81mg  - Lasix -TTE   #HLD - continue pravastatin 40 mg   Diet: Carb modified IV fluids: none DVT ppx :  Lovenox Code Status: Full    Dispo: Anticipated discharge with clinical improvement Tamsen Snider, MD PGY1  [phone]

## 2019-07-24 LAB — BASIC METABOLIC PANEL
Anion gap: 9 (ref 5–15)
BUN: 15 mg/dL (ref 8–23)
CO2: 28 mmol/L (ref 22–32)
Calcium: 9.8 mg/dL (ref 8.9–10.3)
Chloride: 100 mmol/L (ref 98–111)
Creatinine, Ser: 0.98 mg/dL (ref 0.44–1.00)
GFR calc Af Amer: >60 mL/min (ref >60)
GFR calc non Af Amer: 57 mL/min — ABNORMAL LOW (ref >60)
Glucose, Bld: 151 mg/dL — ABNORMAL HIGH (ref 70–99)
Potassium: 3.3 mmol/L — ABNORMAL LOW (ref 3.5–5.1)
Sodium: 137 mmol/L (ref 135–145)

## 2019-07-24 LAB — MAGNESIUM: Magnesium: 1.5 mg/dL — ABNORMAL LOW (ref 1.7–2.4)

## 2019-07-24 MED ORDER — CARVEDILOL 3.125 MG PO TABS: 3.1250 mg | ORAL_TABLET | Freq: Two times a day (BID) | ORAL | 1 refills | Status: DC

## 2019-07-24 MED ORDER — AMLODIPINE BESYLATE 5 MG PO TABS: 5.0000 mg | ORAL_TABLET | Freq: Every day | ORAL | 3 refills | Status: DC

## 2019-07-24 MED ORDER — OLMESARTAN MEDOXOMIL 40 MG PO TABS: 40.0000 mg | ORAL_TABLET | Freq: Every day | ORAL | 0 refills | Status: DC

## 2019-07-24 MED ORDER — CARVEDILOL 3.125 MG PO TABS
3.1250 mg | ORAL_TABLET | Freq: Two times a day (BID) | ORAL | Status: DC
  Administered 2019-07-24: 17:00:00 3.125 mg via ORAL
  Filled 2019-07-24: qty 1

## 2019-07-24 MED ORDER — MAGNESIUM SULFATE 4 GM/100ML IV SOLN
4.0000 g | Freq: Once | INTRAVENOUS | Status: AC
  Administered 2019-07-24: 17:00:00 4 g via INTRAVENOUS
  Filled 2019-07-24: qty 100

## 2019-07-24 MED ORDER — POTASSIUM CHLORIDE CRYS ER 20 MEQ PO TBCR
30.0000 meq | EXTENDED_RELEASE_TABLET | Freq: Two times a day (BID) | ORAL | Status: DC
  Administered 2019-07-24: 30 meq via ORAL
  Filled 2019-07-24: qty 1

## 2019-07-24 NOTE — Progress Notes (Signed)
   Subjective: Patient reports that she is feeling better. She reports that her breathing has been better, denies any chest pain, palpitations, or lower extremity swelling.   Objective:  Vital signs in last 24 hours: Vitals:   07/23/19 1354 07/23/19 1529 07/23/19 2128 07/24/19 0451  BP: (!) 139/91 (!) 153/102 (!) 142/84 (!) 145/78  Pulse: 77  (!) 58 82  Resp:   20 20  Temp: 98 F (36.7 C)  97.9 F (36.6 C) 98.2 F (36.8 C)  TempSrc: Oral  Oral Oral  SpO2: 96%  95% 98%  Weight:    83 kg  Height:        General: Middle aged female, NAD, sitting up in bed Cardiac: No LE edema, rrr, no m/r/g Pulmonary: CTAB, no w/r/r Abdomen: Soft, non-tender, non-distended, normoactive bowel sounds   Assessment/Plan:  Principal Problem:   Acute HFrEF (heart failure with reduced ejection fraction) (HCC) Active Problems:   Diabetes mellitus without complication (HCC)   Severe hypertension  17 year oldwomenwith hypertension who comes inwith dyspnea and fatigue.   Patient found to haveSBPof 210, given nitro and IV lasix. Output 2.2 L. Patient had not taken medication. Recent BP medication changes due to hypercalcemia. Bilateral patchy opacities seen on xray. Patient without signs of pneumonia.  Endorsing orthopnea.  Started on irbesartan.  Patient remained hypertensive, added amlodipine 5 mg.    #Severe hypertension # New HFmrEF Mild global hypokinesis on TTE. Patient is coming up on anniversary of losing her husband 1 year ago and at risk for a stress-induced cardiomyopathy.  Patient asymptomatic and blood pressure better controlled. Plan to start beta blocker and schedule ischemic evaluation in outpatient setting. - Irbesartan 300 mg  - Amlodipine 5mg  -  Coreg 3.125 BID and follow up with PCP within 2 weeks - Aspirin 81mg  - ambulate and check pulse ox - replete K,Mg  #HLD - continue pravastatin 40 mg

## 2019-07-24 NOTE — Discharge Instructions (Addendum)
Sarah Branch,  It was our pleasure to take care of you and we are glad you are feeling better.  When you came in your blood pressure was extremely elevated. We have made adjustments to your medications and added medications. In addition we found your heart function is mildly reduced. The cause of your mild heart failure is unknown, so we would like you to follow up in cardiology clinic to be evaluated. We are recommending the following treatment at this time:  -  Olmesartan 40 mg - Amlodipine 5 mg - Carvedilol 3.125 twice daily - Continue Asprin , pravastatin, loratadine , and metformin  Please follow up Dr. Berline Lopes in 1-2 weeks.  My best,  Dr.Thaddus Mcdowell

## 2019-07-24 NOTE — Plan of Care (Signed)
  Problem: Education: Goal: Knowledge of General Education information will improve Description: Including pain rating scale, medication(s)/side effects and non-pharmacologic comfort measures Outcome: Completed/Met   Problem: Health Behavior/Discharge Planning: Goal: Ability to manage health-related needs will improve Outcome: Completed/Met   Problem: Clinical Measurements: Goal: Ability to maintain clinical measurements within normal limits will improve Outcome: Completed/Met Goal: Diagnostic test results will improve Outcome: Completed/Met Goal: Respiratory complications will improve Outcome: Completed/Met Goal: Cardiovascular complication will be avoided Outcome: Completed/Met   Problem: Activity: Goal: Risk for activity intolerance will decrease Outcome: Completed/Met   Problem: Nutrition: Goal: Adequate nutrition will be maintained Outcome: Completed/Met   Problem: Elimination: Goal: Will not experience complications related to bowel motility Outcome: Completed/Met   Problem: Pain Managment: Goal: General experience of comfort will improve Outcome: Completed/Met   Problem: Skin Integrity: Goal: Risk for impaired skin integrity will decrease Outcome: Completed/Met

## 2019-07-24 NOTE — Progress Notes (Signed)
SATURATION QUALIFICATIONS: (This note is used to comply with regulatory documentation for home oxygen)  Patient Saturations on Room Air at Rest = 100%  Patient Saturations on Room Air while Ambulating = 97% - 100%

## 2019-07-27 NOTE — Discharge Summary (Signed)
Name: Sarah Branch MRN: [id number] DOB: Oct 23, 1946 73 y.o. PCP: Lanelle BalHarbrecht, Lawrence, MD  Date of Admission: 07/22/2019  9:14 AM Date of Discharge: 07/24/2019 Attending Physician: Pennie Rushingr.Duncan Vincent Discharge Diagnosis: 1. Acute Heart Failure with Mildly Reduce Ejection Fraction  2. HTN  Discharge Medications: Allergies as of 07/24/2019      Reactions   Lisinopril Cough      Medication List    TAKE these medications   amLODipine 5 MG tablet Commonly known as: NORVASC Take 1 tablet (5 mg total) by mouth daily.   aspirin 81 MG EC tablet Take 1 tablet (81 mg total) by mouth daily.   carvedilol 3.125 MG tablet Commonly known as: COREG Take 1 tablet (3.125 mg total) by mouth 2 (two) times daily with a meal.   EQ Loratadine 10 MG tablet Generic drug: loratadine TAKE ONE TABLET BY MOUTH ONCE DAILY What changed: how much to take   metFORMIN 500 MG tablet Commonly known as: GLUCOPHAGE TAKE 2 TABLETS IN THE MORNING AND TAKE 1 TABLET IN THE EVENING DAILY What changed:   how much to take  how to take this  when to take this   olmesartan 40 MG tablet Commonly known as: BENICAR Take 1 tablet (40 mg total) by mouth daily. What changed:   medication strength  how much to take   pravastatin 40 MG tablet Commonly known as: PRAVACHOL Take 1 tablet (40 mg total) by mouth daily.       Disposition and follow-up:   [name] [facility] St[facility].  At the hospital follow up visit please address:  1.  Acute HFmrEF  - started Coreg 3.125 BID , titrate - On  asprin and statin - schedule ischemic evaluation in outpatient setting.  HTN -  Olmesartan 40 mg - Amlodipine 5 mg - Carvedilol 3.125 twice daily  2.  Labs / imaging needed at time of follow-up:   3.  Pending labs/ test needing follow-up:   Follow-up Appointments:   Hospital Course by problem list:   HFmrEF Flash Pulmonary Edema 2/2 HTN Patient  presented with severe hypertension and flash pulmonary edema in setting of heart failure. We adjusted Olmesartan dose and added Amlodipine for better BP control. Mildly reduced EF on TTE.  No increase in LV wall thickness, EF 45-50% Patient is coming up on anniversary of losing her husband 1 year ago and at risk for a stress-induced cardiomyopathy. Need ischemic evaluation. Patient started on Coreg on day of discharge. Will have follow-up with PCP.   Discharge Vitals:   BP (!) 132/93 (BP Location: Right Arm)   Pulse 100   Temp 97.9 F (36.6 C) (Oral)   Resp 20   Ht 5\' 4"  (1.626 m)   Wt 83 kg   LMP 01/14/1995   SpO2 100%   BMI 31.39 kg/m   Pertinent Labs, Studies, and Procedures:  CBC Latest Ref Rng & Units 07/23/2019 07/22/2019 11/04/2016  WBC 4.0 - 10.5 K/uL 7.2 7.6 6.2  Hemoglobin 12.0 - 15.0 g/dL 40.912.5 81.112.2 91.411.3  Hematocrit 36.0 - 46.0 % 40.2 39.0 36.3  Platelets 150 - 400 K/uL 253 213 243   BMP Latest Ref Rng & Units 07/24/2019 07/23/2019 07/22/2019  Glucose 70 - 99 mg/dL 782(N151(H) 562(Z157(H) 308(M186(H)  BUN 8 - 23 mg/dL 15 11 7(L)  Creatinine 0.44 - 1.00 mg/dL 5.780.98 4.690.93 6.290.95  BUN/Creat Ratio 12 - 28 - - -  Sodium 135 - 145 mmol/L 137  139 137  Potassium 3.5 - 5.1 mmol/L 3.3(L) 3.2(L) 3.2(L)  Chloride 98 - 111 mmol/L 100 99 99  CO2 22 - 32 mmol/L 28 27 25   Calcium 8.9 - 10.3 mg/dL 9.8 10.4(H) 10.1     TEE on 07/23/2019 1. The left ventricle has mildly reduced systolic function, with an ejection fraction of 45-50%. Mild global hypokinesis. The cavity size was normal. Left ventricular diastolic Doppler parameters are indeterminate. 2. The right ventricle has normal systolic function. The cavity was normal. There is no increase in right ventricular wall thickness. 3. The aortic valve is tricuspid. Aortic valve regurgitation is mild by color flow Doppler.  Left Ventricle: The left ventricle has mildly reduced systolic function, with an ejection fraction of 45-50%. The cavity size was normal.  There is no increase in left ventricular wall thickness. Left ventricular diastolic Doppler parameters are indeterminate. Right Ventricle: The right ventricle has normal systolic function. The cavity was normal. There is no increase in right ventricular wall thickness. Left Atrium: Left atrial size was mildly dilated. Right Atrium: Right atrial size was normal in size. Interatrial Septum: The atrial septum is grossly normal. Pericardium: Trivial pericardial effusion is present. Mitral Valve: The mitral valve is normal in structure. Mitral valve regurgitation is trivial by color flow Doppler. Tricuspid Valve: The tricuspid valve is normal in structure. Tricuspid valve regurgitation is trivial by color flow Doppler. Aortic Valve: The aortic valve is tricuspid Aortic valve regurgitation is mild by color flow Doppler. There i   Discharge Instructions: Discharge Instructions    (HEART FAILURE PATIENTS) Call MD:  Anytime you have any of the following symptoms: 1) 3 pound weight gain in 24 hours or 5 pounds in 1 week 2) shortness of breath, with or without a dry hacking cough 3) swelling in the hands, feet or stomach 4) if you have to sleep on extra pillows at night in order to breathe.   Complete by: As directed    Call MD for:  difficulty breathing, headache or visual disturbances   Complete by: As directed    Call MD for:  persistant dizziness or light-headedness   Complete by: As directed    Diet - low sodium heart healthy   Complete by: As directed    Increase activity slowly   Complete by: As directed       Signed:  Tamsen Snider, MD PGY1  [phone]

## 2019-08-18 ENCOUNTER — Other Ambulatory Visit: Payer: Self-pay | Admitting: Internal Medicine

## 2019-08-18 DIAGNOSIS — I10 Essential (primary) hypertension: Secondary | ICD-10-CM

## 2019-08-29 ENCOUNTER — Other Ambulatory Visit: Payer: Self-pay

## 2019-08-29 ENCOUNTER — Ambulatory Visit
Admission: RE | Admit: 2019-08-29 | Discharge: 2019-08-29 | Disposition: A | Discharge status: Home / Self Care | Payer: Medicare Other | Source: Ambulatory Visit | Attending: Internal Medicine | Admitting: Internal Medicine

## 2019-08-29 DIAGNOSIS — Z1231 Encounter for screening mammogram for malignant neoplasm of breast: Secondary | ICD-10-CM

## 2019-08-31 ENCOUNTER — Encounter: Payer: Self-pay | Admitting: *Deleted

## 2019-08-31 ENCOUNTER — Other Ambulatory Visit: Payer: Self-pay | Admitting: Internal Medicine

## 2019-08-31 DIAGNOSIS — R928 Other abnormal and inconclusive findings on diagnostic imaging of breast: Secondary | ICD-10-CM

## 2019-08-31 NOTE — Progress Notes (Unsigned)

## 2019-08-31 NOTE — Progress Notes (Unsigned)

## 2019-09-05 ENCOUNTER — Ambulatory Visit
Admission: RE | Admit: 2019-09-05 | Discharge: 2019-09-05 | Disposition: A | Discharge status: Home / Self Care | Payer: Medicare Other | Source: Ambulatory Visit | Attending: Internal Medicine | Admitting: Internal Medicine

## 2019-09-05 ENCOUNTER — Other Ambulatory Visit: Payer: Self-pay

## 2019-09-05 ENCOUNTER — Other Ambulatory Visit: Payer: Self-pay | Admitting: Internal Medicine

## 2019-09-05 DIAGNOSIS — R928 Other abnormal and inconclusive findings on diagnostic imaging of breast: Secondary | ICD-10-CM

## 2019-09-05 DIAGNOSIS — N6320 Unspecified lump in the left breast, unspecified quadrant: Secondary | ICD-10-CM

## 2019-09-05 DIAGNOSIS — N6322 Unspecified lump in the left breast, upper inner quadrant: Secondary | ICD-10-CM | POA: Diagnosis not present

## 2019-09-05 DIAGNOSIS — N6489 Other specified disorders of breast: Secondary | ICD-10-CM | POA: Diagnosis not present

## 2019-09-11 ENCOUNTER — Ambulatory Visit
Admission: RE | Admit: 2019-09-11 | Discharge: 2019-09-11 | Disposition: A | Discharge status: Home / Self Care | Payer: Medicare Other | Source: Ambulatory Visit | Attending: Internal Medicine | Admitting: Internal Medicine

## 2019-09-11 ENCOUNTER — Other Ambulatory Visit: Payer: Self-pay

## 2019-09-11 DIAGNOSIS — N6320 Unspecified lump in the left breast, unspecified quadrant: Secondary | ICD-10-CM

## 2019-09-11 DIAGNOSIS — N6322 Unspecified lump in the left breast, upper inner quadrant: Secondary | ICD-10-CM | POA: Diagnosis not present

## 2019-09-13 ENCOUNTER — Other Ambulatory Visit: Payer: Self-pay | Admitting: Internal Medicine

## 2019-09-13 DIAGNOSIS — I10 Essential (primary) hypertension: Secondary | ICD-10-CM

## 2019-10-16 ENCOUNTER — Other Ambulatory Visit: Payer: Self-pay | Admitting: Internal Medicine

## 2019-10-16 DIAGNOSIS — I10 Essential (primary) hypertension: Secondary | ICD-10-CM

## 2019-10-24 ENCOUNTER — Encounter: Payer: Self-pay | Admitting: *Deleted

## 2019-10-24 DIAGNOSIS — Z961 Presence of intraocular lens: Secondary | ICD-10-CM | POA: Diagnosis not present

## 2019-10-24 DIAGNOSIS — E119 Type 2 diabetes mellitus without complications: Secondary | ICD-10-CM | POA: Diagnosis not present

## 2019-10-24 DIAGNOSIS — H26492 Other secondary cataract, left eye: Secondary | ICD-10-CM | POA: Diagnosis not present

## 2019-10-24 LAB — HM DIABETES EYE EXAM

## 2019-11-01 ENCOUNTER — Encounter: Payer: Medicare Other | Admitting: Internal Medicine

## 2019-11-13 ENCOUNTER — Other Ambulatory Visit: Payer: Self-pay | Admitting: Internal Medicine

## 2019-12-26 NOTE — Progress Notes (Signed)
   CC: HTN, DMII and CHF  HPI:[name] is a 74 y.o. female who presents for evaluation of DMII, HTN and CHF. Please see individual problem based A/P for details.  Depression, PHQ-9: Based on the patients    Office Visit from 11/28/2018 in [facility]  PHQ-9 Total Score  3     score we have decided to monitor.  Past Medical History:  Diagnosis Date  . Anemia    -NOS- Positive stool guaiac cards, refuses colonoscopy  . Dizziness 02/23/2018  . Exposure to second hand smoke   . Hypercalcemia    NI SPEP, UPEP, PTH  . Hyperlipidemia   . Hypertension   . Hypokalemia    2nd diuretics  . Proteinuria   . Shoulder pain    Work Related   Review of Systems:  ROS negative except as per HPI.  Physical Exam: Vitals:   12/27/19 1404 12/27/19 1430  BP: (!) 163/83 (!) 143/80  Pulse: 93 93  Temp: 99.6 F (37.6 C)   TempSrc: Oral   SpO2: 99%   Weight: 186 lb 6.4 oz (84.6 kg)   Height: 5\' 4"  (1.626 m)    Filed Weights   12/27/19 1404  Weight: 186 lb 6.4 oz (84.6 kg)   General: A/O x4, in no acute distress, afebrile, nondiaphoretic HEENT: PEERL, EMO intact Cardio: RRR, no mrg's  Pulmonary: CTA bilaterally,  no wheezing or crackles  Abdomen: Bowel sounds normal, soft, nontender  MSK: BLE nontender, nonedematous Neuro: Alert, CNII-XII grossly intact, conversational, strength 5/5 in the upper and lower extremities bilaterally, normal gait Psych: Appropriate affect, not depressed in appearance, engages well  Assessment & Plan:   See Encounters Tab for problem based charting.  Patient discussed with Dr. 02/24/20

## 2019-12-27 ENCOUNTER — Ambulatory Visit (INDEPENDENT_AMBULATORY_CARE_PROVIDER_SITE_OTHER): Payer: Medicare Other | Admitting: Internal Medicine

## 2019-12-27 ENCOUNTER — Encounter: Payer: Self-pay | Admitting: Internal Medicine

## 2019-12-27 ENCOUNTER — Other Ambulatory Visit: Payer: Self-pay

## 2019-12-27 VITALS — BP 143/80 | HR 93 | Temp 99.6°F | Ht 64.0 in | Wt 186.4 lb

## 2019-12-27 DIAGNOSIS — I5021 Acute systolic (congestive) heart failure: Secondary | ICD-10-CM

## 2019-12-27 DIAGNOSIS — E119 Type 2 diabetes mellitus without complications: Secondary | ICD-10-CM | POA: Diagnosis not present

## 2019-12-27 DIAGNOSIS — R0989 Other specified symptoms and signs involving the circulatory and respiratory systems: Secondary | ICD-10-CM

## 2019-12-27 DIAGNOSIS — I10 Essential (primary) hypertension: Secondary | ICD-10-CM | POA: Diagnosis not present

## 2019-12-27 DIAGNOSIS — I8002 Phlebitis and thrombophlebitis of superficial vessels of left lower extremity: Secondary | ICD-10-CM | POA: Insufficient documentation

## 2019-12-27 HISTORY — DX: Phlebitis and thrombophlebitis of superficial vessels of left lower extremity: I80.02

## 2019-12-27 LAB — POCT GLYCOSYLATED HEMOGLOBIN (HGB A1C): Hemoglobin A1C: 7.2 % — AB (ref 4.0–5.6)

## 2019-12-27 LAB — D-DIMER, QUANTITATIVE: D-Dimer, Quant: 0.95 ug/mL-FEU — ABNORMAL HIGH (ref 0.00–0.50)

## 2019-12-27 LAB — GLUCOSE, CAPILLARY: Glucose-Capillary: 108 mg/dL — ABNORMAL HIGH (ref 70–99)

## 2019-12-27 MED ORDER — AMLODIPINE BESYLATE 5 MG PO TABS: 5.0000 mg | ORAL_TABLET | Freq: Every day | ORAL | 3 refills | Status: DC

## 2019-12-27 NOTE — Assessment & Plan Note (Signed)
Hypertension: Patient's BP today is 143/80 with a goal of <140/80. The patient endorses adherence to her medication regimen. She denied, chest pain, headache, visual changes, lightheadedness, weakness, dizziness on standing, swelling in the feet or ankles.   Plan: Continue olmesartan 40mg  daily Continue Amlodipine 5mg  daily Continue Carvedilol 3.125mg  BID Consider SGLT2-i

## 2019-12-27 NOTE — Addendum Note (Signed)
Addended by: Evelena Leyden on: 12/27/2019 05:03 PM   Modules accepted: Orders

## 2019-12-27 NOTE — Assessment & Plan Note (Addendum)
Left lower extremity edema: Swelling and pain of the left lower extremity.  No notable edema of the right lower extremity.  Pitting edema, pain in the calf. Wells score 2.   Plan: Will proceed with D-dimer today and if elevated order venous US.   Addendum: D-Dimer elevated. Will order Venous Ultrasound

## 2019-12-27 NOTE — Assessment & Plan Note (Signed)
DMII: Hgb A1c 7.0 % at the last visit. Current A1c 7.2%. The patient denied polyuria, polydipsia, headache, fatigue, confusion, nausea, vomiting or diaphoresis.   Plan: Continue Metformin 500mg  BID  Repeat A1c at next visit Consider SGLT2 in next visit given her heart failure

## 2019-12-27 NOTE — Addendum Note (Signed)
Addended by: Evelena Leyden on: 12/27/2019 05:06 PM   Modules accepted: Orders

## 2019-12-27 NOTE — Assessment & Plan Note (Signed)
HFrEF:  LVEF of 45-50% as of 07/2019. NYHA class I-II. No prior history of swelling, dyspnea or orthopnea.  Plan: Continue olmesartan, carvedilol

## 2019-12-27 NOTE — Patient Instructions (Signed)
FOLLOW-UP INSTRUCTIONS When: 3-4 months For: Routine evaluation for DMII, HTN and HFrEF What to bring: All of your medications  I have not made any changes to your medications today.   I have ordered a lab to evaluate for a blood clot that may be the cause of your left leg swelling. If the lab is elevated we will need to order an ultrasound to look for a clot. If not, I will call you and discuss other treatment options.   Thank you for your visit to the [facility] today. If you have any questions or concerns please call us at [phone].

## 2019-12-27 NOTE — Progress Notes (Signed)
Internal Medicine Clinic Attending  Case discussed with Dr. Harbrecht at the time of the visit.  We reviewed the resident's history and exam and pertinent patient test results.  I agree with the assessment, diagnosis, and plan of care documented in the resident's note.  Ivo Moga, M.D., Ph.D.  

## 2019-12-28 ENCOUNTER — Ambulatory Visit (HOSPITAL_COMMUNITY)
Admission: RE | Admit: 2019-12-28 | Discharge: 2019-12-28 | Disposition: A | Discharge status: Home / Self Care | Payer: Medicare Other | Source: Ambulatory Visit | Attending: Internal Medicine | Admitting: Internal Medicine

## 2019-12-28 ENCOUNTER — Other Ambulatory Visit: Payer: Self-pay

## 2019-12-28 DIAGNOSIS — R0989 Other specified symptoms and signs involving the circulatory and respiratory systems: Secondary | ICD-10-CM

## 2019-12-28 DIAGNOSIS — I8002 Phlebitis and thrombophlebitis of superficial vessels of left lower extremity: Secondary | ICD-10-CM | POA: Diagnosis not present

## 2019-12-28 LAB — BMP8+ANION GAP
Anion Gap: 17 mmol/L (ref 10.0–18.0)
BUN/Creatinine Ratio: 14 (ref 12–28)
BUN: 11 mg/dL (ref 8–27)
CO2: 23 mmol/L (ref 20–29)
Calcium: 10.5 mg/dL — ABNORMAL HIGH (ref 8.7–10.3)
Chloride: 99 mmol/L (ref 96–106)
Creatinine, Ser: 0.8 mg/dL (ref 0.57–1.00)
GFR calc Af Amer: 84 mL/min/{1.73_m2} (ref >59)
GFR calc non Af Amer: 73 mL/min/{1.73_m2} (ref >59)
Glucose: 100 mg/dL — ABNORMAL HIGH (ref 65–99)
Potassium: 3.9 mmol/L (ref 3.5–5.2)
Sodium: 139 mmol/L (ref 134–144)

## 2019-12-28 NOTE — Assessment & Plan Note (Signed)
Called to notify the patient regarding the results of the Venous Ultrasound demonstrating likely superficial thrombophlebitis but no DVT noted. I advised the patient to keep her leg raised while sitting, walk 15-38min daily, apply warm compresses as needed. She stated understanding and is in agreement with this plan. She will call back if any new concerns develop.

## 2019-12-28 NOTE — Progress Notes (Signed)
VASCULAR LAB PRELIMINARY  PRELIMINARY  PRELIMINARY  PRELIMINARY  Left lower extremity venous duplex completed.    Preliminary report:  See CV proc for preliminary results.  Paged [phone], no return call received.  Lani Mendiola, RVT 12/28/2019, 10:20 AM

## 2020-03-11 ENCOUNTER — Other Ambulatory Visit: Payer: Self-pay | Admitting: Internal Medicine

## 2020-03-11 DIAGNOSIS — I10 Essential (primary) hypertension: Secondary | ICD-10-CM

## 2020-03-18 ENCOUNTER — Encounter: Payer: Self-pay | Admitting: *Deleted

## 2020-03-27 ENCOUNTER — Encounter: Payer: Self-pay | Admitting: Internal Medicine

## 2020-03-27 ENCOUNTER — Ambulatory Visit (INDEPENDENT_AMBULATORY_CARE_PROVIDER_SITE_OTHER): Payer: Medicare Other | Admitting: Internal Medicine

## 2020-03-27 VITALS — BP 171/86 | HR 87 | Temp 98.9°F | Ht 64.0 in | Wt 185.1 lb

## 2020-03-27 DIAGNOSIS — I10 Essential (primary) hypertension: Secondary | ICD-10-CM | POA: Diagnosis not present

## 2020-03-27 DIAGNOSIS — E119 Type 2 diabetes mellitus without complications: Secondary | ICD-10-CM

## 2020-03-27 DIAGNOSIS — I110 Hypertensive heart disease with heart failure: Secondary | ICD-10-CM

## 2020-03-27 DIAGNOSIS — Z1211 Encounter for screening for malignant neoplasm of colon: Secondary | ICD-10-CM

## 2020-03-27 DIAGNOSIS — I5020 Unspecified systolic (congestive) heart failure: Secondary | ICD-10-CM

## 2020-03-27 DIAGNOSIS — Z7984 Long term (current) use of oral hypoglycemic drugs: Secondary | ICD-10-CM | POA: Diagnosis not present

## 2020-03-27 DIAGNOSIS — I509 Heart failure, unspecified: Secondary | ICD-10-CM | POA: Diagnosis not present

## 2020-03-27 LAB — POCT GLYCOSYLATED HEMOGLOBIN (HGB A1C): Hemoglobin A1C: 7 % — AB (ref 4.0–5.6)

## 2020-03-27 LAB — GLUCOSE, CAPILLARY: Glucose-Capillary: 139 mg/dL — ABNORMAL HIGH (ref 70–99)

## 2020-03-27 MED ORDER — AMLODIPINE BESYLATE 5 MG PO TABS: 10.0000 mg | ORAL_TABLET | Freq: Every day | ORAL | 3 refills | Status: DC

## 2020-03-27 MED ORDER — EMPAGLIFLOZIN-METFORMIN HCL 5-500 MG PO TABS: ORAL_TABLET | ORAL | 5 refills | Status: DC

## 2020-03-27 MED ORDER — CARVEDILOL 6.25 MG PO TABS: 6.2500 mg | ORAL_TABLET | Freq: Two times a day (BID) | ORAL | 1 refills | Status: DC

## 2020-03-27 MED ORDER — AMLODIPINE BESYLATE 5 MG PO TABS: 5.0000 mg | ORAL_TABLET | Freq: Every day | ORAL | 3 refills | Status: DC

## 2020-03-27 NOTE — Progress Notes (Signed)
Error in system 

## 2020-03-27 NOTE — Assessment & Plan Note (Signed)
Health Maintenance History: Colon cancer screening ordered.

## 2020-03-27 NOTE — Assessment & Plan Note (Signed)
  HFrEF: LVEF of 45-50% as of 07/2019. (HF in this range should be treated more like HFpEF than HFrEF with close monitoring for worsening function) NYHA class II. Discussed SGLT2-I at nauseum. Patient does not wish to start this.  Plan: Continue ARB/BB as well as life style modifications  Patient agreed to uptitrate her carvedilol today to 6.25mg  BID Consider addition spironolactone at her next visit, Potassium is <4.

## 2020-03-27 NOTE — Progress Notes (Signed)
   CC: Leg pain and HTN  HPI:[name] is a 74 y.o. female who presents for evaluation of HTN. Please see individual problem based A/P for details.  Past Medical History:  Diagnosis Date  . Anemia    -NOS- Positive stool guaiac cards, refuses colonoscopy  . Dizziness 02/23/2018  . Exposure to second hand smoke   . Hypercalcemia    NI SPEP, UPEP, PTH  . Hyperlipidemia   . Hypertension   . Hypokalemia    2nd diuretics  . Proteinuria   . Shoulder pain    Work Related   Review of Systems:  ROS negative except as per HPI.  Physical Exam: Vitals:   03/27/20 1322  BP: (!) 171/86  Pulse: 87  Temp: 98.9 F (37.2 C)  TempSrc: Oral  SpO2: 100%  Weight: 185 lb 1.6 oz (84 kg)  Height: 5\' 4"  (1.626 m)   Filed Weights   03/27/20 1322  Weight: 185 lb 1.6 oz (84 kg)   General: A/O x4, in no acute distress, afebrile, nondiaphoretic HEENT: PEERL, EMO intact Cardio: RRR, no mrg's  Pulmonary: CTA bilaterally,  no wheezing or crackles  Abdomen: Bowel sounds normal, soft, nontender  MSK: BLE nontender, nonedematous Neuro: Alert, CNII-XII grossly intact, conversational, strength 5/5 in the upper and lower extremities bilaterally, normal gait Psych: Appropriate affect, not depressed in appearance, engages well  Assessment & Plan:   See Encounters Tab for problem based charting.  Patient discussed with Dr. 05/27/20

## 2020-03-27 NOTE — Patient Instructions (Addendum)
FOLLOW-UP INSTRUCTIONS When: 3-4 months For: Routine visit What to bring: All of your medications  I have increased your carvedilol to 6.25mg  two times daily, you can take two of the 3.125mg  tablets two times daily until you pick up the new dose.  Please continue to take all of the other medications as prescribed.  Today we discussed your high blood pressure, history of heart failure and diabetes. We discussed adding some additional medications but will hold off on this for now per your request. I will notify you of the results of any labs from today's evaluation when available to me.   Thank you for your visit to the [facility] today. If you have any questions or concerns please call us at [phone].

## 2020-03-27 NOTE — Assessment & Plan Note (Signed)
DMII: Hgb A1c 7.2 % at the last visit. Current A1c 7.0%. The patient denied polyuria, polydipsia, headache, fatigue, confusion, nausea, vomiting or diaphoresis. We Discussed the benefits of an SGLT2-I at nauseum to which she did not agree. I advised to reconsider.  Plan:  Continue Metformin 500mg  BID  Repeat A1c at next visit Consider SGLT2 in next visit given her heart failure

## 2020-03-27 NOTE — Assessment & Plan Note (Signed)
Hypertension: Patient's BP today is 171/86 with a goal of <140/80. The patient endorses adherence to her medication regimen. She denied, chest pain, headache, visual changes, lightheadedness, weakness, dizziness on standing, swelling in the feet or ankles.  Patient does not wish to start this.Additionally, she did not wish to increase her dose of Amlodipine nor add an additional medication. She will consider dietary modifications such as using less salt (Na) on her food as well as exercising.   Plan: Continue olmesartan 40mg  daily Continue Amlodipine increased to 10mg  daily Continue Carvedilol increased to 6.25mg  BID Continue to discuss increasing antihypertensives  Consider addition spironolactone at her next visit, Potassium is <4.

## 2020-03-28 LAB — BMP8+ANION GAP
Anion Gap: 16 mmol/L (ref 10.0–18.0)
BUN/Creatinine Ratio: 13 (ref 12–28)
BUN: 12 mg/dL (ref 8–27)
CO2: 25 mmol/L (ref 20–29)
Calcium: 10.6 mg/dL — ABNORMAL HIGH (ref 8.7–10.3)
Chloride: 95 mmol/L — ABNORMAL LOW (ref 96–106)
Creatinine, Ser: 0.9 mg/dL (ref 0.57–1.00)
GFR calc Af Amer: 73 mL/min/{1.73_m2} (ref >59)
GFR calc non Af Amer: 63 mL/min/{1.73_m2} (ref >59)
Glucose: 147 mg/dL — ABNORMAL HIGH (ref 65–99)
Potassium: 4.4 mmol/L (ref 3.5–5.2)
Sodium: 136 mmol/L (ref 134–144)

## 2020-04-02 NOTE — Progress Notes (Signed)
Internal Medicine Clinic Attending  Case discussed with Dr. Harbrecht at the time of the visit.  We reviewed the resident's history and exam and pertinent patient test results.  I agree with the assessment, diagnosis, and plan of care documented in the resident's note.   

## 2020-04-03 ENCOUNTER — Encounter: Payer: Medicare Other | Admitting: Internal Medicine

## 2020-04-03 DIAGNOSIS — Z1211 Encounter for screening for malignant neoplasm of colon: Secondary | ICD-10-CM | POA: Diagnosis not present

## 2020-04-09 ENCOUNTER — Other Ambulatory Visit: Payer: Self-pay

## 2020-04-09 ENCOUNTER — Other Ambulatory Visit: Payer: Medicare Other

## 2020-04-09 DIAGNOSIS — Z1211 Encounter for screening for malignant neoplasm of colon: Secondary | ICD-10-CM

## 2020-04-10 LAB — FECAL OCCULT BLOOD, IMMUNOCHEMICAL: Fecal Occult Bld: NEGATIVE

## 2020-05-16 ENCOUNTER — Other Ambulatory Visit: Payer: Self-pay | Admitting: Internal Medicine

## 2020-05-16 DIAGNOSIS — E119 Type 2 diabetes mellitus without complications: Secondary | ICD-10-CM

## 2020-06-28 DIAGNOSIS — Z23 Encounter for immunization: Secondary | ICD-10-CM | POA: Diagnosis not present

## 2020-07-19 DIAGNOSIS — Z23 Encounter for immunization: Secondary | ICD-10-CM | POA: Diagnosis not present

## 2020-07-23 ENCOUNTER — Other Ambulatory Visit: Payer: Self-pay | Admitting: *Deleted

## 2020-07-23 DIAGNOSIS — E7800 Pure hypercholesterolemia, unspecified: Secondary | ICD-10-CM

## 2020-07-23 MED ORDER — PRAVASTATIN SODIUM 40 MG PO TABS: 40.0000 mg | ORAL_TABLET | Freq: Every day | ORAL | 3 refills | Status: DC

## 2020-07-23 NOTE — Telephone Encounter (Signed)
Requesting refill on Pravastatin called into [facility].

## 2020-08-12 NOTE — Progress Notes (Signed)
   CC: follow-up on HTN  HPI:  [name] is a 74 y.o. person with history of HTN, DM2, HFrEF who presents to clinic for follow-up of her chronic medical conditions. Her last clinic visit was on 03/27/20 with her then-PCP, Dr. Crista Elliot.   To see the details of this patient's management of their acute and chronic problems, please refer to the Assessment & Plan under the Encounters tab.    Past Medical History:  Diagnosis Date  . Anemia    -NOS- Positive stool guaiac cards, refuses colonoscopy  . Dizziness 02/23/2018  . Exposure to second hand smoke   . Hypercalcemia    NI SPEP, UPEP, PTH  . Hyperlipidemia   . Hypertension   . Hypokalemia    2nd diuretics  . Proteinuria   . Shoulder pain    Work Related   Review of Systems:    Review of Systems  Constitutional: Negative for chills and fever.  Respiratory: Negative for cough.   Cardiovascular: Negative for chest pain.  Gastrointestinal: Negative for abdominal pain, nausea and vomiting.  Genitourinary: Negative for dysuria.  Musculoskeletal: Negative for myalgias.  Neurological: Negative for dizziness and weakness.   Physical Exam:  Vitals:   08/13/20 0951  BP: (!) 152/83  Pulse: 87  Temp: 98.4 F (36.9 C)  TempSrc: Oral  SpO2: 100%  Weight: 185 lb 11.2 oz (84.2 kg)   Constitutional: very pleasant, well-appearing woman sitting in chair, in no acute distress HENT: normocephalic atraumatic, mucous membranes moist Eyes: conjunctiva non-erythematous Neck: supple Cardiovascular: regular rate and rhythm, no m/r/g, trace pitting edema of bilateral ankles Pulmonary/Chest: normal work of breathing on room air, lungs clear to auscultation bilaterally Abdominal: soft, non-tender, non-distended MSK: normal bulk and tone Neurological: alert & oriented x 3, slightly antalgic gait  Assessment & Plan:   See Encounters Tab for problem based charting.  Patient seen with Dr. Mayford Knife

## 2020-08-13 ENCOUNTER — Ambulatory Visit (INDEPENDENT_AMBULATORY_CARE_PROVIDER_SITE_OTHER): Payer: Medicare Other | Admitting: Student

## 2020-08-13 ENCOUNTER — Encounter: Payer: Self-pay | Admitting: Student

## 2020-08-13 ENCOUNTER — Other Ambulatory Visit: Payer: Self-pay

## 2020-08-13 VITALS — BP 152/83 | HR 87 | Temp 98.4°F | Wt 185.7 lb

## 2020-08-13 DIAGNOSIS — Z0000 Encounter for general adult medical examination without abnormal findings: Secondary | ICD-10-CM | POA: Insufficient documentation

## 2020-08-13 DIAGNOSIS — Z0001 Encounter for general adult medical examination with abnormal findings: Secondary | ICD-10-CM

## 2020-08-13 DIAGNOSIS — I10 Essential (primary) hypertension: Secondary | ICD-10-CM

## 2020-08-13 DIAGNOSIS — I5020 Unspecified systolic (congestive) heart failure: Secondary | ICD-10-CM

## 2020-08-13 DIAGNOSIS — E119 Type 2 diabetes mellitus without complications: Secondary | ICD-10-CM | POA: Diagnosis not present

## 2020-08-13 DIAGNOSIS — I5022 Chronic systolic (congestive) heart failure: Secondary | ICD-10-CM | POA: Diagnosis not present

## 2020-08-13 DIAGNOSIS — Z1211 Encounter for screening for malignant neoplasm of colon: Secondary | ICD-10-CM | POA: Diagnosis not present

## 2020-08-13 DIAGNOSIS — I110 Hypertensive heart disease with heart failure: Secondary | ICD-10-CM | POA: Diagnosis not present

## 2020-08-13 LAB — GLUCOSE, CAPILLARY: Glucose-Capillary: 198 mg/dL — ABNORMAL HIGH (ref 70–99)

## 2020-08-13 LAB — POCT GLYCOSYLATED HEMOGLOBIN (HGB A1C): Hemoglobin A1C: 7.1 % — AB (ref 4.0–5.6)

## 2020-08-13 MED ORDER — CARVEDILOL 6.25 MG PO TABS: 6.2500 mg | ORAL_TABLET | Freq: Two times a day (BID) | ORAL | 1 refills | Status: DC

## 2020-08-13 MED ORDER — OLMESARTAN MEDOXOMIL 40 MG PO TABS: 40.0000 mg | ORAL_TABLET | Freq: Every day | ORAL | 3 refills | Status: DC

## 2020-08-13 MED ORDER — AMLODIPINE BESYLATE 5 MG PO TABS: 10.0000 mg | ORAL_TABLET | Freq: Every day | ORAL | 0 refills | Status: DC

## 2020-08-13 NOTE — Assessment & Plan Note (Signed)
Fit testing negative on 04/03/20.

## 2020-08-13 NOTE — Assessment & Plan Note (Signed)
LVEF of 45-50% as of 07/2019.   Patient very resistant to increasing her pill burden. As we are increasing her amlodipine this visit, will not push addition of spironolactone at this visit. She is excited to report her adherence to a low-sodium diet and increase in physical activity.  Plan: - Continue ARB/BB (olmesartan 40 mg daily & carvedilol 6.25 mg twice daily) as well as lifestyle modifications - Consider addition of spironolactone at next visit

## 2020-08-13 NOTE — Assessment & Plan Note (Signed)
Hgb A1c stable at 7.1% today. Patient denies polyuria, polydipsia, or symptoms of hypoglycemia.  Plan: - continue metformin 500 mg twice daily - repeat A1c in 3 months (~11/12/20) - consider SGLT2i at next visit given heart failure

## 2020-08-13 NOTE — Assessment & Plan Note (Signed)
-   Flu shot: Patient declines flu shot now or ever - COVID-19 vaccination: received 2nd Pfizer dose on 07/19/20 - Pneumococcal vaccination: Counseled patient on PCV13. She would like to think about this. Readdress at next visit. - Colon cancer screening: Fit testing negative on 04/03/20 - Breast cancer screening: Patient notes that she needs to follow-up with [facility]. Number provided: [phone] - Cervical cancer screening: Patient is >65. Need to confirm that she has had adequate screening previously. Address at next visit.

## 2020-08-13 NOTE — Assessment & Plan Note (Signed)
BP 152/83 today with a goal of <140/80. She states she has been taking olmesartan 40 mg daily, amlodipine 5 mg daily, and carvedilol 6.25mg  twice daily. She did not realize that she had been advised to increase her amlodipine to 10 mg daily. She denies headache, chest pain, lightheadedness, weakness, dizziness. She reports she has been working hard on her low-sodium diet, and notes that she has been trying to be more active lately, such as walking with her sister.  A/P: The patient is very hesitant to add medications to her regimen, stating, "I already take too many pills." She seems invested in her health as evidenced by her excitement about sharing her dietary and physical activity changes.  - Continue olmesartan 40 mg daily - Increase amlodipine to 10 mg daily - continue carvedilol 6.25 mg twice daily - If she is still not controlled after increase in amlodipine, could consider addition of spironolactone at her next visit. - Repeat BMP at follow-up in 3 months

## 2020-08-13 NOTE — Patient Instructions (Addendum)
Sarah Branch,   Thank you for your visit to the [facility] today. It was a pleasure meeting you! Today we discussed the following:  1) Hypertension - Your blood pressure was 152/83 today - I would like you to increase your amlodipine to 10 mg daily (2 pills daily) - If you are able, recommend you check your BP once daily in the morning (see below for instructions) and record these values so we can review at your next appointment - Continue taking your other medications as prescribed   HOW TO TAKE YOUR BLOOD PRESSURE:  Rest 5 minutes before taking your blood pressure.  Don't smoke or drink caffeinated beverages for at least 30 minutes before.  Take your blood pressure before (not after) you eat.  Sit comfortably with your back supported and both feet on the floor (don't cross your legs).  Elevate your arm to heart level on a table or a desk.  Use the proper sized cuff. It should fit smoothly and snugly around your bare upper arm. There should be enough room to slip a fingertip under the cuff. The bottom edge of the cuff should be 1 inch above the crease of the elbow.  Ideally, take 3 measurements at one sitting and record the average.  2) Diabetes - Your A1c was 7.1% today.  - Continue taking your metformin as prescribed.  3) Heart failure - Keep taking all your medicines as prescribed  4) Medication refills - I refilled your carvedilol and olmesartan  5) Pneumonia vaccine: below is some information regarding the pneumonia vaccine we discussed. If you are willing, we can plan on giving you this vaccine at your next appointment.  6) Mammogram: Give [facility] a call at [phone] to arrange for your next mammogram  Keep up the great work with your low-salt diet!  I would like to see you back in ~3 months (December 2021) for follow-up on diabetes, hypertension, and heart failure. Please bring all of your medications with  you.   If you have any questions or concerns, please call our clinic at [phone] between 9am-5pm. Outside of these hours, call [phone] and ask for the internal medicine resident on call. If you feel you are having a medical emergency please call 911.    Pneumococcal Conjugate Vaccine (PCV13): What You Need to Know 1. Why get vaccinated? Pneumococcal conjugate vaccine (PCV13) can prevent pneumococcal disease. Pneumococcal disease refers to any illness caused by pneumococcal bacteria. These bacteria can cause many types of illnesses, including pneumonia, which is an infection of the lungs. Pneumococcal bacteria are one of the most common causes of pneumonia. Besides pneumonia, pneumococcal bacteria can also cause:  Ear infections  Sinus infections  Meningitis (infection of the tissue covering the brain and spinal cord)  Bacteremia (bloodstream infection) Anyone can get pneumococcal disease, but children under 79 years of age, people with certain medical conditions, adults 65 years or older, and cigarette smokers are at the highest risk. Most pneumococcal infections are mild. However, some can result in long-term problems, such as brain damage or hearing loss. Meningitis, bacteremia, and pneumonia caused by pneumococcal disease can be fatal. 2. PCV13 PCV13 protects against 13 types of bacteria that cause pneumococcal disease. Infants and young children usually need 4 doses of pneumococcal conjugate vaccine, at 2, 4, 6, and 63-57 months of age. In some cases, a child might need fewer than 4 doses to complete PCV13 vaccination. A dose of PCV23 vaccine is  also recommended for anyone 2 years or older with certain medical conditions if they did not already receive PCV13. This vaccine may be given to adults 65 years or older based on discussions between the patient and health care provider. 3. Talk with your health care provider Tell your vaccine provider if the person getting the  vaccine:  Has had an allergic reaction after a previous dose of PCV13, to an earlier pneumococcal conjugate vaccine known as PCV7, or to any vaccine containing diphtheria toxoid (for example, DTaP), or has any severe, life-threatening allergies.  In some cases, your health care provider may decide to postpone PCV13 vaccination to a future visit. People with minor illnesses, such as a cold, may be vaccinated. People who are moderately or severely ill should usually wait until they recover before getting PCV13. Your health care provider can give you more information. 4. Risks of a vaccine reaction  Redness, swelling, pain, or tenderness where the shot is given, and fever, loss of appetite, fussiness (irritability), feeling tired, headache, and chills can happen after PCV13. Young children may be at increased risk for seizures caused by fever after PCV13 if it is administered at the same time as inactivated influenza vaccine. Ask your health care provider for more information. People sometimes faint after medical procedures, including vaccination. Tell your provider if you feel dizzy or have vision changes or ringing in the ears. As with any medicine, there is a very remote chance of a vaccine causing a severe allergic reaction, other serious injury, or death. 5. What if there is a serious problem? An allergic reaction could occur after the vaccinated person leaves the clinic. If you see signs of a severe allergic reaction (hives, swelling of the face and throat, difficulty breathing, a fast heartbeat, dizziness, or weakness), call 9-1-1 and get the person to the nearest hospital. For other signs that concern you, call your health care provider. Adverse reactions should be reported to the Vaccine Adverse Event Reporting System (VAERS). Your health care provider will usually file this report, or you can do it yourself. Visit the VAERS website at www.vaers.LAgents.no or call (365)078-9069. VAERS is only for  reporting reactions, and VAERS staff do not give medical advice. 6. The National Vaccine Injury Compensation Program The [employer] Vaccine Injury Compensation Program (VICP) is a federal program that was created to compensate people who may have been injured by certain vaccines. Visit the VICP website at SpiritualWord.at or call 424-826-0679 to learn about the program and about filing a claim. There is a time limit to file a claim for compensation. 7. How can I learn more?  Ask your health care provider.  Call your local or state health department.  Contact the Centers for Disease Control and Prevention (CDC): ? Call 507-543-3904 (1-800-CDC-INFO) or ? Visit CDC's website at PicCapture.uy Vaccine Information Statement PCV13 Vaccine (09/14/2018) This information is not intended to replace advice given to you by your health care provider. Make sure you discuss any questions you have with your health care provider. Document Revised: 02/21/2019 Document Reviewed: 06/14/2018 Elsevier Patient Education  2020 [employer].

## 2020-08-14 NOTE — Progress Notes (Signed)
DOS 08/13/20:  Internal Medicine Clinic Attending  I saw and evaluated the patient.  I personally confirmed the key portions of the history and exam documented by Dr. Watson and I reviewed pertinent patient test results.  The assessment, diagnosis, and plan were formulated together and I agree with the documentation in the resident's note.  

## 2020-08-19 ENCOUNTER — Other Ambulatory Visit: Payer: Self-pay | Admitting: Student

## 2020-08-19 DIAGNOSIS — N6320 Unspecified lump in the left breast, unspecified quadrant: Secondary | ICD-10-CM

## 2020-09-05 ENCOUNTER — Ambulatory Visit: Payer: Medicare Other

## 2020-09-05 ENCOUNTER — Ambulatory Visit
Admission: RE | Admit: 2020-09-05 | Discharge: 2020-09-05 | Disposition: A | Discharge status: Home / Self Care | Payer: Medicare Other | Source: Ambulatory Visit | Attending: Internal Medicine | Admitting: Internal Medicine

## 2020-09-05 ENCOUNTER — Other Ambulatory Visit: Payer: Self-pay

## 2020-09-05 DIAGNOSIS — R928 Other abnormal and inconclusive findings on diagnostic imaging of breast: Secondary | ICD-10-CM | POA: Diagnosis not present

## 2020-09-05 DIAGNOSIS — N6320 Unspecified lump in the left breast, unspecified quadrant: Secondary | ICD-10-CM

## 2020-09-07 IMAGING — DX DG CHEST 1V PORT
1 series · 1 of 1 positions shown · non-contrast
Comparison: 03/21/2015

CLINICAL DATA: Pt complains of SOB. Hx of HTN and diabetes.

EXAM:
PORTABLE CHEST 1 VIEW

[chest ap]
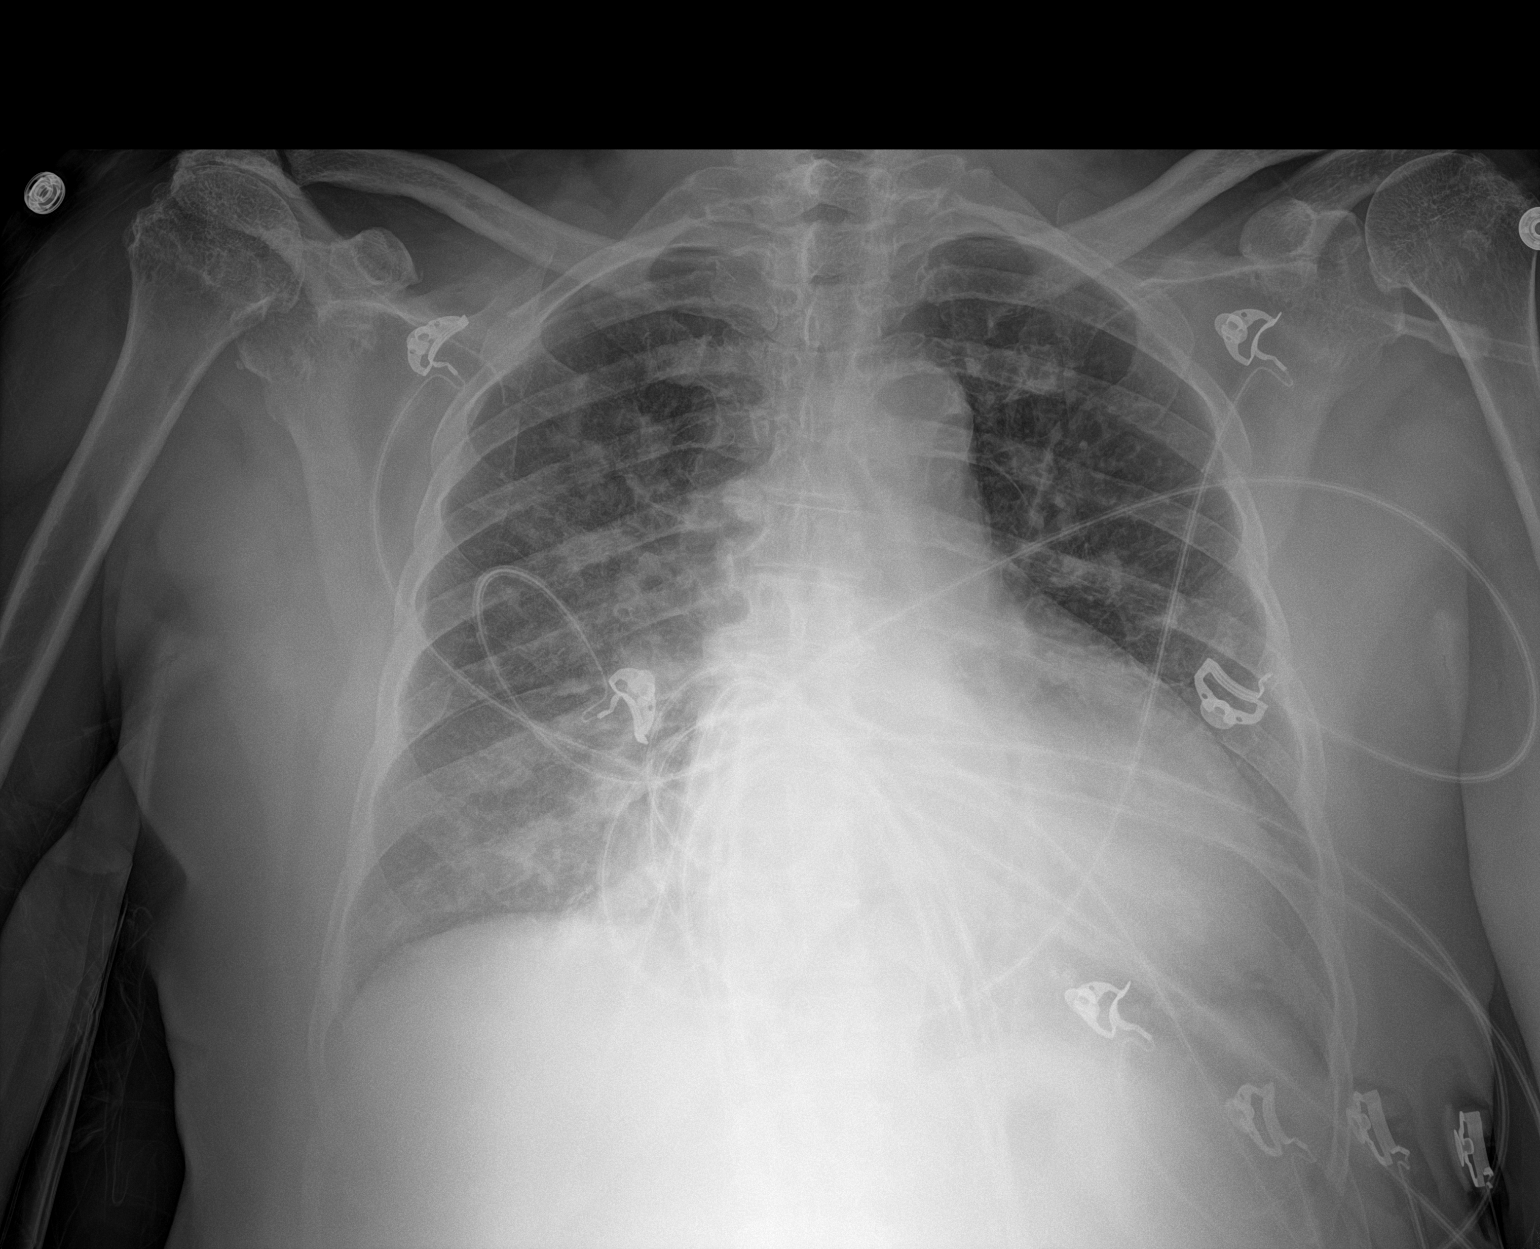

[1 of 1 positions shown; findings below may reference images not displayed]

FINDINGS: Heart is enlarged and accentuated by portable technique. There are
hazy patchy opacities in the lungs bilaterally without confluent
opacity. No pleural effusions. Chronic changes in both shoulders.
IMPRESSION: Bilateral patchy opacities in the lungs, favoring infectious or
inflammatory process over pulmonary edema.

## 2020-10-24 ENCOUNTER — Other Ambulatory Visit: Payer: Self-pay | Admitting: Student

## 2020-10-24 ENCOUNTER — Encounter: Payer: Self-pay | Admitting: *Deleted

## 2020-10-24 DIAGNOSIS — H26492 Other secondary cataract, left eye: Secondary | ICD-10-CM | POA: Diagnosis not present

## 2020-10-24 DIAGNOSIS — E119 Type 2 diabetes mellitus without complications: Secondary | ICD-10-CM

## 2020-10-24 DIAGNOSIS — Z961 Presence of intraocular lens: Secondary | ICD-10-CM | POA: Diagnosis not present

## 2020-10-24 LAB — HM DIABETES EYE EXAM

## 2020-10-24 NOTE — Telephone Encounter (Signed)
Next appt scheduled 12/22 with PCP.

## 2020-10-29 ENCOUNTER — Encounter: Payer: Self-pay | Admitting: *Deleted

## 2020-10-29 NOTE — Progress Notes (Signed)

## 2020-10-30 NOTE — Progress Notes (Signed)
COMPLETE

## 2020-10-30 NOTE — Progress Notes (Signed)
Things That May Be Affecting Your Health:  Alcohol  Hearing loss  Pain    Depression  Home Safety  Sexual Health  x Diabetes  Lack of physical activity  Stress   Difficulty with daily activities  Loneliness  Tiredness   Drug use x Medicines  Tobacco use   Falls  Motor Vehicle Safety  Weight  x Food choices  Oral Health  Other    YOUR PERSONALIZED HEALTH PLAN : 1. Schedule your next subsequent Medicare Wellness visit in one year 2. Attend all of your regular appointments to address your medical issues 3. Complete the preventative screenings and services   Annual Wellness Visit   Medicare Covered Preventative Screenings and Services  Services & Screenings Men and Women Who How Often Need? Date of Last Service Action  Abdominal Aortic Aneurysm Adults with AAA risk factors Once     Alcohol Misuse and Counseling All Adults Screening once a year if no alcohol misuse. Counseling up to 4 face to face sessions.     Bone Density Measurement  Adults at risk for osteoporosis Once every 2 yrs     Lipid Panel Z13.6 All adults without CV disease Once every 5 yrs     Colorectal Cancer   Stool sample or  Colonoscopy All adults 50 and older   Once every year  Every 10 years     Depression All Adults Once a year  Today   Diabetes Screening Blood glucose, post glucose load, or GTT Z13.1  All adults at risk  Pre-diabetics  Once per year  Twice per year     Diabetes  Self-Management Training All adults Diabetics 10 hrs first year; 2 hours subsequent years. Requires Copay     Glaucoma  Diabetics  Family history of glaucoma  African Americans 50 yrs +  Hispanic Americans 65 yrs + Annually - requires coppay     Hepatitis C Z72.89 or F19.20  High Risk for HCV  Born between 1945 and 1965  Annually  Once     HIV Z11.4 All adults based on risk  Annually btw ages 36 & 23 regardless of risk  Annually > 65 yrs if at increased risk     Lung Cancer Screening Asymptomatic adults aged  81-77 with 30 pack yr history and current smoker OR quit within the last 15 yrs Annually Must have counseling and shared decision making documentation before first screen     Medical Nutrition Therapy Adults with   Diabetes  Renal disease  Kidney transplant within past 3 yrs 3 hours first year; 2 hours subsequent years x    Obesity and Counseling All adults Screening once a year Counseling if BMI 30 or higher  Today   Tobacco Use Counseling Adults who use tobacco  Up to 8 visits in one year     Vaccines Z23  Hepatitis B  Influenza   Pneumonia  Adults   Once  Once every flu season  Two different vaccines separated by one year     Next Annual Wellness Visit People with Medicare Every year  Today     Services & Screenings Women Who How Often Need  Date of Last Service Action  Mammogram  Z12.31 Women over 40 One baseline ages 63-39. Annually ager 40 yrs+     Pap tests All women Annually if high risk. Every 2 yrs for normal risk women     Screening for cervical cancer with   Pap (Z01.419 nl or Z01.411abnl) &  HPV Z11.51 Women aged 51 to 43 Once every 5 yrs     Screening pelvic and breast exams All women Annually if high risk. Every 2 yrs for normal risk women     Sexually Transmitted Diseases  Chlamydia  Gonorrhea  Syphilis All at risk adults Annually for non pregnant females at increased risk         [place] Men Who How Ofter Need  Date of Last Service Action  Prostate Cancer - DRE & PSA Men over 50 Annually.  DRE might require a copay.     Sexually Transmitted Diseases  Syphilis All at risk adults Annually for men at increased risk

## 2020-11-06 ENCOUNTER — Encounter: Payer: Self-pay | Admitting: Student

## 2020-11-06 ENCOUNTER — Other Ambulatory Visit: Payer: Self-pay

## 2020-11-06 ENCOUNTER — Ambulatory Visit (INDEPENDENT_AMBULATORY_CARE_PROVIDER_SITE_OTHER): Payer: Medicare Other | Admitting: Student

## 2020-11-06 VITALS — BP 125/81 | HR 82 | Temp 98.7°F | Ht 64.0 in | Wt 187.9 lb

## 2020-11-06 DIAGNOSIS — Z0000 Encounter for general adult medical examination without abnormal findings: Secondary | ICD-10-CM

## 2020-11-06 DIAGNOSIS — I5020 Unspecified systolic (congestive) heart failure: Secondary | ICD-10-CM

## 2020-11-06 DIAGNOSIS — I110 Hypertensive heart disease with heart failure: Secondary | ICD-10-CM | POA: Diagnosis not present

## 2020-11-06 DIAGNOSIS — I10 Essential (primary) hypertension: Secondary | ICD-10-CM | POA: Diagnosis not present

## 2020-11-06 DIAGNOSIS — E119 Type 2 diabetes mellitus without complications: Secondary | ICD-10-CM

## 2020-11-06 DIAGNOSIS — F32A Depression, unspecified: Secondary | ICD-10-CM | POA: Diagnosis not present

## 2020-11-06 DIAGNOSIS — R4589 Other symptoms and signs involving emotional state: Secondary | ICD-10-CM | POA: Insufficient documentation

## 2020-11-06 LAB — POCT GLYCOSYLATED HEMOGLOBIN (HGB A1C): Hemoglobin A1C: 7.3 % — AB (ref 4.0–5.6)

## 2020-11-06 LAB — GLUCOSE, CAPILLARY: Glucose-Capillary: 158 mg/dL — ABNORMAL HIGH (ref 70–99)

## 2020-11-06 NOTE — Assessment & Plan Note (Addendum)
Hgb A1c stable at 7.3% today (from 7.1% 3 months ago). She denies polyuria, polydipsia, or symptoms of hypoglycemia.  Patient had diabetic eye exam on 10/24/20. No evidence of diabetic retinopathy.   Plan: - continue metformin 500 mg twice daily - repeat A1c in 3 months (~02/04/21) - repeat eye exam in 1 year (~10/24/21)

## 2020-11-06 NOTE — Assessment & Plan Note (Signed)
BP 125/81 on repeat after initial 157/78. Patient reports adherence to olmesartan 40 mg daily, amlodipine 10 mg daily (increased from 5 mg at last appointment), and carvedilol 6.25 mg twice daily.  She denies headache, chest pain, lightheadedness, weakness, or dizziness. Reports she continues to minimize her salt intake. Notes her sister continues to encourage her physical activity.  Assessment/Plan: BP at goal after increasing amlodipine at last visit.  - Continue  olmesartan 40 mg daily Amlodipine 10 mg daily Carvedilol 6.25 mg twice daily - Repeat BMP today

## 2020-11-06 NOTE — Patient Instructions (Addendum)
Ms. Legere,   Thank you for your visit to the [facility] today. It was a pleasure seeing you. Today we discussed the following:  1) Hypertension, heart failure: - Continue all of your current medications except you can STOP taking aspirin 81 mg daily - Continue your low-salt diet  2) Diabetes: Your A1c was stable at 7.3%. Great work! - Continue your metformin - Continue to eat healthfully and stay active  3) Mood: the holidays can be a tough time for missing loved ones. I'm glad you have your sister as support. We are here if you need to talk as well.   We would like to see you back in 3 months for diabetes and hypertension follow-up. Please bring all of your medications with you.   If you have any questions or concerns, please call our clinic at [phone] between 9am-5pm. Outside of these hours, call [phone] and ask for the internal medicine resident on call. If you feel you are having a medical emergency please call 911.

## 2020-11-06 NOTE — Assessment & Plan Note (Signed)
-   Flu shot: Patient declines flu shot now or ever - COVID-19 vaccination: received 2nd Pfizer dose on 07/19/20; she will be eligible for booster after 01/16/21 - Pneumococcal vaccination: Counseled patient on PCV13. She declines at this time. - Colon cancer screening: Fit testing negative on 04/03/20 - Breast cancer screening: Mammogram completed 09/05/20, BIRADS 1, negative. Repeat referral after 09/05/21. - Cervical cancer screening: Patient is >65. Need to confirm that she has had adequate screening previously. Address at next visit.

## 2020-11-06 NOTE — Assessment & Plan Note (Addendum)
LVEF 45-50% as of 07/2019 corresponds to heart failure with mildly reduced ejection fraction (HFmrEF). NYHA class II symptoms.  Patient reports her functional status has been stable, gets help with iADLs from her sister but otherwise lives independently and takes care of her ADLs. Denies increased shortness of breath at rest or with exertion, chest pain, palpitations, lower extremity swelling.  Plan: - Continue ARB and BB (olmesartan 40 mg daily and carvedilol 6.25 mg twice daily) - Continue adherence to low-sodium diet - BMP today

## 2020-11-06 NOTE — Progress Notes (Signed)
   CC: Diabetes and HTN follow-up  HPI:  [name] is a 74 y.o. woman with history as below who presents to clinic for follow-up of her chronic medical problems. Her last clinic visit was on 08/13/20.   To see the details of this patient's management of their acute and chronic problems, please refer to the Assessment & Plan under the Encounters tab.    Past Medical History:  Diagnosis Date  . Anemia    -NOS- Positive stool guaiac cards, refuses colonoscopy  . Dizziness 02/23/2018  . Exposure to second hand smoke   . Hypercalcemia    NI SPEP, UPEP, PTH  . Hyperlipidemia   . Hypertension   . Hypokalemia    2nd diuretics  . Proteinuria   . Severe hypertension 07/22/2019  . Shoulder pain    Work Related   Review of Systems:    Review of Systems  Constitutional: Negative for malaise/fatigue.  Eyes: Negative for blurred vision.  Respiratory: Negative for cough and shortness of breath.   Cardiovascular: Negative for chest pain, palpitations and leg swelling.  Gastrointestinal: Negative for abdominal pain.  Musculoskeletal: Negative for myalgias.  Neurological: Negative for dizziness and headaches.  All other systems reviewed and are negative.   Physical Exam:  Vitals:   11/06/20 1313 11/06/20 1318  BP: (!) 157/78 125/81  Pulse: 89 82  Temp: 98.7 F (37.1 C)   TempSrc: Oral   SpO2: 99%   Weight: 187 lb 14.4 oz (85.2 kg)   Height: 5\' 4"  (1.626 m)    Constitutional: well-appearing woman sitting in chair, in no acute distress HENT: normocephalic atraumatic, mucous membranes moist Eyes: conjunctiva non-erythematous Neck: supple Cardiovascular: normal rate and irregular rhythm, no m/r/g, no lower extremity edema Pulmonary/Chest: normal work of breathing on room air, lungs clear to auscultation bilaterally Abdominal: soft, non-tender, non-distended MSK: normal bulk and tone Neurological: alert & oriented x 3 Skin: warm and dry   Assessment & Plan:   See  Encounters Tab for problem based charting.  Patient seen with Dr. 

## 2020-11-06 NOTE — Assessment & Plan Note (Signed)
Patient with PHQ-9 of 12 today, extremely difficult. She denies history of depression. She reports around the holidays she thinks a lot about her husband who passed away 3 years ago. She denies SI or HI. States these feelings are usually self-limited.  Assessment/Plan: Patient has normal feelings of sadness around the holidays as she reflects on her husband's passing 3 years ago. - Offered referral to our clinic counselor, patient declines at this time - Repeat PHQ-9 at next visit

## 2020-11-06 NOTE — Progress Notes (Signed)
Internal Medicine Clinic Attending  I saw and evaluated the patient.  I personally confirmed the key portions of the history and exam documented by Dr. Watson and I reviewed pertinent patient test results.  The assessment, diagnosis, and plan were formulated together and I agree with the documentation in the resident's note.  

## 2020-11-07 LAB — BMP8+ANION GAP
Anion Gap: 15 mmol/L (ref 10.0–18.0)
BUN/Creatinine Ratio: 13 (ref 12–28)
BUN: 11 mg/dL (ref 8–27)
CO2: 25 mmol/L (ref 20–29)
Calcium: 10.1 mg/dL (ref 8.7–10.3)
Chloride: 99 mmol/L (ref 96–106)
Creatinine, Ser: 0.86 mg/dL (ref 0.57–1.00)
GFR calc Af Amer: 76 mL/min/{1.73_m2} (ref >59)
GFR calc non Af Amer: 66 mL/min/{1.73_m2} (ref >59)
Glucose: 123 mg/dL — ABNORMAL HIGH (ref 65–99)
Potassium: 4.2 mmol/L (ref 3.5–5.2)
Sodium: 139 mmol/L (ref 134–144)

## 2020-12-20 ENCOUNTER — Other Ambulatory Visit: Payer: Self-pay | Admitting: Student

## 2020-12-20 DIAGNOSIS — I10 Essential (primary) hypertension: Secondary | ICD-10-CM

## 2021-02-10 NOTE — Patient Instructions (Addendum)
Ms. Maulden,   Thank you for your visit to the [facility] today. It is always a pleasure seeing you. Today we discussed the following:  1) Hypertension, heart failure: Your blood pressure was great today, 130/76. - Continue all of your current medications - Continue your low-salt diet  2) Diabetes: Your A1c was stable at 7.1%. Great work! - Continue your metformin - Continue to eat healthfully and stay active. Keep encouraging your sister to get out and walk with you!  3) COVID booster - You are now eligible for your COVID booster. You can get this at any major pharmacy. OR if you are interested in getting vaccinated through [facility], you can call the [facility] COVID-19 hotline at [phone].   Please schedule a 48-month follow-up appointment with me in June. If you are feeling completely well at that time, you may push the appointment back to 69-month follow-up if you'd like (07/2021). Please bring all of your medications with you   If you have any questions or concerns, please call our clinic at [phone] between 9am-5pm. Outside of these hours, call [phone] and ask for the internal medicine resident on call. If you feel you are having a medical emergency please call 911.

## 2021-02-10 NOTE — Progress Notes (Signed)
   CC: HTN, DM follow-up  HPI:  [name] is a 75 y.o. woman with history as below who presents to clinic for follow-up of her chronic medical conditions. Her last clinic visit was on 11/06/20.   To see the details of this patient's management of their acute and chronic problems, please refer to the Assessment & Plan under the Encounters tab.    Past Medical History:  Diagnosis Date  . Anemia    -NOS- Positive stool guaiac cards, refuses colonoscopy  . Dizziness 02/23/2018  . Exposure to second hand smoke   . Hypercalcemia    NI SPEP, UPEP, PTH  . Hyperlipidemia   . Hypertension   . Hypokalemia    2nd diuretics  . Proteinuria   . Severe hypertension 07/22/2019  . Shoulder pain    Work Related   Review of Systems:    Review of Systems  Constitutional: Negative for malaise/fatigue.  Respiratory: Negative for shortness of breath.   Cardiovascular: Negative for chest pain, palpitations and leg swelling.  Gastrointestinal: Negative for abdominal pain.  Neurological: Negative for dizziness, weakness and headaches.  Psychiatric/Behavioral: Negative for depression.    Physical Exam:  Vitals:   02/11/21 0938 02/11/21 1018  BP: (!) 147/73 130/76  Pulse: 86 84  Temp: 98.2 F (36.8 C)   TempSrc: Oral   SpO2: 100%   Weight: 186 lb 11.2 oz (84.7 kg)   Height: 5\' 4"  (1.626 m)    Constitutional: very pleasant, well-appearing woman sitting in chair, in no acute distress HENT: normocephalic atraumatic, mucous membranes moist Eyes: conjunctiva non-erythematous Neck: supple Cardiovascular: regular rate and rhythm, no m/r/g; no lower extremity edema Pulmonary/Chest: normal work of breathing on room air, lungs clear to auscultation bilaterally Abdominal: soft, non-distended MSK: normal bulk and tone Neurological: alert & oriented x 3, slightly antalgic gait Skin: warm and dry Psych: normal mood and affect   Assessment & Plan:   See Encounters Tab for  problem-based charting.  Patient discussed with Dr. 

## 2021-02-11 ENCOUNTER — Encounter: Payer: Self-pay | Admitting: Student

## 2021-02-11 ENCOUNTER — Ambulatory Visit (INDEPENDENT_AMBULATORY_CARE_PROVIDER_SITE_OTHER): Payer: Medicare Other | Admitting: Student

## 2021-02-11 VITALS — BP 130/76 | HR 84 | Temp 98.2°F | Ht 64.0 in | Wt 186.7 lb

## 2021-02-11 DIAGNOSIS — E119 Type 2 diabetes mellitus without complications: Secondary | ICD-10-CM | POA: Diagnosis not present

## 2021-02-11 DIAGNOSIS — I110 Hypertensive heart disease with heart failure: Secondary | ICD-10-CM

## 2021-02-11 DIAGNOSIS — R4589 Other symptoms and signs involving emotional state: Secondary | ICD-10-CM

## 2021-02-11 DIAGNOSIS — I10 Essential (primary) hypertension: Secondary | ICD-10-CM

## 2021-02-11 DIAGNOSIS — I5020 Unspecified systolic (congestive) heart failure: Secondary | ICD-10-CM

## 2021-02-11 DIAGNOSIS — Z0000 Encounter for general adult medical examination without abnormal findings: Secondary | ICD-10-CM

## 2021-02-11 LAB — POCT GLYCOSYLATED HEMOGLOBIN (HGB A1C): Hemoglobin A1C: 7.1 % — AB (ref 4.0–5.6)

## 2021-02-11 LAB — GLUCOSE, CAPILLARY: Glucose-Capillary: 130 mg/dL — ABNORMAL HIGH (ref 70–99)

## 2021-02-11 MED ORDER — CARVEDILOL 6.25 MG PO TABS: 6.2500 mg | ORAL_TABLET | Freq: Two times a day (BID) | ORAL | 1 refills | Status: DC

## 2021-02-11 NOTE — Assessment & Plan Note (Signed)
BP 130/76 on repeat after initial 147/73. Sarah Branch reports adherence to her antihypertensives olmesartan 40 mg daily, amlodipine 10 mg daily, and carvedilol 6.25 mg twice daily. She denies headache, chest pain, dizziness, lightheadedness.   A/P: BP right at goal of <130/80. - continue amlodipine 10 mg daily - continue olmesartan 40 mg daily - continue carvedilol 6.25 mg twice daily (refilled today)

## 2021-02-11 NOTE — Assessment & Plan Note (Signed)
Patient states her mood is "very good." She reiterates how her relationship with her sister is important for her mental health, noting she speaks with her sister daily. Denies SI or HI.

## 2021-02-11 NOTE — Assessment & Plan Note (Signed)
Sarah Branch reports she has been doing well since our last visit 3 months ago. Denies increased shortness of breath at rest or with exertion, chest pain, lower extremity swelling, palpitations. Reports adherence to her medications.  Plan: - continue ARB and BB (olmesartan 40 mg daily and carvedilol 6.25 mg twice daily) - continue low-sodium diet

## 2021-02-11 NOTE — Assessment & Plan Note (Signed)
Patient is eligible for her COVID booster vaccine as of 01/16/21. She states she will get this in the community.

## 2021-02-11 NOTE — Assessment & Plan Note (Signed)
Hgb A1c stable at 7.1% today (from 7.3% 3 months ago). She denies polyuria, polydipsia, or symptoms of hypoglycemia. Reports adherence to metformin 500 mg twice daily. States in the colder months of the year she is not as active with her sister but anticipates to increase her activity level as the weather warms up.  Plan: Given that Sarah Branch's A1c has been stable for the last few years, favor monitoring A1c q6 months. - continue metformin 500 mg twice daily - repeat A1c in 6 months (~08/14/21)

## 2021-02-15 NOTE — Progress Notes (Signed)
Internal Medicine Clinic Attending  Case discussed with Dr. Watson  At the time of the visit.  We reviewed the resident's history and exam and pertinent patient test results.  I agree with the assessment, diagnosis, and plan of care documented in the resident's note.  

## 2021-04-12 ENCOUNTER — Encounter: Payer: Self-pay | Admitting: *Deleted

## 2021-05-07 ENCOUNTER — Other Ambulatory Visit: Payer: Self-pay

## 2021-05-07 ENCOUNTER — Ambulatory Visit (INDEPENDENT_AMBULATORY_CARE_PROVIDER_SITE_OTHER): Payer: Medicare Other | Admitting: Student

## 2021-05-07 VITALS — BP 137/59 | HR 73 | Temp 98.4°F | Ht 64.0 in | Wt 183.1 lb

## 2021-05-07 DIAGNOSIS — Z0000 Encounter for general adult medical examination without abnormal findings: Secondary | ICD-10-CM

## 2021-05-07 DIAGNOSIS — I5020 Unspecified systolic (congestive) heart failure: Secondary | ICD-10-CM

## 2021-05-07 DIAGNOSIS — Z1211 Encounter for screening for malignant neoplasm of colon: Secondary | ICD-10-CM | POA: Diagnosis not present

## 2021-05-07 DIAGNOSIS — I10 Essential (primary) hypertension: Secondary | ICD-10-CM | POA: Diagnosis not present

## 2021-05-07 DIAGNOSIS — E119 Type 2 diabetes mellitus without complications: Secondary | ICD-10-CM

## 2021-05-07 DIAGNOSIS — E7800 Pure hypercholesterolemia, unspecified: Secondary | ICD-10-CM

## 2021-05-07 LAB — POCT GLYCOSYLATED HEMOGLOBIN (HGB A1C): Hemoglobin A1C: 6.6 % — AB (ref 4.0–5.6)

## 2021-05-07 LAB — GLUCOSE, CAPILLARY: Glucose-Capillary: 107 mg/dL — ABNORMAL HIGH (ref 70–99)

## 2021-05-07 MED ORDER — AMLODIPINE BESYLATE 10 MG PO TABS: 10.0000 mg | ORAL_TABLET | Freq: Every day | ORAL | 3 refills | Status: DC

## 2021-05-07 MED ORDER — METFORMIN HCL 500 MG PO TABS: ORAL_TABLET | ORAL | 1 refills | Status: DC

## 2021-05-07 MED ORDER — PRAVASTATIN SODIUM 40 MG PO TABS: 40.0000 mg | ORAL_TABLET | Freq: Every day | ORAL | 3 refills | Status: DC

## 2021-05-07 MED ORDER — CARVEDILOL 6.25 MG PO TABS: 6.2500 mg | ORAL_TABLET | Freq: Two times a day (BID) | ORAL | 1 refills | Status: DC

## 2021-05-07 MED ORDER — OLMESARTAN MEDOXOMIL 40 MG PO TABS: 40.0000 mg | ORAL_TABLET | Freq: Every day | ORAL | 3 refills | Status: DC

## 2021-05-07 NOTE — Patient Instructions (Addendum)
[name],   Thank you for your visit to the [facility] Medicine Clinic today. It was a pleasure seeing you. Today we discussed the following:  1) Diabetes: your A1c was excellent today at 6.6%. Continue your metformin 500 mg twice daily. We will repeat your A1c in 6 months.  2) Refills: I refilled all of your medications except your allergy mediation, which you pick up over the counter.  4) Healthcare maintenance: - I have ordered your colon cancer screening test - the Fit test - you will pick it up today and bring back at your earliest convenience - I recommend you get your COVID booster at your earliest convenience  It has been an honor being your primary care doctor over the last year. You have been assigned a new PCP, Dr. Ave Filter. If you haven't already, you should be receiving a letter in the mail with this information. Please call the clinic on or after July 6 to schedule your next follow-up appointment. We would like to see you in 6 months.   If you have any questions or concerns, please call our clinic at [phone] between 9am-5pm. Outside of these hours, call [phone] and ask for the internal medicine resident on call. If you feel you are having a medical emergency please call 911.

## 2021-05-07 NOTE — Progress Notes (Signed)
Office Visit   Patient ID: Sarah Branch, female    DOB: August 26, 1946, 75 y.o.   MRN: [id number]   PCP: Sarah Lodge, MD   Subjective:   CC: diabetes, HTN follow-up; healthcare maintenance  HPI:  Sarah Branch is a 75 y.o. woman with history as below who presents to clinic for follow-up of her chronic medical conditions. Her last clinic visit was on 02/11/21.   To see the details of this patient's management of their acute and chronic problems, please refer to the Assessment & Plan under the Encounters tab.   Review of Systems:   Review of Systems  Constitutional:  Negative for chills and fever.  Eyes:  Negative for blurred vision.  Respiratory:  Negative for shortness of breath.   Cardiovascular:  Negative for chest pain.  Neurological:  Negative for dizziness and headaches.   Past Medical History:  Diagnosis Date   Anemia    -NOS- Positive stool guaiac cards, refuses colonoscopy   Dizziness 02/23/2018   Exposure to second hand smoke    Hypercalcemia    NI SPEP, UPEP, PTH   Hyperlipidemia    Hypertension    Hypokalemia    2nd diuretics   Proteinuria    Severe hypertension 07/22/2019   Shoulder pain    Work Related       ACTIVE MEDICATIONS   Outpatient Medications Prior to Visit  Medication Sig Dispense Refill   EQ LORATADINE 10 MG tablet TAKE ONE TABLET BY MOUTH ONCE DAILY (Patient taking differently: Take 10 mg by mouth daily. ) 90 tablet 3   amLODipine (NORVASC) 10 MG tablet Take 1 tablet (10 mg total) by mouth daily. 90 tablet 3   carvedilol (COREG) 6.25 MG tablet Take 1 tablet (6.25 mg total) by mouth 2 (two) times daily with a meal. 180 tablet 1   metFORMIN (GLUCOPHAGE) 500 MG tablet TAKE 2 TABLETS BY MOUTH IN THE MORNING AND TAKE 1 TABLET BY MOUTH IN THE EVENING 270 tablet 1   olmesartan (BENICAR) 40 MG tablet Take 1 tablet (40 mg total) by mouth daily. 90 tablet 3   pravastatin (PRAVACHOL) 40 MG tablet Take 1 tablet (40 mg total) by mouth daily. 90  tablet 3   No facility-administered medications prior to visit.   Objective:   BP (!) 137/59   Pulse 73   Temp 98.4 F (36.9 C) (Oral)   Ht 5' 4"  (1.626 m)   Wt 183 lb 1.6 oz (83.1 kg)   LMP 01/14/1995   SpO2 100%   BMI 31.43 kg/m  Wt Readings from Last 3 Encounters:  05/07/21 183 lb 1.6 oz (83.1 kg)  02/11/21 186 lb 11.2 oz (84.7 kg)  11/06/20 187 lb 14.4 oz (85.2 kg)   BP Readings from Last 3 Encounters:  05/07/21 (!) 137/59  02/11/21 130/76  11/06/20 125/81   Constitutional: very pleasant, well-appearing woman sitting in chair, in no acute distress HENT: normocephalic atraumatic, mucous membranes moist Eyes: conjunctiva non-erythematous Neck: supple Cardiovascular: regular rate and rhythm, no m/r/g, no lower extremity edema Pulmonary/Chest: normal work of breathing on room air, lungs clear to auscultation bilaterally Abdominal: non-distended MSK: normal bulk and tone Neurological: alert & oriented x 3, slightly antalgic normal gait Skin: warm and dry Psych: normal mood and affect  Health Maintenance:   Health Maintenance  Topic Date Due   TETANUS/TDAP  Never done   Zoster Vaccines- Shingrix (1 of 2) Never done   PNA vac Low Risk Adult (1 of 2 -  PCV13) Never done   COVID-19 Vaccine (3 - Booster for Pfizer series) 12/19/2020   COLON CANCER SCREENING ANNUAL FOBT  04/03/2021   INFLUENZA VACCINE  06/16/2021   OPHTHALMOLOGY EXAM  10/24/2021   COLONOSCOPY (Pts 45-9yr Insurance coverage will need to be confirmed)  10/29/2021   HEMOGLOBIN A1C  11/06/2021   FOOT EXAM  02/11/2022   LIPID PANEL  06/06/2024   DEXA SCAN  Completed   Hepatitis C Screening  Completed   HPV VACCINES  Aged Out   Assessment & Plan:   Problem List Items Addressed This Visit       Cardiovascular and Mediastinum   Essential hypertension (Chronic)    BP 137/59 on repeat after initial 160/74. Patient reports adherence to her antihypertensives. Denies headache, dizziness, lightheadedness,  chest pain.   A/P: BP slightly above goal of <130/80. Given her low diastolic pressure, will not make any adjustments to her regimen. - continue amlodipine 10 mg daily - continue olmesartan 40 mg daily - continue carvedilol 6.25 mg twice daily - refilled these meds today       Relevant Medications   amLODipine (NORVASC) 10 MG tablet   carvedilol (COREG) 6.25 MG tablet   olmesartan (BENICAR) 40 MG tablet   pravastatin (PRAVACHOL) 40 MG tablet   HFmrEF (heart failure with mildly reduced ejection fraction) (Chronic)    Patient states she has been doing well since our last visit. Denies symptoms of heart failure such as increased SOB, chest pain, lower extremity swelling. Weight is stable at 183 lb. - continue ARB and BB (olmesartan 40 mg daily and carvedilol 6.25 mg twice daily) - continue low sodium diet       Relevant Medications   amLODipine (NORVASC) 10 MG tablet   carvedilol (COREG) 6.25 MG tablet   olmesartan (BENICAR) 40 MG tablet   pravastatin (PRAVACHOL) 40 MG tablet     Endocrine   Diabetes mellitus without complication (HCC) - Primary (Chronic)    Hgb A1c 6.6% today from 7.1% 3 months ago.   A/P: Patient's DM is well-controlled on metformin 500 mg twice daily alone. Will proceed with q6 month A1c monitoring. - continue metformin 500 mg twice daily - repeat A1c in 6 months (~11/06/21)       Relevant Medications   metFORMIN (GLUCOPHAGE) 500 MG tablet   olmesartan (BENICAR) 40 MG tablet   pravastatin (PRAVACHOL) 40 MG tablet   Other Relevant Orders   POC Hbg A1C (Completed)   Glucose, capillary (Completed)     Other   Colon cancer screening (Chronic)    Fit testing ordered today.       Relevant Orders   Fecal occult blood, imunochemical   Healthcare maintenance (Chronic)    - Flu shot: [name] - COVID-19 vaccination: [place]; she is eligible for booster since 01/16/21. Again encouraged patient to get  her booster. - Pneumococcal vaccination: Counseled patient on Prevnar 20. She declines at this time. - Colon cancer screening: Fit testing negative on 04/03/20. Test kit ordered today. - Breast cancer screening: Mammogram completed 09/05/20, BIRADS 1, negative. Repeat referral after 09/05/21. - Cervical cancer screening: Patient is >65 and reports adequate screening previously. - Discuss Shingles vaccine at subsequent visits.       Hyperlipidemia (Chronic)   Relevant Medications   amLODipine (NORVASC) 10 MG tablet   carvedilol (COREG) 6.25 MG tablet   olmesartan (BENICAR) 40 MG tablet   pravastatin (PRAVACHOL) 40 MG tablet  Return in about 6 months (around 11/06/2021) for routine follow-up (diabetes, HTN).  Pt discussed with Dr. Jimmye Norman.  Sarah Lodge, MD Internal Medicine Resident, PGY-1 [facility] Pager: [phone] 9:21 AM, 05/09/2021

## 2021-05-09 ENCOUNTER — Encounter: Payer: Self-pay | Admitting: Student

## 2021-05-09 NOTE — Assessment & Plan Note (Signed)
BP 137/59 on repeat after initial 160/74. Patient reports adherence to her antihypertensives. Denies headache, dizziness, lightheadedness, chest pain.   A/P: BP slightly above goal of <130/80. Given her low diastolic pressure, will not make any adjustments to her regimen. - continue amlodipine 10 mg daily - continue olmesartan 40 mg daily - continue carvedilol 6.25 mg twice daily - refilled these meds today

## 2021-05-09 NOTE — Assessment & Plan Note (Signed)
Hgb A1c 6.6% today from 7.1% 3 months ago.   A/P: Patient's DM is well-controlled on metformin 500 mg twice daily alone. Will proceed with q6 month A1c monitoring. - continue metformin 500 mg twice daily - repeat A1c in 6 months (~11/06/21)

## 2021-05-09 NOTE — Assessment & Plan Note (Signed)
Patient states she has been doing well since our last visit. Denies symptoms of heart failure such as increased SOB, chest pain, lower extremity swelling. Weight is stable at 183 lb. - continue ARB and BB (olmesartan 40 mg daily and carvedilol 6.25 mg twice daily) - continue low sodium diet

## 2021-05-09 NOTE — Assessment & Plan Note (Signed)
-  Flu shot:[name] - COVID-19 vaccination:received 2nd [place] dose on 07/19/20; she is eligible for booster since 01/16/21. Again encouraged patient to get her booster. - Pneumococcal vaccination:Counseled patient on Prevnar 20. She declines at this time. - Colon cancer screening:Fit testing negative on 04/03/20. Test kit ordered today. - Breast cancer screening:Mammogram completed 09/05/20, BIRADS 1, negative. Repeat referral after 09/05/21. - Cervical cancer screening:Patient is >65 and reports adequate screening previously. - Discuss Shingles vaccine at subsequent visits.

## 2021-05-09 NOTE — Assessment & Plan Note (Signed)
Fit testing ordered today.

## 2021-05-20 NOTE — Progress Notes (Signed)
Internal Medicine Clinic Attending  Case discussed with Dr. Watson  At the time of the visit.  We reviewed the resident's history and exam and pertinent patient test results.  I agree with the assessment, diagnosis, and plan of care documented in the resident's note.  

## 2021-09-09 ENCOUNTER — Ambulatory Visit (INDEPENDENT_AMBULATORY_CARE_PROVIDER_SITE_OTHER): Payer: Medicare Other | Admitting: Internal Medicine

## 2021-09-09 ENCOUNTER — Other Ambulatory Visit: Payer: Self-pay

## 2021-09-09 ENCOUNTER — Encounter: Payer: Self-pay | Admitting: Internal Medicine

## 2021-09-09 VITALS — BP 151/90 | HR 82 | Temp 98.2°F | Resp 28 | Ht 64.0 in | Wt 180.3 lb

## 2021-09-09 DIAGNOSIS — I10 Essential (primary) hypertension: Secondary | ICD-10-CM

## 2021-09-09 DIAGNOSIS — E7800 Pure hypercholesterolemia, unspecified: Secondary | ICD-10-CM

## 2021-09-09 DIAGNOSIS — R600 Localized edema: Secondary | ICD-10-CM | POA: Diagnosis not present

## 2021-09-09 DIAGNOSIS — Z23 Encounter for immunization: Secondary | ICD-10-CM | POA: Diagnosis not present

## 2021-09-09 DIAGNOSIS — Z0000 Encounter for general adult medical examination without abnormal findings: Secondary | ICD-10-CM | POA: Diagnosis not present

## 2021-09-09 DIAGNOSIS — E119 Type 2 diabetes mellitus without complications: Secondary | ICD-10-CM

## 2021-09-09 DIAGNOSIS — I5020 Unspecified systolic (congestive) heart failure: Secondary | ICD-10-CM

## 2021-09-09 HISTORY — DX: Localized edema: R60.0

## 2021-09-09 LAB — POCT GLYCOSYLATED HEMOGLOBIN (HGB A1C): Hemoglobin A1C: 6.8 % — AB (ref 4.0–5.6)

## 2021-09-09 LAB — GLUCOSE, CAPILLARY: Glucose-Capillary: 108 mg/dL — ABNORMAL HIGH (ref 70–99)

## 2021-09-09 MED ORDER — METFORMIN HCL 500 MG PO TABS: ORAL_TABLET | ORAL | 3 refills | Status: DC

## 2021-09-09 MED ORDER — HYDROCHLOROTHIAZIDE 25 MG PO TABS: 25.0000 mg | ORAL_TABLET | Freq: Every day | ORAL | 3 refills | Status: DC

## 2021-09-09 MED ORDER — CARVEDILOL 6.25 MG PO TABS: 6.2500 mg | ORAL_TABLET | Freq: Two times a day (BID) | ORAL | 3 refills | Status: DC

## 2021-09-09 MED ORDER — OLMESARTAN MEDOXOMIL 40 MG PO TABS: 40.0000 mg | ORAL_TABLET | Freq: Every day | ORAL | 3 refills | Status: DC

## 2021-09-09 MED ORDER — PRAVASTATIN SODIUM 40 MG PO TABS: 40.0000 mg | ORAL_TABLET | Freq: Every day | ORAL | 3 refills | Status: DC

## 2021-09-09 NOTE — Assessment & Plan Note (Addendum)
Patient's current blood pressure not at goal.  Today's reading 151/90 on repeat check.  Patient currently taking Norvasc 10 mg; Coreg 6.5 mg, Benicar 40 mg daily.  Patient states she is compliant with her medications. Patient denies any symptoms at this time.  On physical exam, patient is noted to have +1 pitting edema of bilateral lower extremities.  Patient states the swelling has been ongoing for several months.    Amlodipine may be the cause of lower extremity edema.  Patient also has a history of HFmrEF, which could contribute to lower extremity swelling; however patient denies SOB, chest pain, or any acute symptoms of an exacerbation.   PLAN: BMP ordered today Hold amlodipine Started on HCTZ 25 mg daily Follow-up in 2 weeks to reassess blood pressure and improvement of lower extremity swelling

## 2021-09-09 NOTE — Assessment & Plan Note (Signed)
Patient's current A1c today is 6.8% increased from June 2022.  Patient currently on metformin 500 mg daily.  Patient states she is tolerating medication well.  Given patient's age A1c of less than 7% is acceptable.  Patient states she has been eating out more frequently lately.  Patient was counseled extensively on the importance of proper diet and exercise.  Patient acknowledges and agrees.   PLAN: Continue metformin 500 mg daily Continue diet and exercise.

## 2021-09-09 NOTE — Patient Instructions (Addendum)
Stop taking the Amlodipine for now; I wrote on the bottle which one to stop taking.   I sent Hydrochlorothiazide to your pharmacy; this is the new medication I want you to take daily. It will help with your blood pressure and lower leg swelling   I refilled all your medications with 90 day supply   Come back in 2 weeks to recheck blood pressure     It was nice meeting you!

## 2021-09-09 NOTE — Assessment & Plan Note (Signed)
Patient received flu shot today.  Patient was advised to go to local pharmacy to receive pneumococcal vaccine.  Patient acknowledges and agrees.  Patient declines shingles vaccine at this time.  Patient has FOBT kit at home and will attempt to complete by December.  Patient is also attempting to schedule yearly mammogram.

## 2021-09-09 NOTE — Assessment & Plan Note (Signed)
Patient currently takes pravastatin 40 mg daily.  Patient states she is tolerating medication well.  Last lipid profile was in 2020.    PLAN: Repeat lipid profile today

## 2021-09-09 NOTE — Progress Notes (Signed)
CC: BP check; LLE  HPI:  [name] is a 75 y.o. female with a past medical history stated below and presents today for cc listed above. Please see problem based assessment and plan for additional details.  Past Medical History:  Diagnosis Date   Anemia    -NOS- Positive stool guaiac cards, refuses colonoscopy   Dizziness 02/23/2018   Exposure to second hand smoke    Hypercalcemia    NI SPEP, UPEP, PTH   Hyperlipidemia    Hypertension    Hypokalemia    2nd diuretics   Proteinuria    Severe hypertension 07/22/2019   Shoulder pain    Work Related    Current Outpatient Medications on File Prior to Visit  Medication Sig Dispense Refill   amLODipine (NORVASC) 10 MG tablet Take 1 tablet (10 mg total) by mouth daily. 90 tablet 3   EQ LORATADINE 10 MG tablet TAKE ONE TABLET BY MOUTH ONCE DAILY (Patient taking differently: Take 10 mg by mouth daily. ) 90 tablet 3   No current facility-administered medications on file prior to visit.    Family History  Problem Relation Age of Onset   Cancer Mother    Vision loss Father     Social History   Socioeconomic History   Marital status: Married    Spouse name: Not on file   Number of children: Not on file   Years of education: Not on file   Highest education level: Not on file  Occupational History   Not on file  Tobacco Use   Smoking status: Never   Smokeless tobacco: Never   Tobacco comments:    Husband smoked around for years  Substance and Sexual Activity   Alcohol use: No    Alcohol/week: 0.0 standard drinks   Drug use: No   Sexual activity: Not on file  Other Topics Concern   Not on file  Social History Narrative   Financial assistance application initiated - further paperwork needed per [name] 02/25/2010   Wrked as Copy, now retired.   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   No alcohol, drug or regular exercise   Never smoked, but husband  has smoked around her for yrs.            Social Determinants of Health   Financial Resource Strain: Not on file  Food Insecurity: Not on file  Transportation Needs: Not on file  Physical Activity: Not on file  Stress: Not on file  Social Connections: Not on file  Intimate Partner Violence: Not on file    Review of Systems: ROS negative except for what is noted on the assessment and plan.  Vitals:   09/09/21 1026 09/09/21 1034  BP: (!) 155/78 (!) 151/90  Pulse: (!) 42 82  Resp: (!) 28   Temp: 98.2 F (36.8 C)   TempSrc: Oral   SpO2: 100%   Weight: 180 lb 4.8 oz (81.8 kg)   Height: 5\' 4"  (1.626 m)      Physical Exam: Constitutional: well-appearing elderly woman sitting in the chair, in no acute distress HENT: normocephalic atraumatic, mucous membranes moist Eyes: conjunctiva non-erythematous Neck: supple Cardiovascular: regular rate and rhythm, no m/r/g Pulmonary/Chest: normal work of breathing on room air, lungs clear to auscultation bilaterally Abdominal: soft, non-tender, non-distended MSK: normal bulk and tone Neurological: alert & oriented x 3, 5/5 strength in bilateral upper and lower extremities, normal gait  Skin: warm and dry Psych: normal mood; normal behavior    Assessment & Plan:   See Encounters Tab for problem based charting.  Patient seen with Dr. Shanon Rosser, M.D. [facility] Medicine, PGY-1 Pager: [phone], Phone: [phone] Date 09/09/2021 Time 1:13 PM

## 2021-09-09 NOTE — Assessment & Plan Note (Addendum)
Patient has been experiencing lower extremity edema for the last several months.  Patient does not use compression stockings or elevate her legs.  Patient takes amlodipine 10 mg daily for blood pressure control and patient has a history of HFmrEF.  Both are contributing factors for possible progressive lower extremity edema. Patient denies pain or cramping of the lower extremities.  Patient denies numbness or tingling of the lower extremities.  Patient states she has used compression stockings in the past but states she does not like them because they feel "too tight" on her legs.   PLAN: Hold amlodipine Started on HCTZ Follow-up in 2 weeks Patient was advised to elevate the legs

## 2021-09-10 LAB — BMP8+ANION GAP
Anion Gap: 18 mmol/L (ref 10.0–18.0)
BUN/Creatinine Ratio: 14 (ref 12–28)
BUN: 11 mg/dL (ref 8–27)
CO2: 22 mmol/L (ref 20–29)
Calcium: 10.2 mg/dL (ref 8.7–10.3)
Chloride: 101 mmol/L (ref 96–106)
Creatinine, Ser: 0.78 mg/dL (ref 0.57–1.00)
Glucose: 94 mg/dL (ref 70–99)
Potassium: 4.1 mmol/L (ref 3.5–5.2)
Sodium: 141 mmol/L (ref 134–144)
eGFR: 79 mL/min/{1.73_m2} (ref >59)

## 2021-09-10 LAB — LIPID PANEL
Chol/HDL Ratio: 3 ratio (ref 0.0–4.4)
Cholesterol, Total: 170 mg/dL (ref 100–199)
HDL: 56 mg/dL (ref >39)
LDL Chol Calc (NIH): 94 mg/dL (ref 0–99)
Triglycerides: 109 mg/dL (ref 0–149)
VLDL Cholesterol Cal: 20 mg/dL (ref 5–40)

## 2021-09-11 ENCOUNTER — Other Ambulatory Visit: Payer: Self-pay | Admitting: Internal Medicine

## 2021-09-11 DIAGNOSIS — Z1231 Encounter for screening mammogram for malignant neoplasm of breast: Secondary | ICD-10-CM

## 2021-09-23 NOTE — Progress Notes (Signed)
Internal Medicine Clinic Attending  I saw and evaluated the patient.  I personally confirmed the key portions of the history and exam documented by Dr. Ariwodo and I reviewed pertinent patient test results.  The assessment, diagnosis, and plan were formulated together and I agree with the documentation in the resident's note.   

## 2021-09-24 ENCOUNTER — Ambulatory Visit (INDEPENDENT_AMBULATORY_CARE_PROVIDER_SITE_OTHER): Payer: Medicare Other | Admitting: Student

## 2021-09-24 ENCOUNTER — Encounter: Payer: Self-pay | Admitting: Student

## 2021-09-24 DIAGNOSIS — I110 Hypertensive heart disease with heart failure: Secondary | ICD-10-CM

## 2021-09-24 DIAGNOSIS — I5020 Unspecified systolic (congestive) heart failure: Secondary | ICD-10-CM

## 2021-09-24 DIAGNOSIS — I10 Essential (primary) hypertension: Secondary | ICD-10-CM

## 2021-09-24 DIAGNOSIS — I499 Cardiac arrhythmia, unspecified: Secondary | ICD-10-CM

## 2021-09-24 MED ORDER — SPIRONOLACTONE 25 MG PO TABS: 25.0000 mg | ORAL_TABLET | Freq: Every day | ORAL | 2 refills | Status: DC

## 2021-09-24 NOTE — Assessment & Plan Note (Signed)
Patient with history of irregular heart rhythm.  This was appreciated on cardiac auscultation today.  EKGs in the past have demonstrated sinus arrhythmia with frequent APCs and some sinus pauses.  Patient has remained asymptomatic denies chest pain or shortness of breath.  Do not believe repeat ECG is necessary today.  We will continue to monitor closely.

## 2021-09-24 NOTE — Patient Instructions (Signed)
Thank you, [name] for allowing [place] to provide your care today. Today we discussed .  Blood Pressure   Please continue your current blood pressure medication regimen.  We will be adding a medication called spironolactone.  Please take this daily.  If you find yourself feeling lightheaded or dizzy please check your blood pressures at home and call our clinic.  If you notice any other side effects or have any new symptoms, please call our clinic.  I have ordered the following labs for you:  Lab Orders  No laboratory test(s) ordered today      Referrals ordered today:   Referral Orders  No referral(s) requested today     I have ordered the following medication/changed the following medications:   Stop the following medications: Medications Discontinued During This Encounter  Medication Reason   amLODipine (NORVASC) 10 MG tablet      Start the following medications: Meds ordered this encounter  Medications   spironolactone (ALDACTONE) 25 MG tablet    Sig: Take 1 tablet (25 mg total) by mouth daily.    Dispense:  30 tablet    Refill:  2     Follow up:  2 weeks blood pressure check    Should you have any questions or concerns please call the internal medicine clinic at [phone].    Thalia Bloodgood, D.O. [facility]

## 2021-09-24 NOTE — Assessment & Plan Note (Signed)
Assessment: Current blood pressure medication of Benicar 40 mg daily, Coreg 6.5 mg twice daily, HCTZ 25 mg daily.  Amlodipine was discontinued during her prior visit as this was thought to be causing some lower extremity edema.  Patient states that since changing the medication she has had no issues with leg swelling.  Suspect this is a combination of this being started on diuretic as well as the possibility that the amlodipine was contributing.  Her blood pressure remains elevated at 160/93 today.  With her history of HFrEF can consider other sickling agents for hypertension such as spironolactone.  We will start spironolactone 25 mg daily.  Will patient return in 2 weeks for BMP and to recheck her blood pressures.  BMP on prior visit was unremarkable and without electrolyte abnormalities.  Plan: -Continue Benicar, Coreg, HCTZ.  Start spironolactone -BMP in 2 weeks

## 2021-09-24 NOTE — Progress Notes (Signed)
CC: Hypertension  HPI:  [name] is a 75 y.o. female with a past medical history stated below and presents today for follow-up concerning her ongoing management of her hypertension. Please see problem based assessment and plan for additional details.  Past Medical History:  Diagnosis Date   Anemia    -NOS- Positive stool guaiac cards, refuses colonoscopy   Dizziness 02/23/2018   Exposure to second hand smoke    Hypercalcemia    NI SPEP, UPEP, PTH   Hyperlipidemia    Hypertension    Hypokalemia    2nd diuretics   Proteinuria    Severe hypertension 07/22/2019   Shoulder pain    Work Related    Current Outpatient Medications on File Prior to Visit  Medication Sig Dispense Refill   carvedilol (COREG) 6.25 MG tablet Take 1 tablet (6.25 mg total) by mouth 2 (two) times daily with a meal. 180 tablet 3   EQ LORATADINE 10 MG tablet TAKE ONE TABLET BY MOUTH ONCE DAILY (Patient taking differently: Take 10 mg by mouth daily. ) 90 tablet 3   hydrochlorothiazide (HYDRODIURIL) 25 MG tablet Take 1 tablet (25 mg total) by mouth daily. 90 tablet 3   metFORMIN (GLUCOPHAGE) 500 MG tablet TAKE 2 TABLETS BY MOUTH IN THE MORNING AND TAKE 1 TABLET BY MOUTH IN THE EVENING 270 tablet 3   olmesartan (BENICAR) 40 MG tablet Take 1 tablet (40 mg total) by mouth daily. 90 tablet 3   pravastatin (PRAVACHOL) 40 MG tablet Take 1 tablet (40 mg total) by mouth daily. 90 tablet 3   No current facility-administered medications on file prior to visit.    Family History  Problem Relation Age of Onset   Cancer Mother    Vision loss Father     Social History   Socioeconomic History   Marital status: Married    Spouse name: Not on file   Number of children: Not on file   Years of education: Not on file   Highest education level: Not on file  Occupational History   Not on file  Tobacco Use   Smoking status: Never   Smokeless tobacco: Never   Tobacco comments:    Husband smoked around for  years  Substance and Sexual Activity   Alcohol use: No    Alcohol/week: 0.0 standard drinks   Drug use: No   Sexual activity: Not on file  Other Topics Concern   Not on file  Social History Narrative   Financial assistance application initiated - further paperwork needed per Bonna Gains 02/25/2010   Wrked as Retail buyer, now retired.   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   No alcohol, drug or regular exercise   Never smoked, but husband has smoked around her for yrs.            Social Determinants of Health   Financial Resource Strain: Not on file  Food Insecurity: Not on file  Transportation Needs: Not on file  Physical Activity: Not on file  Stress: Not on file  Social Connections: Not on file  Intimate Partner Violence: Not on file    Review of Systems: ROS negative except for what is noted on the assessment and plan.  Vitals:   09/24/21 1037 09/24/21 1137  BP: (!) 161/83 (!) 160/93  Pulse: 82 76  Temp: 98.6 F (37 C)   TempSrc: Oral   SpO2: 99%   Weight: 180  lb 14.4 oz (82.1 kg)      Physical Exam: Constitutional: No acute distress HENT: normocephalic atraumatic Eyes: conjunctiva non-erythematous Neck: supple Cardiovascular: Irregular rhythm.  No murmurs gallops or rubs.  No JVD present.  No lower extremity edema present. Pulmonary/Chest: normal work of breathing on room air MSK: normal bulk and tone Neurological: alert & oriented x 3 Skin: warm and dry Psych: Normal mood and thought process   Assessment & Plan:   See Encounters Tab for problem based charting.  Patient discussed with Dr. Avie Echevaria, D.O. [facility] Internal Medicine, PGY-2 Pager: [phone], Phone: [phone] Date 09/24/2021 Time 6:09 PM

## 2021-09-25 NOTE — Progress Notes (Signed)
Internal Medicine Clinic Attending  Case discussed with Dr. Katsadouros  At the time of the visit.  We reviewed the resident's history and exam and pertinent patient test results.  I agree with the assessment, diagnosis, and plan of care documented in the resident's note.  

## 2021-10-15 ENCOUNTER — Ambulatory Visit (INDEPENDENT_AMBULATORY_CARE_PROVIDER_SITE_OTHER): Payer: Medicare Other | Admitting: Student

## 2021-10-15 ENCOUNTER — Encounter: Payer: Self-pay | Admitting: Student

## 2021-10-15 VITALS — BP 136/83 | HR 82 | Temp 98.3°F | Resp 20 | Ht 64.0 in | Wt 176.0 lb

## 2021-10-15 DIAGNOSIS — Z0000 Encounter for general adult medical examination without abnormal findings: Secondary | ICD-10-CM

## 2021-10-15 DIAGNOSIS — Z23 Encounter for immunization: Secondary | ICD-10-CM

## 2021-10-15 DIAGNOSIS — E119 Type 2 diabetes mellitus without complications: Secondary | ICD-10-CM

## 2021-10-15 DIAGNOSIS — I5020 Unspecified systolic (congestive) heart failure: Secondary | ICD-10-CM | POA: Diagnosis not present

## 2021-10-15 DIAGNOSIS — I110 Hypertensive heart disease with heart failure: Secondary | ICD-10-CM

## 2021-10-15 DIAGNOSIS — I10 Essential (primary) hypertension: Secondary | ICD-10-CM | POA: Diagnosis not present

## 2021-10-15 MED ORDER — CARVEDILOL 6.25 MG PO TABS: 6.2500 mg | ORAL_TABLET | Freq: Two times a day (BID) | ORAL | 3 refills | Status: DC

## 2021-10-15 MED ORDER — METFORMIN HCL 500 MG PO TABS: ORAL_TABLET | ORAL | 3 refills | Status: DC

## 2021-10-15 NOTE — Progress Notes (Signed)
CC: Hypertension  HPI:  [name] is a 75 y.o. female with a past medical history stated below and presents today for follow up after adjustments were made to her anti-hypertensive regimen. Please see problem based assessment and plan for additional details.  Past Medical History:  Diagnosis Date   Anemia    -NOS- Positive stool guaiac cards, refuses colonoscopy   Dizziness 02/23/2018   Exposure to second hand smoke    Hypercalcemia    NI SPEP, UPEP, PTH   Hyperlipidemia    Hypertension    Hypokalemia    2nd diuretics   Proteinuria    Severe hypertension 07/22/2019   Shoulder pain    Work Related    Current Outpatient Medications on File Prior to Visit  Medication Sig Dispense Refill   EQ LORATADINE 10 MG tablet TAKE ONE TABLET BY MOUTH ONCE DAILY (Patient taking differently: Take 10 mg by mouth daily. ) 90 tablet 3   hydrochlorothiazide (HYDRODIURIL) 25 MG tablet Take 1 tablet (25 mg total) by mouth daily. 90 tablet 3   olmesartan (BENICAR) 40 MG tablet Take 1 tablet (40 mg total) by mouth daily. 90 tablet 3   pravastatin (PRAVACHOL) 40 MG tablet Take 1 tablet (40 mg total) by mouth daily. 90 tablet 3   spironolactone (ALDACTONE) 25 MG tablet Take 1 tablet (25 mg total) by mouth daily. 30 tablet 2   No current facility-administered medications on file prior to visit.    Family History  Problem Relation Age of Onset   Cancer Mother    Vision loss Father     Social History   Socioeconomic History   Marital status: Married    Spouse name: Not on file   Number of children: Not on file   Years of education: Not on file   Highest education level: Not on file  Occupational History   Not on file  Tobacco Use   Smoking status: Never   Smokeless tobacco: Never   Tobacco comments:    Husband smoked around for years  Substance and Sexual Activity   Alcohol use: No    Alcohol/week: 0.0 standard drinks   Drug use: No   Sexual activity: Not on file  Other  Topics Concern   Not on file  Social History Narrative   Financial assistance application initiated - further paperwork needed per [name] 02/25/2010   Wrked as Copy, now retired.   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   No alcohol, drug or regular exercise   Never smoked, but husband has smoked around her for yrs.            Social Determinants of Health   Financial Resource Strain: Not on file  Food Insecurity: Not on file  Transportation Needs: Not on file  Physical Activity: Not on file  Stress: Not on file  Social Connections: Not on file  Intimate Partner Violence: Not on file    Review of Systems: ROS negative except for what is noted on the assessment and plan.  Vitals:   10/15/21 0938 10/15/21 0942 10/15/21 1046  BP: (!) 150/77 139/83 136/83  Pulse: 95 93 82  Resp: 20    Temp: 98.3 F (36.8 C)    TempSrc: Oral    SpO2: 100%    Weight: 176 lb (79.8 kg)    Height: 5\' 4"  (1.626 m)     Physical Exam: Constitutional: Well appearing,  no acute distress HENT: normocephalic atraumatic Eyes: conjunctiva non-erythematous Neck: supple Cardiovascular: regular rate Pulmonary/Chest: normal work of breathing on room air MSK: normal bulk and tone Neurological: alert & oriented x 3 Skin: warm and dry Psych: normal mood and thought process  Assessment & Plan:   See Encounters Tab for problem based charting.  Patient discussed with Dr. Dario Ave, D.O. [facility], PGY-2 Pager: [phone], Phone: [phone] Date 10/15/2021 Time 7:38 PM

## 2021-10-15 NOTE — Assessment & Plan Note (Addendum)
Assessment: Improvement of HTN after addition of spironolactone. Current regimen of benicar 40 mg daily, HCTZ 25 mg daily, and coreg 6.5 mg BID. Denies lightheadedness or dizziness. BMP to be performed today.   Plan: -continue regimen of benicar, coreg, HCTZ, and spironolactone 25 mg daily -BMP today  Addendum: Increase in Cr to 1.01 after addition of spironolactone, history of CKD stg II that has remained stable. Also found to be hypomagnesemic, last mag 2 years ago also low at 1.5. Will supplement magnesium and have patient return to clinic in 1 week for lab check to repeat magnesium and creatinine levels.

## 2021-10-15 NOTE — Patient Instructions (Addendum)
Thank you, Ms.Jerine Rettie Laird for allowing [place] to provide your care today. Today we discussed .  High blood pressure I am glad to see that your blood pressure is doing better.  I still encourage you to purchase a home blood pressure cuff and check your readings at home.  If you find yourself feeling lightheaded or dizzy upon standing or any other new symptoms occur please call our clinic.  Healthcare maintenance We will be performing we will be giving you a pneumonia vaccine today.  Please also consider shingles vaccine.  I have ordered the following labs for you:  Lab Orders         BMP8+Anion Gap       Referrals ordered today:   Referral Orders  No referral(s) requested today     I have ordered the following medication/changed the following medications:   Stop the following medications: Medications Discontinued During This Encounter  Medication Reason   carvedilol (COREG) 6.25 MG tablet Reorder   metFORMIN (GLUCOPHAGE) 500 MG tablet Reorder     Start the following medications: Meds ordered this encounter  Medications   carvedilol (COREG) 6.25 MG tablet    Sig: Take 1 tablet (6.25 mg total) by mouth 2 (two) times daily with a meal.    Dispense:  180 tablet    Refill:  3    Maximum Refills Reached   metFORMIN (GLUCOPHAGE) 500 MG tablet    Sig: TAKE 2 TABLETS BY MOUTH IN THE MORNING AND TAKE 1 TABLET BY MOUTH IN THE EVENING    Dispense:  270 tablet    Refill:  3    pt has an appt on dec 22 but does not have enought medication to last until then     Follow up:  1 month to see your primary care provider     Should you have any questions or concerns please call the internal medicine clinic at [phone].    Thalia Bloodgood, D.O. [facility]

## 2021-10-15 NOTE — Assessment & Plan Note (Signed)
Pneumococcal 20 vaccine given today in clinic.

## 2021-10-16 LAB — BMP8+ANION GAP
Anion Gap: 17 mmol/L (ref 10.0–18.0)
BUN/Creatinine Ratio: 20 (ref 12–28)
BUN: 20 mg/dL (ref 8–27)
CO2: 23 mmol/L (ref 20–29)
Calcium: 10.4 mg/dL — ABNORMAL HIGH (ref 8.7–10.3)
Chloride: 98 mmol/L (ref 96–106)
Creatinine, Ser: 1.01 mg/dL — ABNORMAL HIGH (ref 0.57–1.00)
Glucose: 91 mg/dL (ref 70–99)
Potassium: 4.2 mmol/L (ref 3.5–5.2)
Sodium: 138 mmol/L (ref 134–144)
eGFR: 58 mL/min/{1.73_m2} — ABNORMAL LOW (ref >59)

## 2021-10-16 LAB — MAGNESIUM: Magnesium: 1.5 mg/dL — ABNORMAL LOW (ref 1.6–2.3)

## 2021-10-17 ENCOUNTER — Ambulatory Visit
Admission: RE | Admit: 2021-10-17 | Discharge: 2021-10-17 | Disposition: A | Discharge status: Home / Self Care | Payer: Medicare Other | Source: Ambulatory Visit | Attending: Internal Medicine | Admitting: Internal Medicine

## 2021-10-17 DIAGNOSIS — Z1231 Encounter for screening mammogram for malignant neoplasm of breast: Secondary | ICD-10-CM

## 2021-10-17 MED ORDER — MAGNESIUM OXIDE 400 MG PO CAPS: 1.0000 | ORAL_CAPSULE | Freq: Every day | ORAL | 0 refills | Status: AC

## 2021-10-17 NOTE — Addendum Note (Signed)
Addended by: Belva Agee on: 10/17/2021 02:08 PM   Modules accepted: Orders

## 2021-10-28 DIAGNOSIS — Z961 Presence of intraocular lens: Secondary | ICD-10-CM | POA: Diagnosis not present

## 2021-10-28 DIAGNOSIS — E119 Type 2 diabetes mellitus without complications: Secondary | ICD-10-CM | POA: Diagnosis not present

## 2021-10-28 DIAGNOSIS — H26493 Other secondary cataract, bilateral: Secondary | ICD-10-CM | POA: Diagnosis not present

## 2021-10-28 DIAGNOSIS — H31101 Choroidal degeneration, unspecified, right eye: Secondary | ICD-10-CM | POA: Diagnosis not present

## 2021-10-28 LAB — HM DIABETES EYE EXAM

## 2021-10-28 NOTE — Progress Notes (Signed)
Internal Medicine Clinic Attending  Case discussed with Dr. Katsadouros  At the time of the visit.  We reviewed the resident's history and exam and pertinent patient test results.  I agree with the assessment, diagnosis, and plan of care documented in the resident's note.  

## 2021-11-04 ENCOUNTER — Other Ambulatory Visit: Payer: Self-pay

## 2021-11-20 ENCOUNTER — Encounter: Payer: Self-pay | Admitting: Dietician

## 2021-11-24 ENCOUNTER — Ambulatory Visit (INDEPENDENT_AMBULATORY_CARE_PROVIDER_SITE_OTHER): Payer: Medicare Other | Admitting: Student

## 2021-11-24 VITALS — BP 142/60 | HR 75 | Temp 97.9°F | Ht 64.0 in | Wt 176.7 lb

## 2021-11-24 DIAGNOSIS — I110 Hypertensive heart disease with heart failure: Secondary | ICD-10-CM | POA: Diagnosis not present

## 2021-11-24 DIAGNOSIS — I10 Essential (primary) hypertension: Secondary | ICD-10-CM | POA: Diagnosis not present

## 2021-11-24 DIAGNOSIS — I5020 Unspecified systolic (congestive) heart failure: Secondary | ICD-10-CM | POA: Diagnosis not present

## 2021-11-24 DIAGNOSIS — E119 Type 2 diabetes mellitus without complications: Secondary | ICD-10-CM

## 2021-11-24 DIAGNOSIS — Z0000 Encounter for general adult medical examination without abnormal findings: Secondary | ICD-10-CM

## 2021-11-24 LAB — POCT GLYCOSYLATED HEMOGLOBIN (HGB A1C): Hemoglobin A1C: 7 % — AB (ref 4.0–5.6)

## 2021-11-24 LAB — GLUCOSE, CAPILLARY: Glucose-Capillary: 90 mg/dL (ref 70–99)

## 2021-11-24 MED ORDER — CARVEDILOL 12.5 MG PO TABS: 6.2500 mg | ORAL_TABLET | Freq: Two times a day (BID) | ORAL | 3 refills | Status: DC

## 2021-11-24 MED ORDER — OLMESARTAN MEDOXOMIL-HCTZ 40-25 MG PO TABS: 1.0000 | ORAL_TABLET | Freq: Every day | ORAL | 1 refills | Status: DC

## 2021-11-24 NOTE — Patient Instructions (Addendum)
It was a pleasure seeing you in clinic. Today we discussed:   Blood pressure:  Your BP was elevated Increase Carvedilol to 12.6 mg twice daily Continue you olmesartan, HCTZ, and spironolactone.  Start olmesartan-HCTZ combination pill after finishing your current bottles. Consider checking you BP between visit with a BP cuff at home to see if your medications are helping Follow up in 2 weeks  Magnesium: I will call you to let you know if you need to continue to take magnesium  Diabetes: You A1c is doing well. Please try to refrain from juices and sodas  Labwork: I will call with the results  If you have any questions or concerns, please call our clinic at [phone] between 9am-5pm and after hours call [phone] and ask for the internal medicine resident on call. If you feel you are having a medical emergency please call 911.   Thank you, we look forward to helping you remain healthy!

## 2021-11-24 NOTE — Progress Notes (Signed)
69

## 2021-11-25 LAB — MICROALBUMIN / CREATININE URINE RATIO
Creatinine, Urine: 52.1 mg/dL
Microalb/Creat Ratio: 61 mg/g creat — ABNORMAL HIGH (ref 0–29)
Microalbumin, Urine: 31.7 ug/mL

## 2021-11-25 LAB — CMP14 + ANION GAP
ALT: 11 IU/L (ref 0–32)
AST: 17 IU/L (ref 0–40)
Albumin/Globulin Ratio: 1.3 (ref 1.2–2.2)
Albumin: 4.1 g/dL (ref 3.7–4.7)
Alkaline Phosphatase: 61 IU/L (ref 44–121)
Anion Gap: 14 mmol/L (ref 10.0–18.0)
BUN/Creatinine Ratio: 13 (ref 12–28)
BUN: 13 mg/dL (ref 8–27)
Bilirubin Total: 0.4 mg/dL (ref 0.0–1.2)
CO2: 26 mmol/L (ref 20–29)
Calcium: 10.3 mg/dL (ref 8.7–10.3)
Chloride: 97 mmol/L (ref 96–106)
Creatinine, Ser: 0.98 mg/dL (ref 0.57–1.00)
Globulin, Total: 3.1 g/dL (ref 1.5–4.5)
Glucose: 87 mg/dL (ref 70–99)
Potassium: 4.1 mmol/L (ref 3.5–5.2)
Sodium: 137 mmol/L (ref 134–144)
Total Protein: 7.2 g/dL (ref 6.0–8.5)
eGFR: 60 mL/min/{1.73_m2} (ref >59)

## 2021-11-25 LAB — MAGNESIUM: Magnesium: 1.4 mg/dL — ABNORMAL LOW (ref 1.6–2.3)

## 2021-11-25 MED ORDER — MAGNESIUM 400 MG PO CAPS: 1.0000 | ORAL_CAPSULE | Freq: Every day | ORAL | 0 refills | Status: AC

## 2021-11-25 MED ORDER — CARVEDILOL 12.5 MG PO TABS: 12.5000 mg | ORAL_TABLET | Freq: Two times a day (BID) | ORAL | 3 refills | Status: DC

## 2021-11-25 NOTE — Assessment & Plan Note (Signed)
Magnesium low at 1.5 at last visit 1 month ago has been taking p.o. magnesium supplements.  On repeat magnesium today found to have a magnesium level of 1.4.  She does note she occasionally has diarrhea which she attributes to her metformin.  This may be the cause of her low magnesium.   -continue with oral magnesium -recheck her levels at her next visit in 2 weeks -Discuss diarrhea further at her next visit

## 2021-11-25 NOTE — Assessment & Plan Note (Signed)
Patient declined flu shot, will consider shingles vaccine

## 2021-11-25 NOTE — Assessment & Plan Note (Signed)
Patient presents today for blood pressure follow-up. BP elevated today 145/79 and on repeat 142/60.  Reports compliance with olmesartan 40 mg daily, HCTZ 25 mg daily, spironolactone 25 mg daily, and Coreg 6.5 mg twice daily.  Discussed that her blood pressure remains elevated today.  She does not check her blood pressure at home because she feels that this should be checked.  Discussed that it would be helpful to have some home measurements as well as she cannot always be in clinic to have her blood pressure monitored she would like to continue thinking about checking her blood pressures at home.  Noted to have some elevation in creatinine at her last visit about 1 month ago.  Also notable to have an increase in calcium of 10.4. We will draw CMP today.   Discussed that we need to set her BP meds given her elevated pressures today.  Discussed adding on amlodipine although she is very resistant given the number of medications she is on.  We will instead increase her Coreg and combine her olmesartan with HCTZ to reduce pill burden.   Plan -Increase Coreg to 12.5 mg twice daily -Consolidate thiazide and ARB to olmesartan-hydrochlorothiazide 40-25 mg daily -If BPs still elevated she may be more agreeable to CCB after consolidation medication -CMP today, if creatinine and calcium remains elevated consider work up for MM  ADDENDUM CMP today with BUN of 13 from 20, with creatinine down to 0.98 from 1.01 at last visit.  Her baseline creatinine is around 0.9, calcium improved to 10.3 she has a normal albumin of 4.1.  Given improvement we will continue to monitor serum levels and hold off on further testing

## 2021-11-25 NOTE — Assessment & Plan Note (Addendum)
A1c today of 7 from 6.8 at last visit.  Over the last 6 months have some elevation in her A1c from 6.67.  Does note she frequently drinks apple juice and soda's in the morning.  Counseled her to reduce sugar intake juices and sodas patient is agreeable to making this change.  Currently on metformin 1000 mg in the morning and 500 mg in the evening.  Does report some diarrhea with this.  Was also found to be hypomagnesemic at 1.5 at last visit we will hold off on increasing her metformin at this time.  Encouraged her to make diet and exercise modifications.    Follow-up in 3 months If A1c continues to increase could consider starting her on SGLT2 in the future given she also has a history of heart failure with mildly reduced EF as well as elevated BPs

## 2021-11-25 NOTE — Progress Notes (Signed)
Internal Medicine Clinic Attending ? ?Case discussed with Dr. Liang  At the time of the visit.  We reviewed the resident?s history and exam and pertinent patient test results.  I agree with the assessment, diagnosis, and plan of care documented in the resident?s note. ? ?

## 2021-11-25 NOTE — Progress Notes (Signed)
° °  CC: BP follow up  HPI:  [name] is a 76 y.o. female with past medical history listed below presents for follow-up of blood pressure and diabetes.  Also noted to have elevated serum creatinine as well as magnesium at next visit. Please refer to problem based charting for further details and assessment and plan of current problem and chronic medical conditions.  Past Medical History:  Diagnosis Date   Anemia    -NOS- Positive stool guaiac cards, refuses colonoscopy   Dizziness 02/23/2018   Exposure to second hand smoke    Hypercalcemia    NI SPEP, UPEP, PTH   Hyperlipidemia    Hypertension    Hypokalemia    2nd diuretics   Proteinuria    Severe hypertension 07/22/2019   Shoulder pain    Work Related   Review of Systems:  Negative as per HPI  Physical Exam:  Vitals:   11/24/21 0902 11/24/21 0946  BP: (!) 145/79 (!) 142/60  Pulse: 82 75  Temp: 97.9 F (36.6 C)   TempSrc: Oral   SpO2: 100%   Weight: 176 lb 11.2 oz (80.2 kg)   Height: 5\' 4"  (1.626 m)    Constitutional: Appears well-developed and well-nourished. No distress.  HENT: Normocephalic and atraumatic, EOMI, conjunctiva normal, moist mucous membranes Cardiovascular: Normal rate, regular rhythm Respiratory: No respiratory distress, no accessory muscle use.  Effort is normal.  Lungs are clear to auscultation bilaterally. GI: Nondistended, soft, nontender to palpation, normal active bowel sounds Musculoskeletal: Normal bulk and tone.  No peripheral edema noted. Neurological: Is alert and oriented x4, no apparent focal deficits noted. Skin: Warm and dry.  No rash, erythema, lesions noted. Psychiatric: Normal mood and affect.  Assessment & Plan:   See Encounters Tab for problem based charting.  Patient discussed with Dr.  Cain Sieve

## 2021-11-25 NOTE — Addendum Note (Signed)
Addended by: Iona Beard on: 11/25/2021 02:44 PM   Modules accepted: Orders

## 2021-12-08 ENCOUNTER — Ambulatory Visit (INDEPENDENT_AMBULATORY_CARE_PROVIDER_SITE_OTHER): Payer: Medicare Other | Admitting: Student

## 2021-12-08 ENCOUNTER — Encounter: Payer: Self-pay | Admitting: Student

## 2021-12-08 DIAGNOSIS — I10 Essential (primary) hypertension: Secondary | ICD-10-CM

## 2021-12-08 DIAGNOSIS — I110 Hypertensive heart disease with heart failure: Secondary | ICD-10-CM | POA: Diagnosis not present

## 2021-12-08 DIAGNOSIS — I5020 Unspecified systolic (congestive) heart failure: Secondary | ICD-10-CM

## 2021-12-08 DIAGNOSIS — E119 Type 2 diabetes mellitus without complications: Secondary | ICD-10-CM

## 2021-12-08 MED ORDER — CARVEDILOL 12.5 MG PO TABS: 12.5000 mg | ORAL_TABLET | Freq: Two times a day (BID) | ORAL | 3 refills | Status: DC

## 2021-12-08 MED ORDER — SPIRONOLACTONE 25 MG PO TABS: 25.0000 mg | ORAL_TABLET | Freq: Every day | ORAL | 2 refills | Status: DC

## 2021-12-08 MED ORDER — METFORMIN HCL ER 500 MG PO TB24: 500.0000 mg | ORAL_TABLET | Freq: Two times a day (BID) | ORAL | 3 refills | Status: DC

## 2021-12-08 NOTE — Assessment & Plan Note (Addendum)
She endorses having 2-3 loose bowel movements.  No liquid stools.  Denies any hematochezia, abdominal pain, nausea, vomiting, constipation, fever or chills.  Associated symptoms with taking medications.  This has been present for at least a year she cannot remember exactly how long this has been going on.  This likely is contributing to her hypomagnesemia.  She has been taking p.o. magnesium which she has not noticed has changed her loose stools.  Will switch her metformin to extended release and decrease dose today 500 twice daily with meals to see if that helps.  We will also recheck magnesium today.  Mg remains low at 1.5 today. Will have her finish supplements and see if diarrhea improves with changing metformin formulation and decreasing dose, and recheck at next visit.

## 2021-12-08 NOTE — Assessment & Plan Note (Addendum)
BP 122/80 today well controlled with increasing her carvedilol to 12.5 mg twice daily. Reports compliance with tolmesartan-hctz 40-25 mg daily. Will continue on current medications.

## 2021-12-08 NOTE — Patient Instructions (Addendum)
It was a pleasure seeing you in clinic. Today we discussed:   Low magnesium  Please continue taking the magnesium supplements  Diabetes Please try the new metformin and take 500 mg (one tablet) twice a day  Blood pressure Your blood pressure is doing well continue with your medications  Follow up in 3 months to repeat A1c  If you have any questions or concerns, please call our clinic at [phone] between 9am-5pm and after hours call [phone] and ask for the internal medicine resident on call. If you feel you are having a medical emergency please call 911.   Thank you, we look forward to helping you remain healthy!

## 2021-12-09 LAB — MAGNESIUM: Magnesium: 1.5 mg/dL — ABNORMAL LOW (ref 1.6–2.3)

## 2021-12-09 NOTE — Progress Notes (Signed)
° °  CC: BP follow up  HPI:  [name] is a 76 y.o. female presents for follow up of BP. Please refer to problem based charting for further details and assessment and plan of current problem and chronic medical conditions.   Past Medical History:  Diagnosis Date   Anemia    -NOS- Positive stool guaiac cards, refuses colonoscopy   Dizziness 02/23/2018   Exposure to second hand smoke    Hypercalcemia    NI SPEP, UPEP, PTH   Hyperlipidemia    Hypertension    Hypokalemia    2nd diuretics   Proteinuria    Severe hypertension 07/22/2019   Shoulder pain    Work Related   Review of Systems:  Negative as per HPI  Physical Exam:  Vitals:   12/08/21 0946  BP: 122/80  Pulse: 87  Temp: 98.1 F (36.7 C)  TempSrc: Oral  SpO2: 100%  Weight: 178 lb 6.4 oz (80.9 kg)  Height: 5\' 4"  (1.626 m)   Physical Exam Constitutional:      Appearance: Normal appearance.  HENT:     Mouth/Throat:     Mouth: Mucous membranes are moist.     Pharynx: Oropharynx is clear.  Cardiovascular:     Rate and Rhythm: Normal rate and regular rhythm.  Pulmonary:     Effort: Pulmonary effort is normal.     Breath sounds: No rhonchi or rales.  Abdominal:     General: Abdomen is flat. Bowel sounds are normal. There is no distension.     Palpations: Abdomen is soft. There is no mass.     Tenderness: There is no abdominal tenderness.  Musculoskeletal:        General: Normal range of motion.     Right lower leg: No edema.     Left lower leg: No edema.  Skin:    General: Skin is warm and dry.     Capillary Refill: Capillary refill takes less than 2 seconds.     Comments: Normal skin turgor  Neurological:     General: No focal deficit present.     Mental Status: She is alert and oriented to person, place, and time.  Psychiatric:        Mood and Affect: Mood normal.        Behavior: Behavior normal.     Assessment & Plan:   See Encounters Tab for problem based charting.  Patient  discussed with Dr. Dareen Piano

## 2021-12-09 NOTE — Assessment & Plan Note (Signed)
Has been able to reduce juice and soda consumption. States she is feeling well. Notes she has been having loose stool with eating with about 3 BM a day. Feels this may have start after staring medications although not sure which one. Also with ongoing hypomagnesemia likely in setting of frequent BMs. Denies abdominal pain, hematochezia, melena, fever, chills, nausea, or vomiting. Exam unremarkable. Will switch her metformin to XR and decrease her back to 500 mg twice daily to see if this helps. If diabetes not well controlled could consider switching her to a SLGT-2 in the further.  Metformin XR 500 mg twice daily A1c in 3 months

## 2021-12-11 NOTE — Progress Notes (Signed)
Internal Medicine Clinic Attending ? ?Case discussed with Dr. Liang  At the time of the visit.  We reviewed the resident?s history and exam and pertinent patient test results.  I agree with the assessment, diagnosis, and plan of care documented in the resident?s note. ? ?

## 2021-12-18 ENCOUNTER — Other Ambulatory Visit: Payer: Self-pay | Admitting: Student

## 2022-03-16 ENCOUNTER — Ambulatory Visit (INDEPENDENT_AMBULATORY_CARE_PROVIDER_SITE_OTHER): Payer: Medicare Other | Admitting: Internal Medicine

## 2022-03-16 ENCOUNTER — Encounter: Payer: Self-pay | Admitting: Internal Medicine

## 2022-03-16 VITALS — BP 121/67 | HR 88 | Temp 98.4°F | Ht 64.0 in | Wt 183.4 lb

## 2022-03-16 DIAGNOSIS — E7800 Pure hypercholesterolemia, unspecified: Secondary | ICD-10-CM

## 2022-03-16 DIAGNOSIS — I110 Hypertensive heart disease with heart failure: Secondary | ICD-10-CM | POA: Diagnosis not present

## 2022-03-16 DIAGNOSIS — I5020 Unspecified systolic (congestive) heart failure: Secondary | ICD-10-CM

## 2022-03-16 DIAGNOSIS — E119 Type 2 diabetes mellitus without complications: Secondary | ICD-10-CM

## 2022-03-16 DIAGNOSIS — Z0000 Encounter for general adult medical examination without abnormal findings: Secondary | ICD-10-CM

## 2022-03-16 DIAGNOSIS — T7840XA Allergy, unspecified, initial encounter: Secondary | ICD-10-CM | POA: Diagnosis not present

## 2022-03-16 DIAGNOSIS — I10 Essential (primary) hypertension: Secondary | ICD-10-CM

## 2022-03-16 LAB — POCT GLYCOSYLATED HEMOGLOBIN (HGB A1C): Hemoglobin A1C: 7.1 % — AB (ref 4.0–5.6)

## 2022-03-16 LAB — GLUCOSE, CAPILLARY: Glucose-Capillary: 99 mg/dL (ref 70–99)

## 2022-03-16 MED ORDER — PRAVASTATIN SODIUM 40 MG PO TABS: 40.0000 mg | ORAL_TABLET | Freq: Every day | ORAL | 3 refills | Status: DC

## 2022-03-16 MED ORDER — LORATADINE 10 MG PO TABS: 10.0000 mg | ORAL_TABLET | Freq: Every day | ORAL | 3 refills | Status: AC

## 2022-03-16 MED ORDER — OLMESARTAN MEDOXOMIL-HCTZ 40-25 MG PO TABS: 1.0000 | ORAL_TABLET | Freq: Every day | ORAL | 0 refills | Status: DC

## 2022-03-16 MED ORDER — SPIRONOLACTONE 25 MG PO TABS: 25.0000 mg | ORAL_TABLET | Freq: Every day | ORAL | 2 refills | Status: DC

## 2022-03-16 MED ORDER — CARVEDILOL 12.5 MG PO TABS: 12.5000 mg | ORAL_TABLET | Freq: Two times a day (BID) | ORAL | 3 refills | Status: DC

## 2022-03-16 NOTE — Patient Instructions (Signed)
Thank you, [name] for allowing [place] to provide your care today. Today we discussed your blood pressure is good and you are up to date with your overall care.  Good job! ? ?I have ordered the following labs for you: ? ?Lab Orders    ?     Magnesium    ?     POC Hbg A1C    ?  ? ?I have ordered the following medication/changed the following medications:  ? ?Stop the following medications: ?Medications Discontinued During This Encounter  ?Medication Reason  ? EQ LORATADINE 10 MG tablet Reorder  ? pravastatin (PRAVACHOL) 40 MG tablet Reorder  ? olmesartan-hydrochlorothiazide (BENICAR HCT) 40-25 MG tablet Reorder  ? spironolactone (ALDACTONE) 25 MG tablet Reorder  ? carvedilol (COREG) 12.5 MG tablet Reorder  ?  ? ?Start the following medications: ?Meds ordered this encounter  ?Medications  ? carvedilol (COREG) 12.5 MG tablet  ?  Sig: Take 1 tablet (12.5 mg total) by mouth 2 (two) times daily with a meal.  ?  Dispense:  180 tablet  ?  Refill:  3  ?  Maximum Refills Reached  ? loratadine (EQ LORATADINE) 10 MG tablet  ?  Sig: Take 1 tablet (10 mg total) by mouth daily.  ?  Dispense:  90 tablet  ?  Refill:  3  ? olmesartan-hydrochlorothiazide (BENICAR HCT) 40-25 MG tablet  ?  Sig: Take 1 tablet by mouth daily.  ?  Dispense:  90 tablet  ?  Refill:  0  ? pravastatin (PRAVACHOL) 40 MG tablet  ?  Sig: Take 1 tablet (40 mg total) by mouth daily.  ?  Dispense:  90 tablet  ?  Refill:  3  ?  Prescription Expired  ? spironolactone (ALDACTONE) 25 MG tablet  ?  Sig: Take 1 tablet (25 mg total) by mouth daily.  ?  Dispense:  30 tablet  ?  Refill:  2  ?  ? ?Follow up: 6 months  ? ?Remember: Continue taking all your medications are prescribed and try to exercise more. ? ?Should you have any questions or concerns please call the internal medicine clinic at [phone].   ? ?Sarah Filbert, MD ?[facility] ?  ?

## 2022-03-16 NOTE — Assessment & Plan Note (Signed)
Medications refilled

## 2022-03-16 NOTE — Progress Notes (Signed)
? ?CC: Blood pressure check, medication refill ? ?HPI: ? ?Sarah Branch is a 76 y.o. female with a past medical history stated below and presents today for CC listed above. Please see problem based assessment and plan for additional details. ? ?Past Medical History:  ?Diagnosis Date  ? Anemia   ? -NOS- Positive stool guaiac cards, refuses colonoscopy  ? Dizziness 02/23/2018  ? Exposure to second hand smoke   ? Hypercalcemia   ? NI SPEP, UPEP, PTH  ? Hyperlipidemia   ? Hypertension   ? Hypokalemia   ? 2nd diuretics  ? Proteinuria   ? Severe hypertension 07/22/2019  ? Shoulder pain   ? Work Related  ? ? ?Current Outpatient Medications on File Prior to Visit  ?Medication Sig Dispense Refill  ? metFORMIN (GLUCOPHAGE-XR) 500 MG 24 hr tablet Take 1 tablet (500 mg total) by mouth 2 (two) times daily with a meal. 180 tablet 3  ? ?No current facility-administered medications on file prior to visit.  ? ? ?Family History  ?Problem Relation Age of Onset  ? Cancer Mother   ? Vision loss Father   ? ? ?Social History  ? ?Socioeconomic History  ? Marital status: Married  ?  Spouse name: Not on file  ? Number of children: Not on file  ? Years of education: Not on file  ? Highest education level: Not on file  ?Occupational History  ? Not on file  ?Tobacco Use  ? Smoking status: Never  ? Smokeless tobacco: Never  ? Tobacco comments:  ?  Husband smoked around for years  ?Substance and Sexual Activity  ? Alcohol use: No  ?  Alcohol/week: 0.0 standard drinks  ? Drug use: No  ? Sexual activity: Not on file  ?Other Topics Concern  ? Not on file  ?Social History Narrative  ? Financial assistance application initiated - further paperwork needed per Bonna Gains 02/25/2010  ? Wrked as Retail buyer, now retired.  ? 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  ? No alcohol, drug or regular exercise  ? Never smoked, but husband has smoked around her for yrs.  ?   ?   ?   ? ?Social Determinants of  Health  ? ?Financial Resource Strain: Not on file  ?Food Insecurity: Not on file  ?Transportation Needs: Not on file  ?Physical Activity: Not on file  ?Stress: Not on file  ?Social Connections: Not on file  ?Intimate Partner Violence: Not on file  ? ? ?Review of Systems: ?ROS negative except for what is noted on the assessment and plan. ? ?Vitals:  ? 03/16/22 0919  ?BP: 121/67  ?Pulse: 88  ?Temp: 98.4 ?F (36.9 ?C)  ?TempSrc: Oral  ?SpO2: 100%  ?Weight: 183 lb 6.4 oz (83.2 kg)  ?Height: 5\' 4"  (1.626 m)  ? ? ? ?Physical Exam: ?Constitutional: well-appearing elderly woman sitting in the chair, in no acute distress ?HENT: normocephalic atraumatic, mucous membranes moist ?Eyes: conjunctiva non-erythematous ?Neck: supple ?Cardiovascular: regular rate and rhythm, no m/r/g ?Pulmonary/Chest: normal work of breathing on room air, lungs clear to auscultation bilaterally ?Abdominal: soft, non-tender, non-distended ?MSK: normal bulk and tone ?Neurological: alert & oriented x 3, 5/5 strength in bilateral upper and lower extremities, normal gait ?Skin: warm and dry ?Psych: Normal behavior, pleasant and calm ? ? ?Assessment & Plan:  ? ?See Encounters Tab for problem based charting. ? ?Patient discussed with Dr. Jimmye Norman ?Timothy Lasso, M.D. ?[facility], PGY-1 ?Pager: [phone], Phone: [phone] ?Date 03/16/2022 Time 10:55 AM  ?

## 2022-03-16 NOTE — Assessment & Plan Note (Addendum)
Prior office visit, patient was having diarrhea related to metformin use.  Since adjusting medications diarrhea has since resolved.  As a result of her diarrhea, she was noted to have low magnesium levels.  She was prescribed magnesium supplements which she is no longer taking and denies any symptoms at this time.  Denies N/V ,fatigue, weakness, numbness, tingling, muscle spasms or loss of appetite. ? ? ?P: ?-Check magnesium level ?

## 2022-03-16 NOTE — Assessment & Plan Note (Addendum)
Recent change in regimen due to GI upset; patient started on metformin XR 500mg  daily in replace of metformin 500mg  BID. Patient denies recurrence of diarrhea since taking metformin XR. Her insurance, [employer] does house calls and a foot exam was obtained a few weeks ago, no abnormalities noted. She is scheduled for eye exam 10/2022 with [facility] for her yearly exam.  A1c obtained today was 7.1%.  Ideally, given her age and comorbidities, would like A1c to be less than 8% is appropriate. ? ? ?P: ?-Continue current regimen ?

## 2022-03-16 NOTE — Assessment & Plan Note (Addendum)
BP at goal today, 121/67; pt is complaint with medication. Denies any complaints. ? ?P: continue regimen ?

## 2022-03-17 LAB — MAGNESIUM: Magnesium: 1.6 mg/dL (ref 1.6–2.3)

## 2022-03-19 NOTE — Progress Notes (Signed)
Internal Medicine Clinic Attending ? ?Case discussed with Dr. Ariwodo  At the time of the visit.  We reviewed the resident?s history and exam and pertinent patient test results.  I agree with the assessment, diagnosis, and plan of care documented in the resident?s note.  ?

## 2022-06-15 ENCOUNTER — Other Ambulatory Visit: Payer: Self-pay

## 2022-06-15 DIAGNOSIS — I10 Essential (primary) hypertension: Secondary | ICD-10-CM

## 2022-06-15 MED ORDER — SPIRONOLACTONE 25 MG PO TABS: 25.0000 mg | ORAL_TABLET | Freq: Every day | ORAL | 3 refills | Status: DC

## 2022-06-15 MED ORDER — OLMESARTAN MEDOXOMIL-HCTZ 40-25 MG PO TABS: 1.0000 | ORAL_TABLET | Freq: Every day | ORAL | 3 refills | Status: DC

## 2022-08-17 ENCOUNTER — Ambulatory Visit (INDEPENDENT_AMBULATORY_CARE_PROVIDER_SITE_OTHER): Payer: Medicare Other | Admitting: Internal Medicine

## 2022-08-17 ENCOUNTER — Encounter: Payer: Self-pay | Admitting: Internal Medicine

## 2022-08-17 ENCOUNTER — Other Ambulatory Visit: Payer: Self-pay

## 2022-08-17 VITALS — BP 114/61 | HR 74 | Temp 98.0°F | Ht 64.0 in | Wt 182.2 lb

## 2022-08-17 DIAGNOSIS — Z0000 Encounter for general adult medical examination without abnormal findings: Secondary | ICD-10-CM

## 2022-08-17 DIAGNOSIS — E7800 Pure hypercholesterolemia, unspecified: Secondary | ICD-10-CM

## 2022-08-17 DIAGNOSIS — I10 Essential (primary) hypertension: Secondary | ICD-10-CM

## 2022-08-17 DIAGNOSIS — E119 Type 2 diabetes mellitus without complications: Secondary | ICD-10-CM | POA: Diagnosis not present

## 2022-08-17 DIAGNOSIS — Z1211 Encounter for screening for malignant neoplasm of colon: Secondary | ICD-10-CM

## 2022-08-17 DIAGNOSIS — R4589 Other symptoms and signs involving emotional state: Secondary | ICD-10-CM

## 2022-08-17 DIAGNOSIS — Z7984 Long term (current) use of oral hypoglycemic drugs: Secondary | ICD-10-CM

## 2022-08-17 DIAGNOSIS — R7989 Other specified abnormal findings of blood chemistry: Secondary | ICD-10-CM

## 2022-08-17 DIAGNOSIS — Z23 Encounter for immunization: Secondary | ICD-10-CM

## 2022-08-17 DIAGNOSIS — Z1231 Encounter for screening mammogram for malignant neoplasm of breast: Secondary | ICD-10-CM

## 2022-08-17 LAB — GLUCOSE, CAPILLARY: Glucose-Capillary: 100 mg/dL — ABNORMAL HIGH (ref 70–99)

## 2022-08-17 LAB — POCT GLYCOSYLATED HEMOGLOBIN (HGB A1C): Hemoglobin A1C: 6.8 % — AB (ref 4.0–5.6)

## 2022-08-17 NOTE — Assessment & Plan Note (Signed)
Discussed that given her age, we can consider stopping mammography for breast cancer screening. We discussed risks and benefits of continued screening as well as uncertainty of data regarding benefit. She would like to continue annual screening at this time.  Plan -Referral for mammogram -Discuss continued screening yearly

## 2022-08-17 NOTE — Patient Instructions (Addendum)
It was wonderful to meet you today Sarah Branch!  Great job with your diabetes and blood pressure. Keep taking all of your medications.  Please contact your pharmacy about scheduling the COVID booster, shingles (Shingrix), and tetanus (Tdap) vaccines!

## 2022-08-17 NOTE — Assessment & Plan Note (Signed)
PHQ-9 originally 22 today, however on evaluation [name] reports she is not depressed and rarely feels down or sad. RN reviewed the PHQ-9 again with further instruction and score of 8. There were some concerns from RN regarding understanding of the questions and possible memory deficits. [name] declines any issues with ADLs and no memory concerns. She does live alone but has good family support from children and her sisters. I discussed referral to geriatrics assessment clinic with [name] today for geriatric assessment, memory testing, gait assessment, and other benefits for one time evaluation, however she declined.  Plan -Continue to f/u mood at next appointment -Consider [facility]

## 2022-08-17 NOTE — Assessment & Plan Note (Signed)
Repeat lipid panel today. Pravastatin 40 mg daily.

## 2022-08-17 NOTE — Assessment & Plan Note (Addendum)
Well controlled on metformin 500 mg bid. Continue current regimen. UTD on eye and foot exam. Remains on pravastatin. Normal kidney function and uACR 60 11/2021.  Plan -Metformin XR 500 bid -F/u in 6 months for repeat A1c

## 2022-08-17 NOTE — Assessment & Plan Note (Signed)
Previously completed FIT testing for CRC screening. Discussed previous preference to avoid colonoscopy and need for one if her testing returned positive. Sarah Branch does not want to do a CSY even in this case, therefore we will not continue screening with FIT. No family history of CRC reported.

## 2022-08-17 NOTE — Progress Notes (Signed)
Established Patient Office Visit  Subjective   Patient ID: Sarah Branch, female    DOB: 05-04-1946  Age: 76 y.o. MRN: [id number]  Chief Complaint  Patient presents with   Follow-up    MED REFILL    Sarah Branch returns to clinic today for follow-up of her chronic medical conditions including hypertension and well-controlled type 2 DM as well as health maintenance and first visit with me. She has not acute concerns today. Please see problem-based charting for further details.    Patient Active Problem List   Diagnosis Date Noted   Lower extremity edema 09/09/2021   Depressed mood 11/06/2020   Healthcare maintenance 08/13/2020   HFmrEF (heart failure with mildly reduced ejection fraction) 07/23/2019   Need for hepatitis C screening test 06/07/2019   Irregular cardiac rhythm 06/07/2019   Need for influenza vaccination 08/25/2018   Atrial arrhythmia 07/28/2017   Sinus pause 05/06/2016   Breast cancer screening 07/19/2014   Diabetes mellitus without complication (HCC) 02/28/2010   Hyperlipidemia 11/06/2006   Hypercalcemia 11/06/2006   Essential hypertension 11/06/2006      Review of Systems  Respiratory:  Negative for shortness of breath.   Cardiovascular:  Negative for chest pain.  Musculoskeletal:  Negative for falls.  Neurological:  Negative for dizziness and loss of consciousness.  Psychiatric/Behavioral:  Negative for depression and memory loss.       Objective:     BP 114/61 (BP Location: Right Arm, Patient Position: Sitting, Cuff Size: Normal)   Pulse 74   Temp 98 F (36.7 C) (Oral)   Ht 5\' 4"  (1.626 m)   Wt 182 lb 3.2 oz (82.6 kg)   LMP 01/14/1995   SpO2 100%   BMI 31.27 kg/m  BP Readings from Last 3 Encounters:  08/17/22 114/61  03/16/22 121/67  12/08/21 122/80   Wt Readings from Last 3 Encounters:  08/17/22 182 lb 3.2 oz (82.6 kg)  03/16/22 183 lb 6.4 oz (83.2 kg)  12/08/21 178 lb 6.4 oz (80.9 kg)    Physical Exam Vitals reviewed.   Constitutional:      General: She is not in acute distress.    Appearance: Normal appearance. She is not ill-appearing.  Pulmonary:     Effort: Pulmonary effort is normal.  Musculoskeletal:        General: Normal range of motion.  Neurological:     General: No focal deficit present.     Mental Status: She is alert.     Gait: Gait normal.  Psychiatric:        Mood and Affect: Mood normal.        Behavior: Behavior normal.      Results for orders placed or performed in visit on 08/17/22  Glucose, capillary  Result Value Ref Range   Glucose-Capillary 100 (H) 70 - 99 mg/dL  POC Hbg 10/17/22  Result Value Ref Range   Hemoglobin A1C 6.8 (A) 4.0 - 5.6 %   HbA1c POC (<> result, manual entry)     HbA1c, POC (prediabetic range)     HbA1c, POC (controlled diabetic range)        Assessment & Plan:   Problem List Items Addressed This Visit       Cardiovascular and Mediastinum   Essential hypertension (Chronic)    Well controlled on current regimen. Sarah Branch is adherent to her medications and brought them for review today.  Plan -Continue current regimen of carvedilol 12.5 mg bid, olmesartan-hctz 40-12.5 mg daily, spironolactone 25 mg  daily -BMP      Relevant Orders   BMP8+Anion Gap     Endocrine   Diabetes mellitus without complication ([place]) - Primary (Chronic)    Well controlled on metformin 500 mg bid. Continue current regimen. UTD on eye and foot exam. Remains on pravastatin. Normal kidney function and uACR 60 11/2021.  Plan -Metformin XR 500 bid -F/u in 6 months for repeat A1c      Relevant Orders   POC Hbg A1C (Completed)     Other   RESOLVED: Colon cancer screening (Chronic)    Previously completed FIT testing for CRC screening. Discussed previous preference to avoid colonoscopy and need for one if her testing returned positive. Sarah Branch does not want to do a CSY even in this case, therefore we will not continue screening with FIT. No family history of CRC  reported.      Hyperlipidemia (Chronic)    Repeat lipid panel today. Pravastatin 40 mg daily.      Relevant Orders   Lipid Profile   Healthcare maintenance (Chronic)   Breast cancer screening    Discussed that given her age, we can consider stopping mammography for breast cancer screening. We discussed risks and benefits of continued screening as well as uncertainty of data regarding benefit. She would like to continue annual screening at this time.  Plan -Referral for mammogram -Discuss continued screening yearly      Relevant Orders   MM Digital Screening   Depressed mood    PHQ-9 originally 22 today, however on evaluation Sarah Branch reports she is not depressed and rarely feels down or sad. RN reviewed the PHQ-9 again with further instruction and score of 8. There were some concerns from RN regarding understanding of the questions and possible memory deficits. Sarah Branch declines any issues with ADLs and no memory concerns. She does live alone but has good family support from children and her sisters. I discussed referral to geriatrics assessment clinic with Sarah Branch today for geriatric assessment, memory testing, gait assessment, and other benefits for one time evaluation, however she declined.  Plan -Continue to f/u mood at next appointment -Consider [facility]      Other Visit Diagnoses     Need for immunization against influenza       Relevant Orders   Flu Vaccine QUAD 6mo+IM (Fluarix, Fluzone & Alfiuria Quad PF) (Completed)       Return in about 6 months (around 02/16/2023) for f/u.    Charise Killian, MD

## 2022-08-17 NOTE — Assessment & Plan Note (Signed)
Well controlled on current regimen. Sarah Branch is adherent to her medications and brought them for review today.  Plan -Continue current regimen of carvedilol 12.5 mg bid, olmesartan-hctz 40-12.5 mg daily, spironolactone 25 mg daily -BMP

## 2022-08-18 LAB — LIPID PANEL
Chol/HDL Ratio: 3.4 ratio (ref 0.0–4.4)
Cholesterol, Total: 141 mg/dL (ref 100–199)
HDL: 42 mg/dL (ref >39)
LDL Chol Calc (NIH): 81 mg/dL (ref 0–99)
Triglycerides: 95 mg/dL (ref 0–149)
VLDL Cholesterol Cal: 18 mg/dL (ref 5–40)

## 2022-08-18 LAB — BMP8+ANION GAP
Anion Gap: 13 mmol/L (ref 10.0–18.0)
BUN/Creatinine Ratio: 18 (ref 12–28)
BUN: 26 mg/dL (ref 8–27)
CO2: 21 mmol/L (ref 20–29)
Calcium: 10.6 mg/dL — ABNORMAL HIGH (ref 8.7–10.3)
Chloride: 101 mmol/L (ref 96–106)
Creatinine, Ser: 1.48 mg/dL — ABNORMAL HIGH (ref 0.57–1.00)
Glucose: 111 mg/dL — ABNORMAL HIGH (ref 70–99)
Potassium: 4.7 mmol/L (ref 3.5–5.2)
Sodium: 135 mmol/L (ref 134–144)
eGFR: 36 mL/min/{1.73_m2} — ABNORMAL LOW (ref >59)

## 2022-08-19 NOTE — Progress Notes (Signed)
Lipids well controlled on current therapy.

## 2022-08-24 NOTE — Progress Notes (Signed)
Able to reach Sarah Branch regarding lab results. Encouraged increased hydration as she is only drinking about 20 oz of water/day. Will recheck in two weeks.

## 2022-08-24 NOTE — Addendum Note (Signed)
Addended by: Charise Killian on: 08/24/2022 09:29 AM   Modules accepted: Orders

## 2022-09-07 ENCOUNTER — Other Ambulatory Visit (INDEPENDENT_AMBULATORY_CARE_PROVIDER_SITE_OTHER): Payer: Medicare Other

## 2022-09-07 DIAGNOSIS — R7989 Other specified abnormal findings of blood chemistry: Secondary | ICD-10-CM

## 2022-09-08 LAB — BMP8+ANION GAP
Anion Gap: 17 mmol/L (ref 10.0–18.0)
BUN/Creatinine Ratio: 15 (ref 12–28)
BUN: 20 mg/dL (ref 8–27)
CO2: 23 mmol/L (ref 20–29)
Calcium: 10.8 mg/dL — ABNORMAL HIGH (ref 8.7–10.3)
Chloride: 98 mmol/L (ref 96–106)
Creatinine, Ser: 1.35 mg/dL — ABNORMAL HIGH (ref 0.57–1.00)
Glucose: 71 mg/dL (ref 70–99)
Potassium: 4 mmol/L (ref 3.5–5.2)
Sodium: 138 mmol/L (ref 134–144)
eGFR: 41 mL/min/{1.73_m2} — ABNORMAL LOW (ref >59)

## 2022-09-10 ENCOUNTER — Telehealth: Payer: Self-pay | Admitting: Internal Medicine

## 2022-09-10 MED ORDER — OLMESARTAN MEDOXOMIL 40 MG PO TABS: 40.0000 mg | ORAL_TABLET | Freq: Every day | ORAL | 11 refills | Status: DC

## 2022-09-10 NOTE — Telephone Encounter (Signed)
Called Sarah Branch 10/26 to review repeat lab results. Continue to show elevated creatinine, possible CKD3b in the setting of longstanding diabetes and hypertension. However, given mild hypercalcemia we will discontinue hydrochlorothiazide and continue olmesartan 40 mg daily. Will schedule f/u 11/27 at 11:15 for BP and lab check.   Notably, Sarah Branch has had intermittent mild hypercalcemia since at least 2012. In July 2020, while on hydrochlorothiazide, she had a Ca of 11.3 with PTH 20. Hctz was stopped at that time, however she continued to have mildly increased calcium in Feb and May 2021. In December 2021 and October 2022, calcium was within normal limits and she was resumed on hctz 08/2021. BP has been well controlled from 11/2021 until now. Also of note, on chart review there is mention in a note of negative SPEP/UPEP/PTH in 1997 although I am not able to see these results in the record.

## 2022-09-10 NOTE — Addendum Note (Signed)
Addended by: Charise Killian on: 09/10/2022 11:49 AM   Modules accepted: Orders

## 2022-10-12 ENCOUNTER — Ambulatory Visit (INDEPENDENT_AMBULATORY_CARE_PROVIDER_SITE_OTHER): Payer: Medicare Other | Admitting: Internal Medicine

## 2022-10-12 ENCOUNTER — Encounter: Payer: Self-pay | Admitting: Internal Medicine

## 2022-10-12 ENCOUNTER — Other Ambulatory Visit: Payer: Self-pay

## 2022-10-12 VITALS — BP 164/80 | HR 72 | Temp 98.1°F | Ht 64.0 in | Wt 187.0 lb

## 2022-10-12 DIAGNOSIS — I110 Hypertensive heart disease with heart failure: Secondary | ICD-10-CM

## 2022-10-12 DIAGNOSIS — M25512 Pain in left shoulder: Secondary | ICD-10-CM | POA: Diagnosis not present

## 2022-10-12 DIAGNOSIS — I5020 Unspecified systolic (congestive) heart failure: Secondary | ICD-10-CM

## 2022-10-12 DIAGNOSIS — I10 Essential (primary) hypertension: Secondary | ICD-10-CM

## 2022-10-12 DIAGNOSIS — R569 Unspecified convulsions: Secondary | ICD-10-CM | POA: Insufficient documentation

## 2022-10-12 HISTORY — DX: Pain in left shoulder: M25.512

## 2022-10-12 MED ORDER — AMLODIPINE BESYLATE 5 MG PO TABS: 5.0000 mg | ORAL_TABLET | Freq: Every day | ORAL | 3 refills | Status: DC

## 2022-10-12 MED ORDER — CARVEDILOL 12.5 MG PO TABS: 12.5000 mg | ORAL_TABLET | Freq: Two times a day (BID) | ORAL | 3 refills | Status: DC

## 2022-10-12 MED ORDER — OLMESARTAN MEDOXOMIL 40 MG PO TABS: 40.0000 mg | ORAL_TABLET | Freq: Every day | ORAL | 3 refills | Status: DC

## 2022-10-12 NOTE — Assessment & Plan Note (Addendum)
Blood pressure elevated today. Sarah Branch is having shoulder pain today, however I am concerned that this is not the only cause and pressures are likely less well controlled after stopping hydrochlorothiazide following last visit 08/17/22. As previously mentioned, hctz was discontinued  due to persistent hypercalcemia. Per telephone note 09/10/22, "Notably, [name] has had intermittent mild hypercalcemia since at least 2012. In July 2020, while on hydrochlorothiazide, she had a Ca of 11.3 with PTH 20. Hctz was stopped at that time, however she continued to have mildly increased calcium in Feb and May 2021. In December 2021 and October 2022, calcium was within normal limits and she was resumed on hctz 08/2021. BP has been well controlled from 11/2021 until now. Also of note, on chart review there is mention in a note of negative SPEP/UPEP/PTH in 1997 although I am not able to see these results in the record." We have discussed adding back amlodipine 5 mg. Previously [name] did tolerate higher doses of amlodipine due to lower extremity swelling but we hope to avoid this dose effect with the lower doses.  Plan -Add amlodipine 5 mg daily -Continue olmesartan 40 mg, spironolactone 25 mg, carvedilol 12.5 bid -BMP -F/u one month

## 2022-10-12 NOTE — Progress Notes (Signed)
Established Patient Office Visit  Subjective   Patient ID: Sarah Branch, female    DOB: [birth year]  Age: 76 y.o. MRN: [id number]  Chief Complaint  Patient presents with   Follow-up   Left shoulder pain   Sarah Branch is in clinic today for blood pressure and lab follow-up. She also has left shoulder pain today. She was last seen by me 08/17/22. Please see assessment/plan in problem-based charting for further details of today's visit.     Patient Active Problem List   Diagnosis Date Noted   New onset seizure (HCC) 10/12/2022   Left shoulder pain 10/12/2022   Lower extremity edema 09/09/2021   Depressed mood 11/06/2020   Healthcare maintenance 08/13/2020   HFmrEF (heart failure with mildly reduced ejection fraction) 07/23/2019   Irregular cardiac rhythm 06/07/2019   Atrial arrhythmia 07/28/2017   Sinus pause 05/06/2016   Breast cancer screening 07/19/2014   Diabetes mellitus without complication (HCC) 02/28/2010   Hyperlipidemia 11/06/2006   Hypercalcemia 11/06/2006   Essential hypertension 11/06/2006     Objective:     BP (!) 164/80 (BP Location: Right Arm, Patient Position: Sitting, Cuff Size: Normal)   Pulse 72   Temp 98.1 F (36.7 C) (Oral)   Ht 5\' 4"  (1.626 m)   Wt 187 lb (84.8 kg)   LMP 01/14/1995   SpO2 98% Comment: RA  BMI 32.10 kg/m  BP Readings from Last 3 Encounters:  10/12/22 (!) 164/80  08/17/22 114/61  03/16/22 121/67   Wt Readings from Last 3 Encounters:  10/12/22 187 lb (84.8 kg)  08/17/22 182 lb 3.2 oz (82.6 kg)  03/16/22 183 lb 6.4 oz (83.2 kg)   Physical Exam Vitals reviewed.  Constitutional:      General: She is not in acute distress.    Appearance: Normal appearance. She is not ill-appearing.  Musculoskeletal:     Comments: Left shoulder with full active and passive ROM. TTP over posterior shoulder. Full strength with resisted forward flexion, internal and external rotation. Pain with resisted forward flexion. Negative Hawkins.   Skin:    General: Skin is warm and dry.  Neurological:     Mental Status: She is alert.  Psychiatric:        Mood and Affect: Mood normal.        Behavior: Behavior normal.    Assessment & Plan:   Problem List Items Addressed This Visit       Cardiovascular and Mediastinum   Essential hypertension - Primary (Chronic)    Blood pressure elevated today. Sarah Branch is having shoulder pain today, however I am concerned that this is not the only cause and pressures are likely less well controlled after stopping hydrochlorothiazide following last visit 08/17/22. As previously mentioned, hctz was discontinued  due to persistent hypercalcemia. Per telephone note 09/10/22, "Notably, Sarah Branch has had intermittent mild hypercalcemia since at least 2012. In July 2020, while on hydrochlorothiazide, she had a Ca of 11.3 with PTH 20. Hctz was stopped at that time, however she continued to have mildly increased calcium in Feb and May 2021. In December 2021 and October 2022, calcium was within normal limits and she was resumed on hctz 08/2021. BP has been well controlled from 11/2021 until now. Also of note, on chart review there is mention in a note of negative SPEP/UPEP/PTH in 1997 although I am not able to see these results in the record." We have discussed adding back amlodipine 5 mg. Previously Sarah Branch did tolerate higher doses  of amlodipine due to lower extremity swelling but we hope to avoid this dose effect with the lower doses.  Plan -Add amlodipine 5 mg daily -Continue olmesartan 40 mg, spironolactone 25 mg, carvedilol 12.5 bid -BMP -F/u one month         Relevant Medications   carvedilol (COREG) 12.5 MG tablet   amLODipine (NORVASC) 5 MG tablet   olmesartan (BENICAR) 40 MG tablet   Other Relevant Orders   BMP8+Anion Gap   HFmrEF (heart failure with mildly reduced ejection fraction) (Chronic)   Relevant Medications   carvedilol (COREG) 12.5 MG tablet   amLODipine (NORVASC) 5 MG  tablet   olmesartan (BENICAR) 40 MG tablet     Other   Hypercalcemia (Chronic)   Relevant Orders   BMP8+Anion Gap   Left shoulder pain    Sarah Branch has had off/on left shoulder pain that is now worsened at night when lying on her left side. She still has good ROM and strength. She is not limited in use. Pain is located over the posterior shoulder. Pain with resisted forward flexion. She has used rubbing alcohol to the area with relief. I suspect pain is secondary to rotator cuff tendinopathy, lower suspicion for tear. Possible that this is representative of arthritis as well. Recommended topical heat and diclofenac, as well as acetaminophen prn. I encouraged physical therapy but Sarah Branch would like to wait on this. If not improvement, we could consider steroid injection.        Return in about 4 weeks (around 11/09/2022) for BP f/u.    Dickie La, MD

## 2022-10-12 NOTE — Patient Instructions (Addendum)
For your shoulder, you may use heat, diclofenac (Voltaren) gel, or acetaminophen (Tylenol) as needed. You may take up to 3,000 mg of acetaminophen (Tylenol) each day, usually we recommend [id number] mg every 8 hours as needed. Please also consider physical therapy to keep the muscles strong and keep it moving!   You may pick up olmesartan at your pharmacy today or they will deliver it as soon as it is filled. We have added on amlodipine 5 mg for blood pressure.  Please contact your pharmacy about scheduling the shingles (Shingrix) and updated COVID-19 vaccines.

## 2022-10-12 NOTE — Assessment & Plan Note (Signed)
Sarah Branch has had off/on left shoulder pain that is now worsened at night when lying on her left side. She still has good ROM and strength. She is not limited in use. Pain is located over the posterior shoulder. Pain with resisted forward flexion. She has used rubbing alcohol to the area with relief. I suspect pain is secondary to rotator cuff tendinopathy, lower suspicion for tear. Possible that this is representative of arthritis as well. Recommended topical heat and diclofenac, as well as acetaminophen prn. I encouraged physical therapy but Ms. Giambra would like to wait on this. If not improvement, we could consider steroid injection.

## 2022-10-13 LAB — BMP8+ANION GAP
Anion Gap: 11 mmol/L (ref 10.0–18.0)
BUN/Creatinine Ratio: 11 — ABNORMAL LOW (ref 12–28)
BUN: 12 mg/dL (ref 8–27)
CO2: 25 mmol/L (ref 20–29)
Calcium: 10.1 mg/dL (ref 8.7–10.3)
Chloride: 103 mmol/L (ref 96–106)
Creatinine, Ser: 1.07 mg/dL — ABNORMAL HIGH (ref 0.57–1.00)
Glucose: 109 mg/dL — ABNORMAL HIGH (ref 70–99)
Potassium: 4.2 mmol/L (ref 3.5–5.2)
Sodium: 139 mmol/L (ref 134–144)
eGFR: 54 mL/min/{1.73_m2} — ABNORMAL LOW (ref >59)

## 2022-10-13 NOTE — Progress Notes (Signed)
Kidney function improved, calcium normalized off thiazide. Discussed with [name] 11/28. She has received olmesartan in the mail, waiting on amlodipine.

## 2022-10-22 ENCOUNTER — Ambulatory Visit
Admission: RE | Admit: 2022-10-22 | Discharge: 2022-10-22 | Disposition: A | Discharge status: Home / Self Care | Payer: Medicaid Other | Source: Ambulatory Visit | Attending: Internal Medicine | Admitting: Internal Medicine

## 2022-10-22 DIAGNOSIS — Z1231 Encounter for screening mammogram for malignant neoplasm of breast: Secondary | ICD-10-CM

## 2022-10-23 ENCOUNTER — Other Ambulatory Visit: Payer: Self-pay | Admitting: Internal Medicine

## 2022-10-23 DIAGNOSIS — R928 Other abnormal and inconclusive findings on diagnostic imaging of breast: Secondary | ICD-10-CM

## 2022-11-04 LAB — HM DIABETES EYE EXAM

## 2022-11-05 ENCOUNTER — Ambulatory Visit
Admission: RE | Admit: 2022-11-05 | Discharge: 2022-11-05 | Disposition: A | Discharge status: Home / Self Care | Payer: Medicaid Other | Source: Ambulatory Visit | Attending: Internal Medicine | Admitting: Internal Medicine

## 2022-11-05 DIAGNOSIS — R928 Other abnormal and inconclusive findings on diagnostic imaging of breast: Secondary | ICD-10-CM

## 2022-11-11 ENCOUNTER — Encounter: Payer: Self-pay | Admitting: Student

## 2022-11-11 ENCOUNTER — Ambulatory Visit (INDEPENDENT_AMBULATORY_CARE_PROVIDER_SITE_OTHER): Payer: Medicare Other | Admitting: Student

## 2022-11-11 VITALS — BP 117/67 | HR 71 | Temp 97.9°F | Ht 64.0 in | Wt 187.4 lb

## 2022-11-11 DIAGNOSIS — Z7984 Long term (current) use of oral hypoglycemic drugs: Secondary | ICD-10-CM | POA: Diagnosis not present

## 2022-11-11 DIAGNOSIS — I10 Essential (primary) hypertension: Secondary | ICD-10-CM

## 2022-11-11 DIAGNOSIS — E119 Type 2 diabetes mellitus without complications: Secondary | ICD-10-CM

## 2022-11-11 NOTE — Patient Instructions (Signed)
Sarah Branch,  It was a pleasure seeing you in the clinic today.   Your blood pressure looks much better today, keep up the good work! We discussed getting the Shingles vaccine. If you would like to, you can get it at any local pharmacy.  Please come back in 3 months for your next visit.  Please call our clinic at [phone] if you have any questions or concerns. The best time to call is Monday-Friday from 9am-4pm, but there is someone available 24/7 at the same number. If you need medication refills, please notify your pharmacy one week in advance and they will send [place] a request.   Thank you for letting us take part in your care. We look forward to seeing you next time!

## 2022-11-11 NOTE — Progress Notes (Signed)
Internal Medicine Clinic Attending  Case discussed with Dr. Jinwala  At the time of the visit.  We reviewed the resident's history and exam and pertinent patient test results.  I agree with the assessment, diagnosis, and plan of care documented in the resident's note.  

## 2022-11-11 NOTE — Assessment & Plan Note (Signed)
Blood pressure at goal. Currently on norvasc 5mg  daily, coreg 12.5mg  BID, olmesartan 40mg  daily, and spironolactone 25mg  daily. Continue current regimen.  Plan: -continue current medications -BP check at next visit

## 2022-11-11 NOTE — Assessment & Plan Note (Signed)
Well controlled on metformin 500mg  BID. Eye exam completed on 11/04/2022. Continue current regimen and plan to recheck A1c at next visit.  Plan: -continue metformin XR 500mg  BID -f/u in 3 months for repeat A1c

## 2022-11-11 NOTE — Progress Notes (Signed)
CC: f/u HTN  HPI:  [name] is a 76 y.o. female living with a history stated below and presents today for f/u HTN. Please see problem based assessment and plan for additional details.  Past Medical History:  Diagnosis Date   Anemia    -NOS- Positive stool guaiac cards, refuses colonoscopy   Colon cancer screening 04/05/2013   Dizziness 02/23/2018   Exposure to second hand smoke    Hypercalcemia    NI SPEP, UPEP, PTH   Hyperlipidemia    Hypertension    Hypokalemia    2nd diuretics   Proteinuria    Severe hypertension 07/22/2019   Shoulder pain    Work Related   Superficial thrombophlebitis of left leg 12/27/2019    Current Outpatient Medications on File Prior to Visit  Medication Sig Dispense Refill   amLODipine (NORVASC) 5 MG tablet Take 1 tablet (5 mg total) by mouth daily. 90 tablet 3   carvedilol (COREG) 12.5 MG tablet Take 1 tablet (12.5 mg total) by mouth 2 (two) times daily with a meal. 180 tablet 3   loratadine (EQ LORATADINE) 10 MG tablet Take 1 tablet (10 mg total) by mouth daily. 90 tablet 3   metFORMIN (GLUCOPHAGE-XR) 500 MG 24 hr tablet Take 1 tablet (500 mg total) by mouth 2 (two) times daily with a meal. 180 tablet 3   olmesartan (BENICAR) 40 MG tablet Take 1 tablet (40 mg total) by mouth daily. 90 tablet 3   pravastatin (PRAVACHOL) 40 MG tablet Take 1 tablet (40 mg total) by mouth daily. 90 tablet 3   spironolactone (ALDACTONE) 25 MG tablet Take 1 tablet (25 mg total) by mouth daily. 90 tablet 3   No current facility-administered medications on file prior to visit.    Family History  Problem Relation Age of Onset   Cancer Mother    Vision loss Father     Social History   Socioeconomic History   Marital status: Married    Spouse name: Not on file   Number of children: Not on file   Years of education: Not on file   Highest education level: Not on file  Occupational History   Not on file  Tobacco Use   Smoking status: Never    Smokeless tobacco: Never   Tobacco comments:    Husband smoked around for years  Substance and Sexual Activity   Alcohol use: No    Alcohol/week: 0.0 standard drinks of alcohol   Drug use: No   Sexual activity: Not on file  Other Topics Concern   Not on file  Social History Narrative   Financial assistance application initiated - further paperwork needed per [name] 02/25/2010   Wrked as Copy, now retired.   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   No alcohol, drug or regular exercise   Never smoked, but husband has smoked around her for yrs.            Social Determinants of Health   Financial Resource Strain: Low Risk  (10/12/2022)   Overall Financial Resource Strain (CARDIA)    Difficulty of Paying Living Expenses: Not hard at all  Food Insecurity: No Food Insecurity (10/12/2022)   Hunger Vital Sign    Worried About Running Out of Food in the Last Year: Never true    Ran Out of Food in the Last Year: Never true  Transportation Needs: No Transportation Needs (10/12/2022)  PRAPARE - Administrator, Civil Service (Medical): No    Lack of Transportation (Non-Medical): No  Physical Activity: Not on file  Stress: Not on file  Social Connections: Moderately Isolated (10/12/2022)   Social Connection and Isolation Panel [NHANES]    Frequency of Communication with Friends and Family: More than three times a week    Frequency of Social Gatherings with Friends and Family: Three times a week    Attends Religious Services: More than 4 times per year    Active Member of Clubs or Organizations: No    Attends Banker Meetings: Never    Marital Status: Widowed  Intimate Partner Violence: Not At Risk (10/12/2022)   Humiliation, Afraid, Rape, and Kick questionnaire    Fear of Current or Ex-Partner: No    Emotionally Abused: No    Physically Abused: No    Sexually Abused: No    Review of Systems: ROS  negative except for what is noted on the assessment and plan.  Vitals:   11/11/22 0956  BP: 113/60  Pulse: 74  Temp: 97.9 F (36.6 C)  TempSrc: Oral  SpO2: 100%  Weight: 187 lb 6.4 oz (85 kg)  Height: 5\' 4"  (1.626 m)    Physical Exam: Physical Exam Constitutional:      Appearance: She is obese. She is not ill-appearing.  HENT:     Mouth/Throat:     Mouth: Mucous membranes are moist.     Pharynx: Oropharynx is clear. No oropharyngeal exudate.  Eyes:     Extraocular Movements: Extraocular movements intact.     Pupils: Pupils are equal, round, and reactive to light.  Cardiovascular:     Rate and Rhythm: Normal rate and regular rhythm.     Pulses: Normal pulses.     Heart sounds: Normal heart sounds. No murmur heard. Pulmonary:     Effort: Pulmonary effort is normal.     Breath sounds: Normal breath sounds.  Abdominal:     General: Bowel sounds are normal. There is no distension.     Palpations: Abdomen is soft.     Tenderness: There is no abdominal tenderness.  Musculoskeletal:        General: No swelling.  Skin:    General: Skin is warm and dry.  Neurological:     General: No focal deficit present.     Mental Status: She is alert and oriented to person, place, and time.  Psychiatric:        Mood and Affect: Mood normal.        Behavior: Behavior normal.      Assessment & Plan:   Please see problem based assessment and plan for additional details.   Patient discussed with Dr. , MD [facility] Internal Medicine, PGY-3 Phone: [phone] Date 11/11/2022 Time 10:04 AM

## 2022-12-01 ENCOUNTER — Other Ambulatory Visit: Payer: Self-pay | Admitting: Internal Medicine

## 2022-12-01 DIAGNOSIS — N6310 Unspecified lump in the right breast, unspecified quadrant: Secondary | ICD-10-CM

## 2022-12-07 ENCOUNTER — Other Ambulatory Visit: Payer: Self-pay | Admitting: Internal Medicine

## 2022-12-07 DIAGNOSIS — E7800 Pure hypercholesterolemia, unspecified: Secondary | ICD-10-CM

## 2022-12-30 ENCOUNTER — Encounter: Payer: Self-pay | Admitting: Dietician

## 2023-02-03 ENCOUNTER — Other Ambulatory Visit: Payer: Self-pay | Admitting: Student

## 2023-02-03 NOTE — Telephone Encounter (Signed)
Next appt scheduled 3/25 with PCP. 

## 2023-02-08 ENCOUNTER — Other Ambulatory Visit: Payer: Self-pay

## 2023-02-08 ENCOUNTER — Encounter: Payer: Self-pay | Admitting: Internal Medicine

## 2023-02-08 ENCOUNTER — Ambulatory Visit (INDEPENDENT_AMBULATORY_CARE_PROVIDER_SITE_OTHER): Payer: 59 | Admitting: Internal Medicine

## 2023-02-08 ENCOUNTER — Ambulatory Visit (INDEPENDENT_AMBULATORY_CARE_PROVIDER_SITE_OTHER): Payer: 59

## 2023-02-08 VITALS — BP 135/58 | HR 78 | Temp 98.1°F | Ht 64.0 in | Wt 189.6 lb

## 2023-02-08 DIAGNOSIS — N183 Chronic kidney disease, stage 3 unspecified: Secondary | ICD-10-CM

## 2023-02-08 DIAGNOSIS — I130 Hypertensive heart and chronic kidney disease with heart failure and stage 1 through stage 4 chronic kidney disease, or unspecified chronic kidney disease: Secondary | ICD-10-CM

## 2023-02-08 DIAGNOSIS — E1122 Type 2 diabetes mellitus with diabetic chronic kidney disease: Secondary | ICD-10-CM | POA: Diagnosis not present

## 2023-02-08 DIAGNOSIS — Z0000 Encounter for general adult medical examination without abnormal findings: Secondary | ICD-10-CM

## 2023-02-08 DIAGNOSIS — R4589 Other symptoms and signs involving emotional state: Secondary | ICD-10-CM

## 2023-02-08 DIAGNOSIS — Z7984 Long term (current) use of oral hypoglycemic drugs: Secondary | ICD-10-CM

## 2023-02-08 DIAGNOSIS — I10 Essential (primary) hypertension: Secondary | ICD-10-CM

## 2023-02-08 DIAGNOSIS — I5020 Unspecified systolic (congestive) heart failure: Secondary | ICD-10-CM

## 2023-02-08 DIAGNOSIS — E119 Type 2 diabetes mellitus without complications: Secondary | ICD-10-CM

## 2023-02-08 LAB — POCT GLYCOSYLATED HEMOGLOBIN (HGB A1C): Hemoglobin A1C: 6.8 % — AB (ref 4.0–5.6)

## 2023-02-08 LAB — GLUCOSE, CAPILLARY: Glucose-Capillary: 96 mg/dL (ref 70–99)

## 2023-02-08 NOTE — Progress Notes (Signed)
Subjective:   Sarah Branch is a 77 y.o. female who presents for an Initial Medicare Annual Wellness Visit. I connected with  Wyatt Portela on 02/08/23 by a  Face-To-Face encounter  and verified that I am speaking with the correct person using two identifiers.  Patient Location: Other:  Office/Clinic  Provider Location: Office/Clinic  I discussed the limitations of evaluation and management by telemedicine. The patient expressed understanding and agreed to proceed.  Review of Systems    Defer to PCP       Objective:    Today's Vitals   02/08/23 1403  BP: (!) 135/58  Pulse: 78  Temp: 98.1 F (36.7 C)  SpO2: 100%  Weight: 189 lb 9.6 oz (86 kg)  Height: 5\' 4"  (1.626 m)   Body mass index is 32.54 kg/m.     02/08/2023    2:05 PM 02/08/2023   10:05 AM 10/12/2022    9:55 AM 08/17/2022    9:05 AM 03/16/2022    9:19 AM 12/08/2021    9:46 AM 11/24/2021    9:01 AM  Advanced Directives  Does Patient Have a Medical Advance Directive? No No No No No No No  Would patient like information on creating a medical advance directive? No - Patient declined No - Patient declined No - Patient declined No - Patient declined No - Patient declined No - Patient declined No - Patient declined    Current Medications (verified) Outpatient Encounter Medications as of 02/08/2023  Medication Sig   amLODipine (NORVASC) 5 MG tablet Take 1 tablet (5 mg total) by mouth daily.   carvedilol (COREG) 12.5 MG tablet Take 1 tablet (12.5 mg total) by mouth 2 (two) times daily with a meal.   loratadine (EQ LORATADINE) 10 MG tablet Take 1 tablet (10 mg total) by mouth daily.   metFORMIN (GLUCOPHAGE-XR) 500 MG 24 hr tablet TAKE 1 TABLET BY MOUTH TWICE A DAY WITH MEALS *MED CHANGE*   olmesartan (BENICAR) 40 MG tablet Take 1 tablet (40 mg total) by mouth daily.   pravastatin (PRAVACHOL) 40 MG tablet TAKE 1 TABLET BY MOUTH DAILY   spironolactone (ALDACTONE) 25 MG tablet Take 1 tablet (25 mg total) by mouth  daily.   No facility-administered encounter medications on file as of 02/08/2023.    Allergies (verified) Lisinopril   History: Past Medical History:  Diagnosis Date   Anemia    -NOS- Positive stool guaiac cards, refuses colonoscopy   Colon cancer screening 04/05/2013   Dizziness 02/23/2018   Exposure to second hand smoke    Hypercalcemia    NI SPEP, UPEP, PTH   Hyperlipidemia    Hypertension    Hypokalemia    2nd diuretics   Proteinuria    Severe hypertension 07/22/2019   Shoulder pain    Work Related   Superficial thrombophlebitis of left leg 12/27/2019   History reviewed. No pertinent surgical history. Family History  Problem Relation Age of Onset   Cancer Mother    Vision loss Father    Social History   Socioeconomic History   Marital status: Married    Spouse name: Not on file   Number of children: Not on file   Years of education: Not on file   Highest education level: Not on file  Occupational History   Not on file  Tobacco Use   Smoking status: Never   Smokeless tobacco: Never   Tobacco comments:    Husband smoked around for years  Substance and Sexual Activity  Alcohol use: No    Alcohol/week: 0.0 standard drinks of alcohol   Drug use: No   Sexual activity: Not on file  Other Topics Concern   Not on file  Social History Narrative   Financial assistance application initiated - further paperwork needed per Bonna Gains 02/25/2010   Wrked as Retail buyer, now retired.   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   No alcohol, drug or regular exercise   Never smoked, but husband has smoked around her for yrs.            Social Determinants of Health   Financial Resource Strain: Low Risk  (02/08/2023)   Overall Financial Resource Strain (CARDIA)    Difficulty of Paying Living Expenses: Not hard at all  Food Insecurity: No Food Insecurity (02/08/2023)   Hunger Vital Sign    Worried About Running Out of Food  in the Last Year: Never true    Ran Out of Food in the Last Year: Never true  Transportation Needs: No Transportation Needs (02/08/2023)   PRAPARE - Hydrologist (Medical): No    Lack of Transportation (Non-Medical): No  Physical Activity: Inactive (02/08/2023)   Exercise Vital Sign    Days of Exercise per Week: 0 days    Minutes of Exercise per Session: 0 min  Stress: No Stress Concern Present (02/08/2023)   [place]    Feeling of Stress : Not at all  Social Connections: [place] (02/08/2023)   Social Connection and Isolation Panel [NHANES]    Frequency of Communication with Friends and Family: More than three times a week    Frequency of Social Gatherings with Friends and Family: More than three times a week    Attends Religious Services: More than 4 times per year    Active Member of [employer] or Organizations: Yes    Attends Music therapist: More than 4 times per year    Marital Status: Married    Tobacco Counseling Counseling given: Not Answered Tobacco comments: Husband smoked around for years   Clinical Intake:  Pre-visit preparation completed: Yes  Pain : No/denies pain     Nutritional Risks: None Diabetes: Yes CBG done?: Yes CBG resulted in Enter/ Edit results?: Yes Did pt. bring in CBG monitor from home?: No  How often do you need to have someone help you when you read instructions, pamphlets, or other written materials from your doctor or pharmacy?: 1 - Never What is the last grade level you completed in school?: 12th grade  Diabetic?Nutrition Risk Assessment:  Has the patient had any N/V/D within the last 2 months?  No  Does the patient have any non-healing wounds?  No  Has the patient had any unintentional weight loss or weight gain?  No   Diabetes:  Is the patient diabetic?  Yes  If diabetic, was a CBG obtained today?  Yes  Did the  patient bring in their glucometer from home?  No    Financial Strains and Diabetes Management:  Are you having any financial strains with the device, your supplies or your medication? No .  Does the patient want to be seen by Chronic Care Management for management of their diabetes?  No  Would the patient like to be referred to a Nutritionist or for Diabetic Management?  No   Diabetic Exams:  Diabetic Eye Exam: Completed  11/04/2022 Diabetic Foot Exam: Completed 03/16/2022    Interpreter Needed?: No  Information entered by :: Amal Renbarger,cma   Activities of Daily Living    02/08/2023    2:04 PM 02/08/2023   10:04 AM  In your present state of health, do you have any difficulty performing the following activities:  Hearing? 0 0  Vision? 0 0  Difficulty concentrating or making decisions? 0 0  Walking or climbing stairs? 1 1  Dressing or bathing? 0 0  Doing errands, shopping? 0 0    Patient Care Team: Charise Killian, MD as PCP - General (Internal Medicine)  Indicate any recent Medical Services you may have received from other than [facility] providers in the past year (date may be approximate).     Assessment:   This is a routine wellness examination for [place].  Hearing/Vision screen No results found.  Dietary issues and exercise activities discussed:     Goals Addressed   None   Depression Screen    02/08/2023    2:04 PM 02/08/2023   10:04 AM 10/12/2022    9:53 AM 08/17/2022    9:57 AM 03/16/2022    9:19 AM 12/08/2021    9:45 AM 11/24/2021    9:09 AM  PHQ 2/9 Scores  PHQ - 2 Score 0 0 1 2 0 0 0  PHQ- 9 Score   2 8 0 0 0    Fall Risk    02/08/2023    2:04 PM 02/08/2023   10:04 AM 10/12/2022    9:52 AM 08/17/2022    9:03 AM 03/16/2022    9:18 AM  Fall Risk   Falls in the past year? 0 0 0 0 0  Number falls in past yr: 0 0 0 0 0  Injury with Fall? 0 0 0 0 0  Risk for fall due to : No Fall Risks No Fall Risks No Fall Risks No Fall Risks No Fall Risks  Follow up Falls  evaluation completed;Falls prevention discussed Falls evaluation completed;Falls prevention discussed Falls evaluation completed;Falls prevention discussed Falls evaluation completed Falls evaluation completed;Falls prevention discussed    FALL RISK PREVENTION PERTAINING TO THE HOME:  Any stairs in or around the home? No  If so, are there any without handrails? No  Home free of loose throw rugs in walkways, pet beds, electrical cords, etc? No  Adequate lighting in your home to reduce risk of falls? Yes   ASSISTIVE DEVICES UTILIZED TO PREVENT FALLS:  Life alert? No  Use of a cane, walker or w/c? No  Grab bars in the bathroom? Yes  Shower chair or bench in shower? Yes  Elevated toilet seat or a handicapped toilet? No   TIMED UP AND GO:  Was the test performed? No .  Length of time to ambulate 10 feet: 0 sec.   Gait slow and steady without use of assistive device  Cognitive Function:        Immunizations Immunization History  Administered Date(s) Administered   Fluad Quad(high Dose 65+) 09/09/2021   Influenza,inj,Quad PF,6+ Mos 08/17/2022   PFIZER(Purple Top)SARS-COV-2 Vaccination 06/28/2020, 07/19/2020   PNEUMOCOCCAL CONJUGATE-20 10/15/2021    TDAP status: Due, Education has been provided regarding the importance of this vaccine. Advised may receive this vaccine at local pharmacy or Health Dept. Aware to provide a copy of the vaccination record if obtained from local pharmacy or Health Dept. Verbalized acceptance and understanding.  Flu Vaccine status: Up to date  Pneumococcal vaccine status: Up to date  Covid-19 vaccine status: Completed vaccines  Qualifies for Shingles Vaccine? No   Zostavax completed No   Shingrix Completed?: No.    Education has been provided regarding the importance of this vaccine. Patient has been advised to call insurance company to determine out of pocket expense if they have not yet received this vaccine. Advised may also receive vaccine at  local pharmacy or Health Dept. Verbalized acceptance and understanding.  Screening Tests Health Maintenance  Topic Date Due   DTaP/Tdap/Td (1 - Tdap) Never done   Zoster Vaccines- Shingrix (1 of 2) Never done   COVID-19 Vaccine (3 - 2023-24 season) 07/17/2022   Diabetic kidney evaluation - Urine ACR  11/24/2022   FOOT EXAM  03/17/2023   HEMOGLOBIN A1C  08/11/2023   LIPID PANEL  08/18/2023   Diabetic kidney evaluation - eGFR measurement  10/13/2023   OPHTHALMOLOGY EXAM  11/05/2023   Medicare Annual Wellness (AWV)  02/08/2024   Pneumonia Vaccine 61+ Years old  Completed   INFLUENZA VACCINE  Completed   DEXA SCAN  Completed   Hepatitis C Screening  Completed   HPV VACCINES  Aged Out   COLONOSCOPY (Pts 45-74yrs Insurance coverage will need to be confirmed)  Discontinued    Health Maintenance  Health Maintenance Due  Topic Date Due   DTaP/Tdap/Td (1 - Tdap) Never done   Zoster Vaccines- Shingrix (1 of 2) Never done   COVID-19 Vaccine (3 - 2023-24 season) 07/17/2022   Diabetic kidney evaluation - Urine ACR  11/24/2022    Colorectal cancer screening: Type of screening: Colonoscopy. Completed 10/30/2011. Repeat every 0 years    Lung Cancer Screening: (Low Dose CT Chest recommended if Age 55-80 years, 30 pack-year currently smoking OR have quit w/in 15years.) does not qualify.   Lung Cancer Screening Referral: N/A  Additional Screening:  Hepatitis C Screening: does not qualify; Completed 06/07/2019  Vision Screening: Recommended annual ophthalmology exams for early detection of glaucoma and other disorders of the eye. Is the patient up to date with their annual eye exam?  Yes  Who is the provider or what is the name of the office in which the patient attends annual eye exams? Dr.Groat If pt is not established with a provider, would they like to be referred to a provider to establish care? No .   Dental Screening: Recommended annual dental exams for proper oral  hygiene  Community Resource Referral / Chronic Care Management: CRR required this visit?  No   CCM required this visit?  No      Plan:     I have personally reviewed and noted the following in the patient's chart:   Medical and social history Use of alcohol, tobacco or illicit drugs  Current medications and supplements including opioid prescriptions. Patient is not currently taking opioid prescriptions. Functional ability and status Nutritional status Physical activity Advanced directives List of other physicians Hospitalizations, surgeries, and ER visits in previous 12 months Vitals Screenings to include cognitive, depression, and falls Referrals and appointments  In addition, I have reviewed and discussed with patient certain preventive protocols, quality metrics, and best practice recommendations. A written personalized care plan for preventive services as well as general preventive health recommendations were provided to patient.     Kerin Perna, [facility]   02/08/2023   Nurse Notes: Face-To-Face Visit  Ms. Delcastillo , Thank you for taking time to come for your Medicare Wellness Visit. I appreciate your ongoing commitment to your health goals. Please review the following plan we  discussed and let me know if I can assist you in the future.   These are the goals we discussed:  Goals   None     This is a list of the screening recommended for you and due dates:  Health Maintenance  Topic Date Due   DTaP/Tdap/Td vaccine (1 - Tdap) Never done   Zoster (Shingles) Vaccine (1 of 2) Never done   COVID-19 Vaccine (3 - 2023-24 season) 07/17/2022   Yearly kidney health urinalysis for diabetes  11/24/2022   Complete foot exam   03/17/2023   Hemoglobin A1C  08/11/2023   Lipid (cholesterol) test  08/18/2023   Yearly kidney function blood test for diabetes  10/13/2023   Eye exam for diabetics  11/05/2023   Medicare Annual Wellness Visit  02/08/2024   Pneumonia Vaccine  Completed    Flu Shot  Completed   DEXA scan (bone density measurement)  Completed   Hepatitis C Screening: USPSTF Recommendation to screen - Ages 35-79 yo.  Completed   HPV Vaccine  Aged Out   Colon Cancer Screening  Discontinued

## 2023-02-08 NOTE — Patient Instructions (Addendum)
It was wonderful to see you today!  Please contact your pharmacy about scheduling the shingles (Shingrix), tetanus (Tdap), and updated COVID-19 vaccines.

## 2023-02-09 ENCOUNTER — Encounter: Payer: Self-pay | Admitting: Internal Medicine

## 2023-02-09 DIAGNOSIS — N183 Chronic kidney disease, stage 3 unspecified: Secondary | ICD-10-CM | POA: Insufficient documentation

## 2023-02-09 LAB — BMP8+ANION GAP
Anion Gap: 15 mmol/L (ref 10.0–18.0)
BUN/Creatinine Ratio: 14 (ref 12–28)
BUN: 15 mg/dL (ref 8–27)
CO2: 24 mmol/L (ref 20–29)
Calcium: 10.3 mg/dL (ref 8.7–10.3)
Chloride: 100 mmol/L (ref 96–106)
Creatinine, Ser: 1.09 mg/dL — ABNORMAL HIGH (ref 0.57–1.00)
Glucose: 101 mg/dL — ABNORMAL HIGH (ref 70–99)
Potassium: 4.7 mmol/L (ref 3.5–5.2)
Sodium: 139 mmol/L (ref 134–144)
eGFR: 52 mL/min/{1.73_m2} — ABNORMAL LOW (ref >59)

## 2023-02-09 LAB — MICROALBUMIN / CREATININE URINE RATIO
Creatinine, Urine: 100.5 mg/dL
Microalb/Creat Ratio: 9 mg/g creat (ref 0–29)
Microalbumin, Urine: 9.5 ug/mL

## 2023-02-09 NOTE — Assessment & Plan Note (Signed)
We discussed obtaining shingles, Tdap, and COVID update at pharmacy. She is considering.

## 2023-02-09 NOTE — Assessment & Plan Note (Signed)
GFR within CKD range for the past 5 months, likely consistent with CKD stage 3a and due to longstanding hypertension and DM. Improved from GFR 36 in October > 52 today. Continue appropriate BP and DM management. Will discuss addition of SGLT2i for this and HF.  Plan -BMP today -Continue ARB, discuss SGLT2i

## 2023-02-09 NOTE — Progress Notes (Signed)
Established Patient Office Visit  Subjective   Patient ID: Sarah Branch, female    DOB: [birth year]  Age: 77 y.o. MRN: [id number]  Chief Complaint  Patient presents with   Follow-up    Sarah Branch returns today for follow-up of chronic conditions. Please see assessment/plan in problem-based charting for further details of today's visit.     Patient Active Problem List   Diagnosis Date Noted   Stage 3 chronic kidney disease ([place]) 02/09/2023   Left shoulder pain 10/12/2022   Lower extremity edema 09/09/2021   Healthcare maintenance 08/13/2020   HFmrEF (heart failure with mildly reduced ejection fraction) 07/23/2019   Irregular cardiac rhythm 06/07/2019   Atrial arrhythmia 07/28/2017   Sinus pause 05/06/2016   Breast cancer screening 07/19/2014   Diabetes mellitus without complication ([place]) [id number]   Hyperlipidemia 11/06/2006   Essential hypertension 11/06/2006      Objective:     BP (!) 135/58 (BP Location: Right Arm, Patient Position: Sitting, Cuff Size: Normal)   Pulse 78   Temp 98.1 F (36.7 C) (Oral)   Ht 5\' 4"  (1.626 m)   Wt 189 lb 9.6 oz (86 kg)   LMP 01/14/1995   SpO2 100% Comment: RA  BMI 32.54 kg/m  BP Readings from Last 3 Encounters:  02/08/23 (!) 135/58  02/08/23 (!) 135/58  11/11/22 117/67   Wt Readings from Last 3 Encounters:  02/08/23 189 lb 9.6 oz (86 kg)  02/08/23 189 lb 9.6 oz (86 kg)  11/11/22 187 lb 6.4 oz (85 kg)     Physical Exam Constitutional:      General: She is not in acute distress.    Appearance: Normal appearance.  Pulmonary:     Effort: Pulmonary effort is normal.  Neurological:     General: No focal deficit present.     Mental Status: She is alert.  Psychiatric:        Mood and Affect: Mood normal.        Behavior: Behavior normal.       Assessment & Plan:   Problem List Items Addressed This Visit       Cardiovascular and Mediastinum   Essential hypertension (Chronic)    Blood pressure near goal  today. Will not make any changes to current regimen.  Plan -Continue amlodipine 5 mg, carvedilol 12.5 mg bid, olmesartan 40 mg, and spironolactone 25 mg daily -BMP today      HFmrEF (heart failure with mildly reduced ejection fraction) (Chronic)    Chronic, stable. No SOB, other symptoms, or evidence of volume overload. Continues on ARB, BB, and MRA. We may discuss SGLT2i at future visit.        Endocrine   Diabetes mellitus without complication ([place]) - Primary (Chronic)    A1c 6.8% today, within goal. Urine ACR obtained today. Foot and eye exam UTD. Remains on moderate intensity statin.  Plan -Continue metformin XR 500 bid -F/u 3 months      Relevant Orders   POC Hbg A1C (Completed)     Genitourinary   Stage 3 chronic kidney disease (HCC)    GFR within CKD range for the past 5 months, likely consistent with CKD stage 3a and due to longstanding hypertension and DM. Improved from GFR 36 in October > 52 today. Continue appropriate BP and DM management. Will discuss addition of SGLT2i for this and HF.  Plan -BMP today -Continue ARB, discuss SGLT2i      Relevant Orders   BMP8+Anion Gap (Completed)  Microalbumin / Creatinine Urine Ratio     Other   Healthcare maintenance (Chronic)    We discussed obtaining shingles, Tdap, and COVID update at pharmacy. She is considering.      RESOLVED: Depressed mood    Sarah Branch denies any depression or other mood symptoms. PHQ2 0 today. Will resolve.       Return in about 3 months (around 05/11/2023) for fu.    Charise Killian, MD

## 2023-02-09 NOTE — Assessment & Plan Note (Signed)
Blood pressure near goal today. Will not make any changes to current regimen.  Plan -Continue amlodipine 5 mg, carvedilol 12.5 mg bid, olmesartan 40 mg, and spironolactone 25 mg daily -BMP today

## 2023-02-09 NOTE — Assessment & Plan Note (Addendum)
A1c 6.8% today, within goal. Urine ACR obtained today. Foot and eye exam UTD. Remains on moderate intensity statin.  Plan -Continue metformin XR 500 bid -F/u 3 months

## 2023-02-09 NOTE — Assessment & Plan Note (Signed)
Sarah Branch denies any depression or other mood symptoms. PHQ2 0 today. Will resolve.

## 2023-02-09 NOTE — Assessment & Plan Note (Signed)
Chronic, stable. No SOB, other symptoms, or evidence of volume overload. Continues on ARB, BB, and MRA. We may discuss SGLT2i at future visit.

## 2023-02-10 ENCOUNTER — Ambulatory Visit
Admission: RE | Admit: 2023-02-10 | Discharge: 2023-02-10 | Disposition: A | Discharge status: Home / Self Care | Payer: Medicaid Other | Source: Ambulatory Visit | Attending: Internal Medicine | Admitting: Internal Medicine

## 2023-02-10 ENCOUNTER — Ambulatory Visit
Admission: RE | Admit: 2023-02-10 | Discharge: 2023-02-10 | Disposition: A | Discharge status: Home / Self Care | Payer: 59 | Source: Ambulatory Visit | Attending: Internal Medicine | Admitting: Internal Medicine

## 2023-02-10 DIAGNOSIS — N6310 Unspecified lump in the right breast, unspecified quadrant: Secondary | ICD-10-CM

## 2023-02-10 NOTE — Progress Notes (Signed)
Stable CKD3a, no albuminuria. Attempted to call Sarah Branch 02/10/23, unable to reach.

## 2023-02-10 NOTE — Progress Notes (Signed)
Reviewed with Ms. Hopple.

## 2023-05-10 ENCOUNTER — Other Ambulatory Visit: Payer: Self-pay

## 2023-05-10 ENCOUNTER — Encounter: Payer: Self-pay | Admitting: Internal Medicine

## 2023-05-10 ENCOUNTER — Ambulatory Visit (INDEPENDENT_AMBULATORY_CARE_PROVIDER_SITE_OTHER): Payer: 59 | Admitting: Internal Medicine

## 2023-05-10 VITALS — BP 135/73 | HR 73 | Temp 98.0°F | Ht 64.0 in | Wt 192.7 lb

## 2023-05-10 DIAGNOSIS — N1831 Chronic kidney disease, stage 3a: Secondary | ICD-10-CM

## 2023-05-10 DIAGNOSIS — E1122 Type 2 diabetes mellitus with diabetic chronic kidney disease: Secondary | ICD-10-CM

## 2023-05-10 DIAGNOSIS — Z7984 Long term (current) use of oral hypoglycemic drugs: Secondary | ICD-10-CM

## 2023-05-10 DIAGNOSIS — E1121 Type 2 diabetes mellitus with diabetic nephropathy: Secondary | ICD-10-CM

## 2023-05-10 DIAGNOSIS — I5020 Unspecified systolic (congestive) heart failure: Secondary | ICD-10-CM

## 2023-05-10 LAB — POCT GLYCOSYLATED HEMOGLOBIN (HGB A1C): Hemoglobin A1C: 7 % — AB (ref 4.0–5.6)

## 2023-05-10 LAB — GLUCOSE, CAPILLARY: Glucose-Capillary: 102 mg/dL — ABNORMAL HIGH (ref 70–99)

## 2023-05-10 MED ORDER — EMPAGLIFLOZIN 10 MG PO TABS: 10.0000 mg | ORAL_TABLET | Freq: Every day | ORAL | 11 refills | Status: DC

## 2023-05-10 NOTE — Progress Notes (Signed)
   Established Patient Office Visit  Subjective   Patient ID: Ether Goebel, female    DOB: 02/03/1946  Age: 77 y.o. MRN: [id number]  Chief Complaint  Patient presents with   Follow-up    Ms. Carboni presents to clinic today for follow-up of chronic medical conditions. No acute concerns. Please see assessment/plan in problem-based charting for further details of today's visit.    Review of Systems  Respiratory:  Negative for shortness of breath.   Cardiovascular:  Negative for chest pain.     Objective:     BP 135/73 (BP Location: Right Arm, Patient Position: Sitting, Cuff Size: Normal)   Pulse 73   Temp 98 F (36.7 C) (Oral)   Ht 5\' 4"  (1.626 m)   Wt 192 lb 11.2 oz (87.4 kg)   LMP 01/14/1995   SpO2 100% Comment: RA  BMI 33.08 kg/m  BP Readings from Last 3 Encounters:  05/10/23 135/73  02/08/23 (!) 135/58  02/08/23 (!) 135/58   Wt Readings from Last 3 Encounters:  05/10/23 192 lb 11.2 oz (87.4 kg)  02/08/23 189 lb 9.6 oz (86 kg)  02/08/23 189 lb 9.6 oz (86 kg)      Physical Exam Vitals reviewed.  Constitutional:      General: She is not in acute distress.    Appearance: Normal appearance. She is not ill-appearing.  Pulmonary:     Effort: Pulmonary effort is normal.  Musculoskeletal:        General: No swelling. Normal range of motion.  Neurological:     General: No focal deficit present.     Mental Status: She is alert.     Gait: Gait normal.  Psychiatric:        Mood and Affect: Mood normal.        Behavior: Behavior normal.       Assessment & Plan:   Problem List Items Addressed This Visit       Cardiovascular and Mediastinum   HFmrEF (heart failure with mildly reduced ejection fraction) (Chronic)    Chronic and stable. Asymptomatic. Continue ARB, BB, MRA. Start SGLT2i today.        Endocrine   Diabetes mellitus with nephropathy (HCC) - Primary    A1c mildly increased to 7.0%. Continues on metformin XR 500 bid. I discussed the  addition of SGLT2i with Ms. Speltz today given history of CKD3a and HFmrEF. She is not interested in additional medication but would be okay with trying empagliflozin instead of metformin. We discussed side effects and need to stay well hydrated. Foot exam updated today.  Plan -Stop metformin XR 500 mg bid, start empagliflozin 10 mg daily -Return for labs only in 1 month, BMP -Return in 3 months for A1c      Relevant Medications   empagliflozin (JARDIANCE) 10 MG TABS tablet   Other Relevant Orders   POC Hbg A1C (Completed)   Glucose, capillary (Completed)     Genitourinary   Stage 3 chronic kidney disease (HCC)    CKD3a, likely from longstanding hypertension and DM. Urine ACR wnl at last visit. BMP today with addition of SGLT2i. Continue BP and DM management.  Plan -BMP -Continue ARB, start SGLT2i       Relevant Medications   empagliflozin (JARDIANCE) 10 MG TABS tablet   Other Relevant Orders   BMP8+Anion Gap   BMP8+Anion Gap    Return in about 3 months (around 08/10/2023) for fu DM.    Dickie La, MD

## 2023-05-10 NOTE — Patient Instructions (Addendum)
It was wonderful to see you today!  Start taking empagliflozin (Jardiance) 10 mg (1 tablet) daily for diabetes, kidney disease, and heart failure. You may STOP taking metformin.

## 2023-05-10 NOTE — Assessment & Plan Note (Signed)
Chronic and stable. Asymptomatic. Continue ARB, BB, MRA. Start SGLT2i today.

## 2023-05-10 NOTE — Assessment & Plan Note (Signed)
CKD3a, likely from longstanding hypertension and DM. Urine ACR wnl at last visit. BMP today with addition of SGLT2i. Continue BP and DM management.  Plan -BMP -Continue ARB, start SGLT2i

## 2023-05-10 NOTE — Assessment & Plan Note (Signed)
A1c mildly increased to 7.0%. Continues on metformin XR 500 bid. I discussed the addition of SGLT2i with Sarah Branch today given history of CKD3a and HFmrEF. She is not interested in additional medication but would be okay with trying empagliflozin instead of metformin. We discussed side effects and need to stay well hydrated. Foot exam updated today.  Plan -Stop metformin XR 500 mg bid, start empagliflozin 10 mg daily -Return for labs only in 1 month, BMP -Return in 3 months for A1c

## 2023-05-11 LAB — BMP8+ANION GAP
Anion Gap: 13 mmol/L (ref 10.0–18.0)
BUN/Creatinine Ratio: 17 (ref 12–28)
BUN: 20 mg/dL (ref 8–27)
CO2: 23 mmol/L (ref 20–29)
Calcium: 10.3 mg/dL (ref 8.7–10.3)
Chloride: 102 mmol/L (ref 96–106)
Creatinine, Ser: 1.17 mg/dL — ABNORMAL HIGH (ref 0.57–1.00)
Glucose: 105 mg/dL — ABNORMAL HIGH (ref 70–99)
Potassium: 4.2 mmol/L (ref 3.5–5.2)
Sodium: 138 mmol/L (ref 134–144)
eGFR: 48 mL/min/{1.73_m2} — ABNORMAL LOW (ref >59)

## 2023-05-11 NOTE — Progress Notes (Signed)
Stable CKD3a. Reviewed with Sarah Branch 6/25. Reviewed the plan to start empagliflozin 10 mg daily, stop metformin. Return in one month for BMP, return in 3 months for A1c. No further questions.

## 2023-06-22 ENCOUNTER — Other Ambulatory Visit: Payer: Self-pay | Admitting: Internal Medicine

## 2023-06-22 DIAGNOSIS — I10 Essential (primary) hypertension: Secondary | ICD-10-CM

## 2023-07-13 ENCOUNTER — Other Ambulatory Visit (INDEPENDENT_AMBULATORY_CARE_PROVIDER_SITE_OTHER): Payer: 59

## 2023-07-13 DIAGNOSIS — N1831 Chronic kidney disease, stage 3a: Secondary | ICD-10-CM

## 2023-07-14 LAB — BMP8+ANION GAP
Anion Gap: 14 mmol/L (ref 10.0–18.0)
BUN/Creatinine Ratio: 12 (ref 12–28)
BUN: 15 mg/dL (ref 8–27)
CO2: 22 mmol/L (ref 20–29)
Calcium: 9.8 mg/dL (ref 8.7–10.3)
Chloride: 100 mmol/L (ref 96–106)
Creatinine, Ser: 1.27 mg/dL — ABNORMAL HIGH (ref 0.57–1.00)
Glucose: 199 mg/dL — ABNORMAL HIGH (ref 70–99)
Potassium: 4.3 mmol/L (ref 3.5–5.2)
Sodium: 136 mmol/L (ref 134–144)
eGFR: 44 mL/min/{1.73_m2} — ABNORMAL LOW (ref >59)

## 2023-07-14 NOTE — Progress Notes (Signed)
BMP stable. Reviewed with Ms. Fredricka Bonine 8/28. F/u scheduled 9/24.

## 2023-08-10 ENCOUNTER — Encounter: Payer: Self-pay | Admitting: Internal Medicine

## 2023-08-10 ENCOUNTER — Ambulatory Visit: Payer: 59 | Admitting: Internal Medicine

## 2023-08-10 ENCOUNTER — Other Ambulatory Visit: Payer: Self-pay

## 2023-08-10 VITALS — BP 108/62 | HR 68 | Temp 98.0°F | Ht 64.0 in | Wt 192.3 lb

## 2023-08-10 DIAGNOSIS — N1831 Chronic kidney disease, stage 3a: Secondary | ICD-10-CM

## 2023-08-10 DIAGNOSIS — I130 Hypertensive heart and chronic kidney disease with heart failure and stage 1 through stage 4 chronic kidney disease, or unspecified chronic kidney disease: Secondary | ICD-10-CM | POA: Diagnosis not present

## 2023-08-10 DIAGNOSIS — E1122 Type 2 diabetes mellitus with diabetic chronic kidney disease: Secondary | ICD-10-CM | POA: Diagnosis not present

## 2023-08-10 DIAGNOSIS — Z23 Encounter for immunization: Secondary | ICD-10-CM

## 2023-08-10 DIAGNOSIS — K5900 Constipation, unspecified: Secondary | ICD-10-CM

## 2023-08-10 DIAGNOSIS — I5020 Unspecified systolic (congestive) heart failure: Secondary | ICD-10-CM

## 2023-08-10 DIAGNOSIS — E1121 Type 2 diabetes mellitus with diabetic nephropathy: Secondary | ICD-10-CM

## 2023-08-10 DIAGNOSIS — I10 Essential (primary) hypertension: Secondary | ICD-10-CM

## 2023-08-10 HISTORY — DX: Constipation, unspecified: K59.00

## 2023-08-10 LAB — POCT GLYCOSYLATED HEMOGLOBIN (HGB A1C): Hemoglobin A1C: 8.5 % — AB (ref 4.0–5.6)

## 2023-08-10 LAB — GLUCOSE, CAPILLARY: Glucose-Capillary: 193 mg/dL — ABNORMAL HIGH (ref 70–99)

## 2023-08-10 MED ORDER — OLMESARTAN MEDOXOMIL 40 MG PO TABS: 40.0000 mg | ORAL_TABLET | Freq: Every day | ORAL | 3 refills | Status: DC

## 2023-08-10 MED ORDER — AMLODIPINE BESYLATE 5 MG PO TABS: 5.0000 mg | ORAL_TABLET | Freq: Every day | ORAL | 3 refills | Status: DC

## 2023-08-10 MED ORDER — CARVEDILOL 12.5 MG PO TABS: 12.5000 mg | ORAL_TABLET | Freq: Two times a day (BID) | ORAL | 3 refills | Status: DC

## 2023-08-10 NOTE — Progress Notes (Addendum)
Established Patient Office Visit  Subjective   Patient ID: Sarah Branch, female    DOB: [birth year]  Age: 77 y.o. MRN: [id number]  Chief Complaint  Patient presents with   Follow-up    Diabetes.    Sarah Branch returns today for follow-up of diabetes. Please see assessment/plan in problem-based charting for further details of today's visit.    Patient Active Problem List   Diagnosis Date Noted   Constipation 08/10/2023   Stage 3 chronic kidney disease (HCC) 02/09/2023   Left shoulder pain 10/12/2022   Lower extremity edema 09/09/2021   Healthcare maintenance 08/13/2020   HFmrEF (heart failure with mildly reduced ejection fraction) 07/23/2019   Irregular cardiac rhythm 06/07/2019   Atrial arrhythmia 07/28/2017   Sinus pause 05/06/2016   Breast cancer screening 07/19/2014   Diabetes mellitus with nephropathy (HCC) 02/28/2010   Hyperlipidemia 11/06/2006   Essential hypertension 11/06/2006      Objective:     BP 108/62 (BP Location: Right Arm, Patient Position: Sitting, Cuff Size: Normal)   Pulse 68   Temp 98 F (36.7 C) (Oral)   Ht 5\' 4"  (1.626 m)   Wt 192 lb 4.8 oz (87.2 kg)   LMP 01/14/1995   SpO2 100% Comment: RA  BMI 33.01 kg/m  BP Readings from Last 3 Encounters:  08/10/23 108/62  05/10/23 135/73  02/08/23 (!) 135/58   Wt Readings from Last 3 Encounters:  08/10/23 192 lb 4.8 oz (87.2 kg)  05/10/23 192 lb 11.2 oz (87.4 kg)  02/08/23 189 lb 9.6 oz (86 kg)    Physical Exam Constitutional:      General: She is not in acute distress.    Appearance: Normal appearance.  Cardiovascular:     Rate and Rhythm: Normal rate and regular rhythm.     Heart sounds: Normal heart sounds.  Pulmonary:     Effort: Pulmonary effort is normal.     Breath sounds: Normal breath sounds.  Musculoskeletal:        General: No swelling.  Neurological:     General: No focal deficit present.     Mental Status: She is alert.  Psychiatric:        Mood and Affect: Mood  normal.        Behavior: Behavior normal.        Thought Content: Thought content normal.       Assessment & Plan:   Problem List Items Addressed This Visit       Cardiovascular and Mediastinum   Essential hypertension (Chronic)    Blood pressure today is 108/62, lower than her previous readings. She is not having any dizziness, headaches, edema, SOB or fatigue.  She is taking carvedilol 12.5 mg bid, olmesartan 40 mg daily, spironolactone 25 mg daily, amlodipine 5 mg daily for HTN, CKD and HFmrEF. She takes her medications every day. She asked for refills .   - A/P - Chronic and well controlled  - Continue Carvedilol 12.5 mg bid, olmesartan 40 mg daily, spironolactone 25 mg daily, amlodipine 5 mg daily - Refill sent to pharmacy         Relevant Medications   amLODipine (NORVASC) 5 MG tablet   carvedilol (COREG) 12.5 MG tablet   olmesartan (BENICAR) 40 MG tablet   HFmrEF (heart failure with mildly reduced ejection fraction) (Chronic)    Chronic and stable. Asymptomatic. Continue ARB, BB, MRA. Continue SGLT2i.       Relevant Medications   amLODipine (NORVASC) 5 MG tablet  carvedilol (COREG) 12.5 MG tablet   olmesartan (BENICAR) 40 MG tablet     Endocrine   Diabetes mellitus with nephropathy (HCC) - Primary    Sarah Branch was started on Empagliflozin 10 mg once daily on 04/2023 and has been tolerating the medication well. No orthostatic sx, denies urinary sx or n/v. Her A1c today was 8.5%, increased from 7.0% 3 months ago. She reports she loves eating sweets and will have chocolate bars, honey buns, or ice creams. She admits she is not very active. Spoke to her about increasing dose of Empagliflozin from 10 mg to 25 mg. However, she was hesitant due to not wanting to be dependent on a lot of medications. Educated on importance of better blood glucose control for prevention for neuropathy, nephropathy and retinopathy. Also mentioned, we are not adding new tablets or meds, but  increasing the dosage. Pt asked for some time to think about it.  A/P - Diabetes not controlled; goal A1c <7.0 -  Call patient in a week to see if patient agrees to increase dose from 10 mg to 25 mg.     Addendum 10/4: I have been unable to reach Sarah Branch to discuss. Will continue to discuss at future visits. - Increase exercise and reduce processed sweets  - Continue 10 mg empagliflozin until dose change        Relevant Medications   olmesartan (BENICAR) 40 MG tablet   Other Relevant Orders   POC Hbg A1C (Completed)   Glucose, capillary (Completed)     Genitourinary   Stage 3 chronic kidney disease (HCC)    Htn well controlled. Diabetes not well controlled, see problem based A/P fro changes made for this problem. Denies edema, urinary sx or fatigue. Is on carvedilol 12.5 mg bid, olmesartan 40 mg daily, spironolactone 25 mg daily, amlodipine 5 mg daily. Last 3 BMP as follow.     Latest Ref Rng & Units 07/13/2023   10:02 AM 05/10/2023   11:04 AM 02/08/2023   10:46 AM  BMP  Glucose 70 - 99 mg/dL 829  562  130   BUN 8 - 27 mg/dL 15  20  15    Creatinine 0.57 - 1.00 mg/dL 8.65  7.84  6.96   BUN/Creat Ratio 12 - 28 12  17  14    Sodium 134 - 144 mmol/L 136  138  139   Potassium 3.5 - 5.2 mmol/L 4.3  4.2  4.7   Chloride 96 - 106 mmol/L 100  102  100   CO2 20 - 29 mmol/L 22  23  24    Calcium 8.7 - 10.3 mg/dL 9.8  29.5  28.4    A/P - continue DM and Htn management  - BMP on next visit           Other   Constipation    Sarah Branch reports of having constipation in the last 2-3 months. She denies any diarrhea, abdominal pain or bloody stool. No weight loss or diet changes. She has bowel movements almost everyday. She eats apple sauce which helps her if she is struggling. Reports she drinks 1 and a half bottles of water and does not eat a lot of fruits and vegetables. She gets help from sister to get water bottle cases and gets 24 pack bottles that would last her 1-2 weeks. She eats  raisin bran cereal and honey wheat breads. Encouraged her to get gallons/jugs of water so she does not have to ration out her water intake/bottles.  Assessment  - Constipation due to decreased water and fiber intake   Plan  - Add more fiber to diet with more vegetables and whole wheat - Increase water intake       Other Visit Diagnoses     Encounter for immunization       Relevant Orders   Flu Vaccine Trivalent High Dose (Fluad) (Completed)       Return in about 3 months (around 11/09/2023) for fu DM.    Dickie La, MD

## 2023-08-10 NOTE — Assessment & Plan Note (Signed)
Sarah Branch reports of having constipation in the last 2-3 months. She denies any diarrhea, abdominal pain or bloody stool. No weight loss or diet changes. She has bowel movements almost everyday. She eats apple sauce which helps her if she is struggling. Reports she drinks 1 and a half bottles of water and does not eat a lot of fruits and vegetables. She gets help from sister to get water bottle cases and gets 24 pack bottles that would last her 1-2 weeks. She eats raisin bran cereal and honey wheat breads. Encouraged her to get gallons/jugs of water so she does not have to ration out her water intake/bottles.   Assessment  - Constipation due to decreased water and fiber intake   Plan  - Add more fiber to diet with more vegetables and whole wheat - Increase water intake

## 2023-08-10 NOTE — Assessment & Plan Note (Signed)
Blood pressure today is 108/62, lower than her previous readings. She is not having any dizziness, headaches, edema, SOB or fatigue.  She is taking carvedilol 12.5 mg bid, olmesartan 40 mg daily, spironolactone 25 mg daily, amlodipine 5 mg daily for HTN, CKD and HFmrEF. She takes her medications every day. She asked for refills .   - A/P - Chronic and well controlled  - Continue Carvedilol 12.5 mg bid, olmesartan 40 mg daily, spironolactone 25 mg daily, amlodipine 5 mg daily - Refill sent to pharmacy

## 2023-08-10 NOTE — Assessment & Plan Note (Addendum)
Sarah Branch was started on Empagliflozin 10 mg once daily on 04/2023 and has been tolerating the medication well. No orthostatic sx, denies urinary sx or n/v. Her A1c today was 8.5%, increased from 7.0% 3 months ago. She reports she loves eating sweets and will have chocolate bars, honey buns, or ice creams. She admits she is not very active. Spoke to her about increasing dose of Empagliflozin from 10 mg to 25 mg. However, she was hesitant due to not wanting to be dependent on a lot of medications. Educated on importance of better blood glucose control for prevention for neuropathy, nephropathy and retinopathy. Also mentioned, we are not adding new tablets or meds, but increasing the dosage. Pt asked for some time to think about it.  A/P - Diabetes not controlled; goal A1c <7.0 -  Call patient in a week to see if patient agrees to increase dose from 10 mg to 25 mg.     Addendum 10/4: I have been unable to reach Sarah Branch to discuss. Will continue to discuss at future visits. - Increase exercise and reduce processed sweets  - Continue 10 mg empagliflozin until dose change

## 2023-08-10 NOTE — Assessment & Plan Note (Addendum)
Htn well controlled. Diabetes not well controlled, see problem based A/P fro changes made for this problem. Denies edema, urinary sx or fatigue. Is on carvedilol 12.5 mg bid, olmesartan 40 mg daily, spironolactone 25 mg daily, amlodipine 5 mg daily. Last 3 BMP as follow.     Latest Ref Rng & Units 07/13/2023   10:02 AM 05/10/2023   11:04 AM 02/08/2023   10:46 AM  BMP  Glucose 70 - 99 mg/dL 161  096  045   BUN 8 - 27 mg/dL 15  20  15    Creatinine 0.57 - 1.00 mg/dL 4.09  8.11  9.14   BUN/Creat Ratio 12 - 28 12  17  14    Sodium 134 - 144 mmol/L 136  138  139   Potassium 3.5 - 5.2 mmol/L 4.3  4.2  4.7   Chloride 96 - 106 mmol/L 100  102  100   CO2 20 - 29 mmol/L 22  23  24    Calcium 8.7 - 10.3 mg/dL 9.8  78.2  95.6    A/P - continue DM and Htn management  - BMP on next visit

## 2023-08-10 NOTE — Patient Instructions (Signed)
Thank you, Sarah Branch for allowing [place] to provide your care today. Today we discussed the following.  Diabetes - Your blood sugar level (A1c) has gone up to 8.5%, our goal is less than 7.0%. We highly recommend increasing your Jardiance from 10 mg to 25 mg for better blood glucose control. Dr. Sol Blazing will call in a week after you have some time to think about it.  Hypertension - Please continue taking your amlodipine, olmesartan, spironolactone and carvedilol everyday. We will also send refills of your prescription.  You received the flu vaccine today. We recommend getting the COVID vaccine at your apartment.   I have ordered the following labs for you:  Lab Orders         Glucose, capillary         POC Hbg A1C        Follow up: 3 months   Remember:   We look forward to seeing you next time. Please call our clinic at [phone] if you have any questions or concerns. The best time to call is Monday-Friday from 9am-4pm, but there is someone available 24/7. If after hours or the weekend, call the main hospital number and ask for the Internal Medicine Resident On-Call. If you need medication refills, please notify your pharmacy one week in advance and they will send [place] a request.   Thank you for trusting me with your care. Wishing you the best! [facility]

## 2023-08-10 NOTE — Assessment & Plan Note (Addendum)
Chronic and stable. Asymptomatic. Continue ARB, BB, MRA. Continue SGLT2i.

## 2023-11-02 ENCOUNTER — Ambulatory Visit (INDEPENDENT_AMBULATORY_CARE_PROVIDER_SITE_OTHER): Payer: 59 | Admitting: Internal Medicine

## 2023-11-02 VITALS — BP 133/65 | HR 61 | Temp 97.9°F | Ht 64.0 in | Wt 193.4 lb

## 2023-11-02 DIAGNOSIS — E1121 Type 2 diabetes mellitus with diabetic nephropathy: Secondary | ICD-10-CM | POA: Diagnosis not present

## 2023-11-02 DIAGNOSIS — E785 Hyperlipidemia, unspecified: Secondary | ICD-10-CM | POA: Diagnosis not present

## 2023-11-02 DIAGNOSIS — E7800 Pure hypercholesterolemia, unspecified: Secondary | ICD-10-CM

## 2023-11-02 DIAGNOSIS — I10 Essential (primary) hypertension: Secondary | ICD-10-CM

## 2023-11-02 LAB — GLUCOSE, CAPILLARY: Glucose-Capillary: 178 mg/dL — ABNORMAL HIGH (ref 70–99)

## 2023-11-02 LAB — POCT GLYCOSYLATED HEMOGLOBIN (HGB A1C): Hemoglobin A1C: 8.8 % — AB (ref 4.0–5.6)

## 2023-11-02 MED ORDER — METFORMIN HCL 500 MG PO TABS: ORAL_TABLET | ORAL | 3 refills | Status: AC

## 2023-11-02 MED ORDER — EMPAGLIFLOZIN 25 MG PO TABS: 25.0000 mg | ORAL_TABLET | Freq: Every day | ORAL | 3 refills | Status: DC

## 2023-11-02 NOTE — Assessment & Plan Note (Signed)
Blood pressure near goal today. No chest pain with exertion, no dyspnea, or fatigue with significant exertion. Mild lower extremity edema on exam, resolves with elevation. No changes today.  Plan -Continue carvedilol 12.5 mg bid, olmesartan 40 mg daily, spironolactone 25 mg daily, and amlodipine 5 mg daily -BMP

## 2023-11-02 NOTE — Progress Notes (Signed)
Established Patient Office Visit  Subjective   Patient ID: Sarah Branch, female    DOB: 1946/11/05  Age: 77 y.o. MRN: [id number]  Chief Complaint  Patient presents with   Follow-up   [name] returns today for follow-up of chronic medical conditions. Please see assessment/plan in problem-based charting for further details of today's visit.     Patient Active Problem List   Diagnosis Date Noted   Constipation 08/10/2023   Stage 3 chronic kidney disease (HCC) 02/09/2023   Left shoulder pain 10/12/2022   Lower extremity edema 09/09/2021   Healthcare maintenance 08/13/2020   HFmrEF (heart failure with mildly reduced ejection fraction) 07/23/2019   Irregular cardiac rhythm 06/07/2019   Atrial arrhythmia 07/28/2017   Sinus pause 05/06/2016   Breast cancer screening 07/19/2014   Diabetes mellitus with nephropathy (HCC) 02/28/2010   Hyperlipidemia 11/06/2006   Essential hypertension 11/06/2006     Objective:     BP 133/65 (BP Location: Right Arm, Patient Position: Sitting, Cuff Size: Normal)   Pulse 61   Temp 97.9 F (36.6 C) (Oral)   Ht 5\' 4"  (1.626 m)   Wt 193 lb 6.4 oz (87.7 kg)   LMP 01/14/1995   SpO2 100%   BMI 33.20 kg/m  BP Readings from Last 3 Encounters:  11/02/23 133/65  08/10/23 108/62  05/10/23 135/73   Wt Readings from Last 3 Encounters:  11/02/23 193 lb 6.4 oz (87.7 kg)  08/10/23 192 lb 4.8 oz (87.2 kg)  05/10/23 192 lb 11.2 oz (87.4 kg)    Physical Exam Constitutional:      General: She is not in acute distress.    Appearance: Normal appearance. She is not ill-appearing.  Cardiovascular:     Rate and Rhythm: Normal rate and regular rhythm.     Heart sounds: Normal heart sounds.  Pulmonary:     Effort: Pulmonary effort is normal.  Musculoskeletal:     Comments: Mild bilateral LEE  Neurological:     General: No focal deficit present.     Mental Status: She is alert.  Psychiatric:        Mood and Affect: Mood normal.         Behavior: Behavior normal.      Assessment & Plan:   Problem List Items Addressed This Visit       Cardiovascular and Mediastinum   Essential hypertension (Chronic)   Blood pressure near goal today. No chest pain with exertion, no dyspnea, or fatigue with significant exertion. Mild lower extremity edema on exam, resolves with elevation. No changes today.  Plan -Continue carvedilol 12.5 mg bid, olmesartan 40 mg daily, spironolactone 25 mg daily, and amlodipine 5 mg daily -BMP      Relevant Orders   BMP8+Anion Gap (Completed)     Endocrine   Diabetes mellitus with nephropathy (HCC) - Primary   A1c continues to creep up, now 8.8%. She declined increase in medications at last visit, however she is open today to addition of metformin and increase in Jardiance to max dose. We discussed lifestyle changes.   Plan -Not controlled, goal A1c <7% -Increase empagliflozin to 25 mg daily -Add metformin, titrate up to 1000 mg bid -Lifestyle modifications -F/u 3 months  Addendum 12/19 Called to discuss lab results with [name] and she expressed hesitation regarding her diabetes medications. She is not sure she wants to take two medications. We discussed the natural progression of diabetes and the additional benefits of SGLT2i for her history of CKD and  HF. She did well on metformin in the past so I don't expect adverse effects but can address if these develop. We did discuss that she may try lifestyle changes which may help avoid the need for further medication. I encouraged her to try both Jardiance and metformin, as well as discussed the risks of poorly controlled diabetes, however I am not sure she will take both medications. Plan to f/u at next visit or sooner if she has further concerns.      Relevant Medications   metFORMIN (GLUCOPHAGE) 500 MG tablet   empagliflozin (JARDIANCE) 25 MG TABS tablet   Other Relevant Orders   POC Hbg A1C (Completed)   Glucose, capillary (Completed)    BMP8+Anion Gap (Completed)     Other   Hyperlipidemia (Chronic)   Annual lipid screening today. Continues on pravastatin 40 mg.  Plan -Lipid panel      Relevant Orders   Lipid Profile (Completed)    Return in about 3 months (around 01/31/2024) for fu .    Dickie La, MD

## 2023-11-02 NOTE — Assessment & Plan Note (Signed)
Annual lipid screening today. Continues on pravastatin 40 mg.  Plan -Lipid panel

## 2023-11-02 NOTE — Assessment & Plan Note (Signed)
A1c continues to creep up, now 8.8%. She declined increase in medications at last visit, however she is open today to addition of metformin and increase in Jardiance to max dose. We discussed lifestyle changes.   Plan -Not controlled, goal A1c <7% -Increase empagliflozin to 25 mg daily -Add metformin, titrate up to 1000 mg bid -Lifestyle modifications -F/u 3 months  Addendum 12/19 Called to discuss lab results with Sarah Branch and she expressed hesitation regarding her diabetes medications. She is not sure she wants to take two medications. We discussed the natural progression of diabetes and the additional benefits of SGLT2i for her history of CKD and HF. She did well on metformin in the past so I don't expect adverse effects but can address if these develop. We did discuss that she may try lifestyle changes which may help avoid the need for further medication. I encouraged her to try both Jardiance and metformin, as well as discussed the risks of poorly controlled diabetes, however I am not sure she will take both medications. Plan to f/u at next visit or sooner if she has further concerns.

## 2023-11-02 NOTE — Patient Instructions (Addendum)
It was wonderful to see you today!  Start taking metformin for diabetes. Instructions below. Take 1 tablet by mouth daily for one week. Then take 1 tablet twice daily for one week. Then take 2 tablets in the morning and 1 tablet in the evening for one week. Then take 2 tablets twice daily.   Increase empagliflozin (Jardiance) to 25 mg after you finish your current bottle. I have sent a new prescription to your pharmacy.  Nutrition for Diabetes -Increase fiber (>40-45 grams/day): beans, fruits, vegetables, whole grains (look for the word "whole" wheat/grain on the ingredient list of your bread!) -Decrease: simple/processed carbs, saturated/trans fats   Please contact your pharmacy about scheduling the shingles (Shingrix) and tetanus (Tdap) vaccines.

## 2023-11-03 LAB — BMP8+ANION GAP
Anion Gap: 14 mmol/L (ref 10.0–18.0)
BUN/Creatinine Ratio: 19 (ref 12–28)
BUN: 26 mg/dL (ref 8–27)
CO2: 23 mmol/L (ref 20–29)
Calcium: 10.3 mg/dL (ref 8.7–10.3)
Chloride: 100 mmol/L (ref 96–106)
Creatinine, Ser: 1.39 mg/dL — ABNORMAL HIGH (ref 0.57–1.00)
Glucose: 144 mg/dL — ABNORMAL HIGH (ref 70–99)
Potassium: 4.4 mmol/L (ref 3.5–5.2)
Sodium: 137 mmol/L (ref 134–144)
eGFR: 39 mL/min/{1.73_m2} — ABNORMAL LOW (ref >59)

## 2023-11-03 LAB — LIPID PANEL
Chol/HDL Ratio: 3 {ratio} (ref 0.0–4.4)
Cholesterol, Total: 162 mg/dL (ref 100–199)
HDL: 54 mg/dL (ref >39)
LDL Chol Calc (NIH): 85 mg/dL (ref 0–99)
Triglycerides: 130 mg/dL (ref 0–149)
VLDL Cholesterol Cal: 23 mg/dL (ref 5–40)

## 2023-11-04 NOTE — Progress Notes (Addendum)
Reviewed lab results with [name] 12/19.

## 2023-11-13 ENCOUNTER — Encounter: Payer: Self-pay | Admitting: *Deleted

## 2023-11-23 LAB — HM DIABETES EYE EXAM

## 2023-12-03 ENCOUNTER — Other Ambulatory Visit: Payer: Self-pay | Admitting: Internal Medicine

## 2023-12-03 DIAGNOSIS — E7800 Pure hypercholesterolemia, unspecified: Secondary | ICD-10-CM

## 2023-12-03 NOTE — Telephone Encounter (Signed)
Medication sent to pharmacy  

## 2023-12-14 ENCOUNTER — Encounter: Payer: Self-pay | Admitting: Dietician

## 2023-12-22 ENCOUNTER — Other Ambulatory Visit: Payer: Self-pay | Admitting: Internal Medicine

## 2023-12-22 DIAGNOSIS — Z1231 Encounter for screening mammogram for malignant neoplasm of breast: Secondary | ICD-10-CM

## 2023-12-30 ENCOUNTER — Ambulatory Visit
Admission: RE | Admit: 2023-12-30 | Discharge: 2023-12-30 | Disposition: A | Discharge status: Home / Self Care | Payer: 59 | Source: Ambulatory Visit | Attending: Internal Medicine

## 2023-12-30 DIAGNOSIS — Z1231 Encounter for screening mammogram for malignant neoplasm of breast: Secondary | ICD-10-CM

## 2024-02-01 ENCOUNTER — Encounter: Payer: 59 | Admitting: Internal Medicine

## 2024-02-08 ENCOUNTER — Ambulatory Visit: Payer: 59 | Admitting: Internal Medicine

## 2024-02-08 ENCOUNTER — Encounter: Payer: Self-pay | Admitting: Internal Medicine

## 2024-02-08 VITALS — BP 126/62 | HR 66 | Temp 98.0°F | Ht 64.0 in | Wt 187.8 lb

## 2024-02-08 DIAGNOSIS — I10 Essential (primary) hypertension: Secondary | ICD-10-CM

## 2024-02-08 DIAGNOSIS — N1831 Chronic kidney disease, stage 3a: Secondary | ICD-10-CM

## 2024-02-08 DIAGNOSIS — E1121 Type 2 diabetes mellitus with diabetic nephropathy: Secondary | ICD-10-CM | POA: Diagnosis not present

## 2024-02-08 DIAGNOSIS — N183 Chronic kidney disease, stage 3 unspecified: Secondary | ICD-10-CM

## 2024-02-08 DIAGNOSIS — I130 Hypertensive heart and chronic kidney disease with heart failure and stage 1 through stage 4 chronic kidney disease, or unspecified chronic kidney disease: Secondary | ICD-10-CM | POA: Diagnosis not present

## 2024-02-08 DIAGNOSIS — I5020 Unspecified systolic (congestive) heart failure: Secondary | ICD-10-CM

## 2024-02-08 DIAGNOSIS — Z7984 Long term (current) use of oral hypoglycemic drugs: Secondary | ICD-10-CM

## 2024-02-08 DIAGNOSIS — Z6832 Body mass index (BMI) 32.0-32.9, adult: Secondary | ICD-10-CM

## 2024-02-08 DIAGNOSIS — I5022 Chronic systolic (congestive) heart failure: Secondary | ICD-10-CM

## 2024-02-08 DIAGNOSIS — E1122 Type 2 diabetes mellitus with diabetic chronic kidney disease: Secondary | ICD-10-CM | POA: Diagnosis not present

## 2024-02-08 DIAGNOSIS — E669 Obesity, unspecified: Secondary | ICD-10-CM

## 2024-02-08 DIAGNOSIS — E66811 Obesity, class 1: Secondary | ICD-10-CM

## 2024-02-08 LAB — POCT GLYCOSYLATED HEMOGLOBIN (HGB A1C): Hemoglobin A1C: 8.1 % — AB (ref 4.0–5.6)

## 2024-02-08 LAB — GLUCOSE, CAPILLARY: Glucose-Capillary: 160 mg/dL — ABNORMAL HIGH (ref 70–99)

## 2024-02-08 NOTE — Assessment & Plan Note (Signed)
 BMI 32.24. Counseled on lifestyle modification today.

## 2024-02-08 NOTE — Assessment & Plan Note (Signed)
 Continue ARB, SGLT2i. Also on MRA. HTN well managed, working on diabetes.  Plan -BMP, ACR today -Continue current medications

## 2024-02-08 NOTE — Assessment & Plan Note (Signed)
 A1c improved to 8.1%. Increased Jardiance to 25 mg at last visit. We had also discussed addition of metformin but Sarah Branch declined additional medications. Today, I have strongly encouraged her to make dietary changes as I think this could get her to goal if she wishes to avoid further pharmacologic agents. She asked great, pointed questions about specific choices and we reviewed appropriate options.  Plan -Goal A1c <8% -Empagliflozin 25 mg daily -Lifestyle modifications -F/u 3 months  Addendum 12/19 Called to discuss lab results with Sarah Branch and she expressed hesitation regarding her diabetes medications. She is not sure she wants to take two medications. We discussed the natural progression of diabetes and the additional benefits of SGLT2i for her history of CKD and HF. She did well on metformin in the past so I don't expect adverse effects but can address if these develop. We did discuss that she may try lifestyle changes which may help avoid the need for further medication. I encouraged her to try both Jardiance and metformin, as well as discussed the risks of poorly controlled diabetes, however I am not sure she will take both medications. Plan to f/u at next visit or sooner if she has further concerns.

## 2024-02-08 NOTE — Patient Instructions (Addendum)
 It was wonderful to see you today!  Nutrition for Diabetes -Increase fiber (>40-45 grams/day)  *Beans  *Whole grains (example: oatmeal, whole wheat, brown rice) *Vegetables and fruits  Look for the words "whole wheat" on your bread ingredients! Limit sugary drinks (soda, sweet tea, fruit juice, energy drinks), processed meats (bacon, sausage, deli meat), processed snacks (chips, pretzels, crackers), red meats (beef, pork, lamb), sweets (cakes, cookies, pastries). Even small changes can have big impacts, so stay positive!   *For exercise classes (in-person and online), group activities, and more*            [email or web address]  Please contact your pharmacy about scheduling the shingles (Shingrix), COVID, and tetanus (Tdap) vaccines.

## 2024-02-08 NOTE — Assessment & Plan Note (Signed)
 Blood pressure at goal today. No chest pain or dyspnea. No lower extremity edema. No changes today. Sarah Branch asked many good questions today about specific diet choices and we reviewed healthy options.  Plan -Continue carvedilol 12.5 mg bid, olmesartan 40 mg daily, spironolactone 25 mg daily, and amlodipine 5 mg daily -BMP -Counseled on dietary changes

## 2024-02-08 NOTE — Assessment & Plan Note (Signed)
Chronic and stable. Asymptomatic. Continue ARB, BB, MRA. Continue SGLT2i.

## 2024-02-08 NOTE — Progress Notes (Signed)
 Established Patient Office Visit  Subjective   Patient ID: Sarah Branch, female    DOB: [birth year]  Age: 78 y.o. MRN: [id number]  Chief Complaint  Patient presents with   Follow-up    Sarah Branch returns to clinic today for f/u of chronic medical conditions. Please see assessment/plan in problem-based charting for further details of today's visit.    Patient Active Problem List   Diagnosis Date Noted   Obesity with serious comorbidity 02/08/2024   Stage 3 chronic kidney disease (HCC) 02/09/2023   Healthcare maintenance 08/13/2020   HFmrEF (heart failure with mildly reduced ejection fraction) 07/23/2019   Irregular cardiac rhythm 06/07/2019   Atrial arrhythmia 07/28/2017   Sinus pause 05/06/2016   Breast cancer screening 07/19/2014   Diabetes mellitus with nephropathy (HCC) 02/28/2010   Hyperlipidemia 11/06/2006   Essential hypertension 11/06/2006      Objective:     BP 126/62 (BP Location: Right Arm, Patient Position: Sitting, Cuff Size: Normal)   Pulse 66   Temp 98 F (36.7 C) (Oral)   Ht 5\' 4"  (1.626 m)   Wt 187 lb 12.8 oz (85.2 kg)   LMP 01/14/1995   SpO2 100% Comment: RA  BMI 32.24 kg/m  BP Readings from Last 3 Encounters:  02/08/24 126/62  11/02/23 133/65  08/10/23 108/62   Wt Readings from Last 3 Encounters:  02/08/24 187 lb 12.8 oz (85.2 kg)  11/02/23 193 lb 6.4 oz (87.7 kg)  08/10/23 192 lb 4.8 oz (87.2 kg)    Physical Exam Constitutional:      Appearance: Normal appearance. She is not ill-appearing.  Pulmonary:     Effort: Pulmonary effort is normal.  Neurological:     General: No focal deficit present.     Mental Status: She is alert. Mental status is at baseline.  Psychiatric:        Mood and Affect: Mood normal.        Behavior: Behavior normal.       Assessment & Plan:   Problem List Items Addressed This Visit       Cardiovascular and Mediastinum   Essential hypertension (Chronic)   Blood pressure at goal today. No  chest pain or dyspnea. No lower extremity edema. No changes today. Sarah Branch asked many good questions today about specific diet choices and we reviewed healthy options.  Plan -Continue carvedilol 12.5 mg bid, olmesartan 40 mg daily, spironolactone 25 mg daily, and amlodipine 5 mg daily -BMP -Counseled on dietary changes      HFmrEF (heart failure with mildly reduced ejection fraction) (Chronic)   Chronic and stable. Asymptomatic. Continue ARB, BB, MRA. Continue SGLT2i.         Endocrine   Diabetes mellitus with nephropathy (HCC) - Primary   A1c improved to 8.1%. Increased Jardiance to 25 mg at last visit. We had also discussed addition of metformin but Sarah Branch declined additional medications. Today, I have strongly encouraged her to make dietary changes as I think this could get her to goal if she wishes to avoid further pharmacologic agents. She asked great, pointed questions about specific choices and we reviewed appropriate options.  Plan -Goal A1c <8% -Empagliflozin 25 mg daily -Lifestyle modifications -F/u 3 months  Addendum 12/19 Called to discuss lab results with Sarah Branch and she expressed hesitation regarding her diabetes medications. She is not sure she wants to take two medications. We discussed the natural progression of diabetes and the additional benefits of SGLT2i for her history of CKD  and HF. She did well on metformin in the past so I don't expect adverse effects but can address if these develop. We did discuss that she may try lifestyle changes which may help avoid the need for further medication. I encouraged her to try both Jardiance and metformin, as well as discussed the risks of poorly controlled diabetes, however I am not sure she will take both medications. Plan to f/u at next visit or sooner if she has further concerns.      Relevant Orders   POCT glycosylated hemoglobin (Hb A1C) (Completed)   Basic metabolic panel   Microalbumin / creatinine urine ratio      Genitourinary   Stage 3 chronic kidney disease (HCC)   Continue ARB, SGLT2i. Also on MRA. HTN well managed, working on diabetes.  Plan -BMP, ACR today -Continue current medications          Other   Obesity with serious comorbidity   BMI 32.24. Counseled on lifestyle modification today.       Return in about 3 months (around 05/10/2024).    Dickie La, MD

## 2024-02-09 LAB — BASIC METABOLIC PANEL
BUN/Creatinine Ratio: 15 (ref 12–28)
BUN: 17 mg/dL (ref 8–27)
CO2: 22 mmol/L (ref 20–29)
Calcium: 10.2 mg/dL (ref 8.7–10.3)
Chloride: 98 mmol/L (ref 96–106)
Creatinine, Ser: 1.12 mg/dL — ABNORMAL HIGH (ref 0.57–1.00)
Glucose: 174 mg/dL — ABNORMAL HIGH (ref 70–99)
Potassium: 4.4 mmol/L (ref 3.5–5.2)
Sodium: 135 mmol/L (ref 134–144)
eGFR: 50 mL/min/{1.73_m2} — ABNORMAL LOW (ref >59)

## 2024-02-10 LAB — MICROALBUMIN / CREATININE URINE RATIO
Creatinine, Urine: 43.8 mg/dL
Microalb/Creat Ratio: <7 mg/g{creat} (ref 0–29)
Microalbumin, Urine: <3 ug/mL

## 2024-02-14 NOTE — Progress Notes (Signed)
 Unable to reach Sarah Branch by telephone for results. Kidney function improved from last check and no evidence of proteinuria.

## 2024-03-22 ENCOUNTER — Encounter

## 2024-04-12 ENCOUNTER — Ambulatory Visit

## 2024-04-12 VITALS — Ht 64.0 in | Wt 187.0 lb

## 2024-04-12 DIAGNOSIS — Z0000 Encounter for general adult medical examination without abnormal findings: Secondary | ICD-10-CM

## 2024-04-12 NOTE — Patient Instructions (Signed)
 Sarah Branch , Thank you for taking time out of your busy schedule to complete your Annual Wellness Visit with me. I enjoyed our conversation and look forward to speaking with you again next year. I, as well as your care team,  appreciate your ongoing commitment to your health goals. Please review the following plan we discussed and let me know if I can assist you in the future. Your Game plan/ To Do List    Referrals: If you haven't heard from the office you've been referred to, please reach out to them at the phone provided.   Follow up Visits: Next Medicare AWV with our clinical staff: 04/18/2025 at 1:00 pm PHONE VISIT with Nurse Health Advisor   Have you seen your provider in the last 6 months (3 months if uncontrolled diabetes)? Yes Next Office Visit with your provider: 05/09/2024 at 10:15 am OFFICE VISIT  with Dr. Bevelyn Bryant for 3 month check up  Clinician Recommendations:  Aim for 30 minutes of exercise or brisk walking, 6-8 glasses of water, and 5 servings of fruits and vegetables each day.       This is a list of the screening recommended for you and due dates:  Health Maintenance  Topic Date Due   DTaP/Tdap/Td vaccine (1 - Tdap) Never done   Zoster (Shingles) Vaccine (1 of 2) Never done   COVID-19 Vaccine (3 - 2024-25 season) 07/18/2023   Complete foot exam   05/09/2024   Hemoglobin A1C  05/10/2024   Flu Shot  06/16/2024   Lipid (cholesterol) test  11/01/2024   Eye exam for diabetics  11/22/2024   Yearly kidney function blood test for diabetes  02/07/2025   Yearly kidney health urinalysis for diabetes  02/07/2025   Medicare Annual Wellness Visit  04/12/2025   Pneumonia Vaccine  Completed   DEXA scan (bone density measurement)  Completed   Hepatitis C Screening  Completed   HPV Vaccine  Aged Out   Meningitis B Vaccine  Aged Out   Colon Cancer Screening  Discontinued    Advanced directives: (Declined) Advance directive discussed with you today. Even though you declined this  today, please call our office should you change your mind, and we can give you the proper paperwork for you to fill out. Advance Care Planning is important because it:  [x]  Makes sure you receive the medical care that is consistent with your values, goals, and preferences  [x]  It provides guidance to your family and loved ones and reduces their decisional burden about whether or not they are making the right decisions based on your wishes.  Follow the link provided in your after visit summary or read over the paperwork we have mailed to you to help you started getting your Advance Directives in place. If you need assistance in completing these, please reach out to us  so that we can help you!  See attachments for Preventive Care and Fall Prevention Tips.

## 2024-04-12 NOTE — Progress Notes (Signed)
 Because this visit was a virtual/telehealth visit,  certain criteria was not obtained, such a blood pressure, CBG if applicable, and timed get up and go. Any medications not marked as "taking" were not mentioned during the medication reconciliation part of the visit. Any vitals not documented were not able to be obtained due to this being a telehealth visit or patient was unable to self-report a recent blood pressure reading due to a lack of equipment at home via telehealth. Vitals that have been documented are verbally provided by the patient.   Subjective:   Sarah Branch is a 78 y.o. who presents for a Medicare Wellness preventive visit.  As a reminder, Annual Wellness Visits don't include a physical exam, and some assessments may be limited, especially if this visit is performed virtually. We may recommend an in-person follow-up visit with your provider if needed.  Visit Complete: Virtual I connected with  Sarah Branch on 04/12/24 by a audio enabled telemedicine application and verified that I am speaking with the correct person using two identifiers.  Patient Location: Home  Provider Location: Office/Clinic  I discussed the limitations of evaluation and management by telemedicine. The patient expressed understanding and agreed to proceed.  Vital Signs: Because this visit was a virtual/telehealth visit, some criteria may be missing or patient reported. Any vitals not documented were not able to be obtained and vitals that have been documented are patient reported.  VideoDeclined- This patient declined Librarian, academic. Therefore the visit was completed with audio only.  Persons Participating in Visit: Patient.  AWV Questionnaire: No: Patient Medicare AWV questionnaire was not completed prior to this visit.  Cardiac Risk Factors include: advanced age (>49men, >63 women);sedentary lifestyle;hypertension;dyslipidemia     Objective:      Today's Vitals   04/12/24 1154  Weight: 187 lb (84.8 kg)  Height: 5\' 4"  (1.626 m)  PainSc: 4   PainLoc: Leg   Body mass index is 32.1 kg/m.     04/12/2024   11:57 AM 02/08/2024   10:30 AM 08/10/2023    9:48 AM 05/10/2023   10:29 AM 02/08/2023    2:05 PM 02/08/2023   10:05 AM 10/12/2022    9:55 AM  Advanced Directives  Does Patient Have a Medical Advance Directive? No No No No No No No  Would patient like information on creating a medical advance directive? No - Patient declined No - Patient declined No - Patient declined No - Patient declined No - Patient declined No - Patient declined No - Patient declined    Current Medications (verified) Outpatient Encounter Medications as of 04/12/2024  Medication Sig   amLODipine  (NORVASC ) 5 MG tablet Take 1 tablet (5 mg total) by mouth daily.   carvedilol  (COREG ) 12.5 MG tablet Take 1 tablet (12.5 mg total) by mouth 2 (two) times daily with a meal.   empagliflozin  (JARDIANCE ) 25 MG TABS tablet Take 1 tablet (25 mg total) by mouth daily.   loratadine  (EQ LORATADINE ) 10 MG tablet Take 1 tablet (10 mg total) by mouth daily.   metFORMIN  (GLUCOPHAGE ) 500 MG tablet Take 1 tablet by mouth daily for one week. Then take 1 tablet twice daily for one week. Then take 2 tablets in the morning and 1 tablet in the evening for one week. Then take 2 tablets twice daily.   olmesartan  (BENICAR ) 40 MG tablet Take 1 tablet (40 mg total) by mouth daily.   pravastatin  (PRAVACHOL ) 40 MG tablet TAKE 1 TABLET BY MOUTH  DAILY   spironolactone  (ALDACTONE ) 25 MG tablet TAKE 1 TABLET (25 MG TOTAL) BY MOUTH DAILY.   No facility-administered encounter medications on file as of 04/12/2024.    Allergies (verified) Patient has no known allergies.   History: Past Medical History:  Diagnosis Date   Anemia    -NOS- Positive stool guaiac cards, refuses colonoscopy   Colon cancer screening 04/05/2013   Constipation 08/10/2023   Dizziness 02/23/2018   Exposure to second hand  smoke    Hypercalcemia    NI SPEP, UPEP, PTH   Hypercalcemia 11/06/2006   2/2 HCTZ      Hyperlipidemia    Hypertension    Hypokalemia    2nd diuretics   Left shoulder pain 10/12/2022   Lower extremity edema 09/09/2021   Proteinuria    Severe hypertension 07/22/2019   Shoulder pain    Work Related   Superficial thrombophlebitis of left leg 12/27/2019   History reviewed. No pertinent surgical history. Family History  Problem Relation Age of Onset   Cancer Mother    Vision loss Father    Social History   Socioeconomic History   Marital status: Married    Spouse name: Not on file   Number of children: Not on file   Years of education: Not on file   Highest education level: Not on file  Occupational History   Not on file  Tobacco Use   Smoking status: Never   Smokeless tobacco: Never   Tobacco comments:    Husband smoked around for years  Substance and Sexual Activity   Alcohol use: No    Alcohol/week: 0.0 standard drinks of alcohol   Drug use: No   Sexual activity: Not on file  Other Topics Concern   Not on file  Social History Narrative   Financial assistance application initiated - further paperwork needed per [place] Chapman 02/25/2010   Wrked as Janitor, now retired.   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   No alcohol, drug or regular exercise   Never smoked, but husband has smoked around her for yrs.            Social Drivers of Corporate investment banker Strain: Low Risk  (04/12/2024)   Overall Financial Resource Strain (CARDIA)    Difficulty of Paying Living Expenses: Not hard at all  Food Insecurity: No Food Insecurity (04/12/2024)   Hunger Vital Sign    Worried About Running Out of Food in the Last Year: Never true    Ran Out of Food in the Last Year: Never true  Transportation Needs: No Transportation Needs (04/12/2024)   PRAPARE - Administrator, Civil Service (Medical): No    Lack of  Transportation (Non-Medical): No  Physical Activity: Inactive (04/12/2024)   Exercise Vital Sign    Days of Exercise per Week: 0 days    Minutes of Exercise per Session: 0 min  Stress: No Stress Concern Present (04/12/2024)   [employer] of Occupational Health - Occupational Stress Questionnaire    Feeling of Stress : Not at all  Social Connections: Socially Integrated (04/12/2024)   Social Connection and Isolation Panel [NHANES]    Frequency of Communication with Friends and Family: More than three times a week    Frequency of Social Gatherings with Friends and Family: More than three times a week    Attends Religious Services: More than 4 times per year  Active Member of Clubs or Organizations: Yes    Attends Engineer, structural: More than 4 times per year    Marital Status: Married    Tobacco Counseling Counseling given: Not Answered Tobacco comments: Husband smoked around for years    Clinical Intake:  Pre-visit preparation completed: Yes  Pain : 0-10 Pain Score: 4  (USES A TOPICAL CREAM FOR PAIN AT NIGHT) Pain Type: Acute pain Pain Location: Leg Pain Orientation: Left Pain Descriptors / Indicators: Aching     BMI - recorded: 32.1 Nutritional Status: BMI > 30  Obese Nutritional Risks: None Diabetes: Yes CBG done?: No Did pt. bring in CBG monitor from home?: No  Lab Results  Component Value Date   HGBA1C 8.1 (A) 02/08/2024   HGBA1C 8.8 (A) 11/02/2023   HGBA1C 8.5 (A) 08/10/2023     How often do you need to have someone help you when you read instructions, pamphlets, or other written materials from your doctor or pharmacy?: 1 - Never  Interpreter Needed?: No  Information entered by :: Druscilla Gerhard, LPN.   Activities of Daily Living     04/12/2024   11:58 AM 08/10/2023    9:47 AM  In your present state of health, do you have any difficulty performing the following activities:  Hearing? 0 0  Vision? 0 0  Difficulty concentrating or  making decisions? 0 0  Comment PT STATED; HAS GOOD RECALL   Walking or climbing stairs? 0 0  Dressing or bathing? 0 0  Doing errands, shopping? 0 0  Preparing Food and eating ? N   Using the Toilet? N   In the past six months, have you accidently leaked urine? N   Do you have problems with loss of bowel control? N   Managing your Medications? N   Managing your Finances? N   Housekeeping or managing your Housekeeping? N     Patient Care Team: Bevelyn Bryant, MD as PCP - General (Internal Medicine) Maris Sickle, MD as Referring Physician (Ophthalmology)  Indicate any recent Medical Services you may have received from other than [facility] providers in the past year (date may be approximate).     Assessment:    This is a routine wellness examination for [place].  Hearing/Vision screen Hearing Screening - Comments:: Denies hearing difficulties.  Vision Screening - Comments:: Wears rx glasses - up to date with routine eye exams with [facility]      Goals Addressed             This Visit's Progress    Stay independent and healthy.         Depression Screen     04/12/2024   12:00 PM 08/10/2023    9:47 AM 05/10/2023   10:29 AM 02/08/2023    2:04 PM 02/08/2023   10:04 AM 10/12/2022    9:53 AM 08/17/2022    9:57 AM  PHQ 2/9 Scores  PHQ - 2 Score 0 0 0 0 0 1 2  PHQ- 9 Score 0     2 8    Fall Risk     04/12/2024   11:56 AM 02/08/2024   10:29 AM 08/10/2023    9:47 AM 05/10/2023   10:29 AM 02/08/2023    2:04 PM  Fall Risk   Falls in the past year? 0 0 0 0 0  Number falls in past yr: 0 0 0 0 0  Injury with Fall? 0 0 0 0 0  Risk for fall due  to : No Fall Risks No Fall Risks No Fall Risks No Fall Risks No Fall Risks  Follow up Falls evaluation completed  Falls evaluation completed;Falls prevention discussed Falls evaluation completed;Falls prevention discussed Falls evaluation completed;Falls prevention discussed    MEDICARE RISK AT HOME:  Medicare Risk at Home Any stairs in  or around the home?: Yes (USES ELEVATOR) If so, are there any without handrails?: No Home free of loose throw rugs in walkways, pet beds, electrical cords, etc?: Yes Adequate lighting in your home to reduce risk of falls?: Yes Life alert?: No Use of a cane, walker or w/c?: No Grab bars in the bathroom?: Yes Shower chair or bench in shower?: Yes Elevated toilet seat or a handicapped toilet?: No  TIMED UP AND GO:  Was the test performed?  No  Cognitive Function: 6CIT completed    04/12/2024   12:00 PM  MMSE - Mini Mental State Exam  Not completed: Unable to complete        04/12/2024   12:08 PM  6CIT Screen  What Year? 0 points  What month? 0 points  What time? 0 points  Count back from 20 0 points  Months in reverse 0 points  Repeat phrase 0 points  Total Score 0 points    Immunizations Immunization History  Administered Date(s) Administered   Fluad Quad(high Dose 65+) 09/09/2021   Fluad Trivalent(High Dose 65+) 08/10/2023   Influenza,inj,Quad PF,6+ Mos 08/17/2022   PFIZER(Purple Top)SARS-COV-2 Vaccination 06/28/2020, 07/19/2020   PNEUMOCOCCAL CONJUGATE-20 10/15/2021    Screening Tests Health Maintenance  Topic Date Due   DTaP/Tdap/Td (1 - Tdap) Never done   Zoster Vaccines- Shingrix (1 of 2) Never done   COVID-19 Vaccine (3 - 2024-25 season) 07/18/2023   FOOT EXAM  05/09/2024   HEMOGLOBIN A1C  05/10/2024   INFLUENZA VACCINE  06/16/2024   LIPID PANEL  11/01/2024   OPHTHALMOLOGY EXAM  11/22/2024   Diabetic kidney evaluation - eGFR measurement  02/07/2025   Diabetic kidney evaluation - Urine ACR  02/07/2025   Medicare Annual Wellness (AWV)  04/12/2025   Pneumonia Vaccine 31+ Years old  Completed   DEXA SCAN  Completed   Hepatitis C Screening  Completed   HPV VACCINES  Aged Out   Meningococcal B Vaccine  Aged Out   Colonoscopy  Discontinued    Health Maintenance  Health Maintenance Due  Topic Date Due   DTaP/Tdap/Td (1 - Tdap) Never done   Zoster  Vaccines- Shingrix (1 of 2) Never done   COVID-19 Vaccine (3 - 2024-25 season) 07/18/2023   Health Maintenance Items Addressed: Yes Patient aware of current care gaps.  Immunization record was verified by [employer].  No new immunizations.  Patient is due Dtap, Covid and Shingrix.  Additional Screening:  Vision Screening: Recommended annual ophthalmology exams for early detection of glaucoma and other disorders of the eye.  Dental Screening: Recommended annual dental exams for proper oral hygiene  Community Resource Referral / Chronic Care Management: CRR required this visit?  No   CCM required this visit?  No   Plan:    I have personally reviewed and noted the following in the patient's chart:   Medical and social history Use of alcohol, tobacco or illicit drugs  Current medications and supplements including opioid prescriptions. Patient is not currently taking opioid prescriptions. Functional ability and status Nutritional status Physical activity Advanced directives List of other physicians Hospitalizations, surgeries, and ER visits in previous 12 months Vitals Screenings to include cognitive, depression,  and falls Referrals and appointments  In addition, I have reviewed and discussed with patient certain preventive protocols, quality metrics, and best practice recommendations. A written personalized care plan for preventive services as well as general preventive health recommendations were provided to patient.   Margette Sheldon, LPN   1/61/0960   After Visit Summary: (Declined) Due to this being a telephonic visit, with patients personalized plan was offered to patient but patient Declined AVS at this time   Notes: Patient aware of current care gaps.  Immunization record was verified by [employer].  No new immunizations.  Patient is due Dtap, Covid and Shingrix.

## 2024-05-09 ENCOUNTER — Ambulatory Visit: Admitting: Internal Medicine

## 2024-05-09 ENCOUNTER — Encounter: Payer: Self-pay | Admitting: Internal Medicine

## 2024-05-09 VITALS — BP 113/64 | HR 75 | Temp 98.2°F | Ht 64.0 in | Wt 181.4 lb

## 2024-05-09 DIAGNOSIS — E1121 Type 2 diabetes mellitus with diabetic nephropathy: Secondary | ICD-10-CM | POA: Diagnosis not present

## 2024-05-09 DIAGNOSIS — I10 Essential (primary) hypertension: Secondary | ICD-10-CM

## 2024-05-09 DIAGNOSIS — E66811 Obesity, class 1: Secondary | ICD-10-CM

## 2024-05-09 DIAGNOSIS — I110 Hypertensive heart disease with heart failure: Secondary | ICD-10-CM | POA: Diagnosis not present

## 2024-05-09 DIAGNOSIS — M25561 Pain in right knee: Secondary | ICD-10-CM | POA: Diagnosis not present

## 2024-05-09 DIAGNOSIS — G8929 Other chronic pain: Secondary | ICD-10-CM

## 2024-05-09 DIAGNOSIS — I5020 Unspecified systolic (congestive) heart failure: Secondary | ICD-10-CM

## 2024-05-09 DIAGNOSIS — Z6831 Body mass index (BMI) 31.0-31.9, adult: Secondary | ICD-10-CM

## 2024-05-09 DIAGNOSIS — E669 Obesity, unspecified: Secondary | ICD-10-CM

## 2024-05-09 DIAGNOSIS — Z7984 Long term (current) use of oral hypoglycemic drugs: Secondary | ICD-10-CM

## 2024-05-09 LAB — POCT GLYCOSYLATED HEMOGLOBIN (HGB A1C): Hemoglobin A1C: 8 % — AB (ref 4.0–5.6)

## 2024-05-09 LAB — GLUCOSE, CAPILLARY: Glucose-Capillary: 140 mg/dL — ABNORMAL HIGH (ref 70–99)

## 2024-05-09 MED ORDER — DICLOFENAC SODIUM 1 % EX GEL: 4.0000 g | Freq: Four times a day (QID) | CUTANEOUS | 1 refills | Status: AC

## 2024-05-09 NOTE — Assessment & Plan Note (Signed)
 No lower extremity edema. Asymptomatic. She is taking her olmesartan , carvedilol , empagliflozin , and spironolactone  as prescribed and tolerating these medications well.   Plan: continue medication regimen and plan to follow up in 3 months

## 2024-05-09 NOTE — Assessment & Plan Note (Signed)
 Patient A1C improved to 8.0%. She is taking Jardiance  25 mg as prescribed and tolerating this well. Patient would still prefer to keep to 1 medication and not take metformin . She is continuing to work on dietary changes and have more foods like black eyed peas. She is continuing to try to incorporate more vegetables into her diet as well. Her physical activity has been limited by the onset of right knee pain.   Plan:  Continue to work on lower A1c <8% Continue empagliflozin  25 mg daily Continue lifestyle modifications F/u 3 months

## 2024-05-09 NOTE — Assessment & Plan Note (Signed)
 Patient is down 6 pounds and BMI 31.14. She is eating healthier sandwiches and making dietary changes. Her exercise is limited due to knee pain.   Continue to encourage dietary modifications

## 2024-05-09 NOTE — Assessment & Plan Note (Addendum)
 She noticed in April or may the onset of right knee pain when she was lying in bed and extending both of her legs to stretch and heard a cracking sound in her right knee. Since then, she has had difficulty putting pressure on her right leg and localized pain to the medial aspect of the right knee. She feels the pain equally at all times of day. She denies hearing or feeling crepitance of the knees regularly. She tried a cream that her daughter gave her that starts with an H, but says that it has not helped. She has not tried any other medications for this pain. On physical exam there is localized swelling of the medial aspect of the right knee compared to the left knee, with limited extension of the right knee. Skin is not warm to touch or erythematous. Positive thessaly test of right knee. Medial right knee is tender to touch. Most likely etiology of pain given chronic nature of pain would be osteoarthritis of right knee or medial meniscal tear of right knee.   Plan: Patient recommended to use regular diclofenac gel and tylenol  1000 mg q8h for pain. NSAIDs not recommended with CKD. X ray ordered for further imaging of R knee. PT referral completed. Can consider knee injection or sports medicine referral if pain continues at follow up.

## 2024-05-09 NOTE — Assessment & Plan Note (Signed)
 BP 113/64 today. She is taking carvedilol  12.5 mg bid, olmesartan  40 mg daily, spironolactone  25 mg daily, amlodipine  5 mg daily and tolerating this regimen. She is trying to limit salt in her diet and make dietary modifications. She is aiming to minimize salt from canned beans and vegetables that she eats.   Continue carvedilol  12.5 mg bid, olmesartan  40 mg daily, spironolactone  25 mg daily, amlodipine  5 mg daily  F/u 3 months

## 2024-05-09 NOTE — Patient Instructions (Addendum)
 Sarah Branch,  Thank you for letting us  provide your care today!   We are putting in an order for your right knee xray, which you can complete at either [facility] or [facility] on [address]. You can walk into either [facility] or [facility] whenever convenient to complete this x ray. Additionally, we are sending in diclofenac gel for you to apply to your knee for pain. You may also take 1000 mg of tylenol  every 8 hours for pain. You should be receiving a call for physical therapy for your knee as well.   We would like to see you back in 3 months for a follow up.

## 2024-05-09 NOTE — Progress Notes (Signed)
 This is a Psychologist, occupational Note.  The care of the patient was discussed with Dr. Karna and the assessment and plan was formulated with their assistance.  Please see their note for official documentation of the patient encounter.   Subjective:   Patient ID: Sarah Branch female   DOB: [birth year] 78 y.o.   MRN: [id number]  HPI: Sarah Branch is a 78 y.o.   For the details of today's visit, please refer to the assessment and plan.    Past Medical History:  Diagnosis Date   Anemia    -NOS- Positive stool guaiac cards, refuses colonoscopy   Colon cancer screening 04/05/2013   Constipation 08/10/2023   Dizziness 02/23/2018   Exposure to second hand smoke    Hypercalcemia    NI SPEP, UPEP, PTH   Hypercalcemia 11/06/2006   2/2 HCTZ      Hyperlipidemia    Hypertension    Hypokalemia    2nd diuretics   Left shoulder pain 10/12/2022   Lower extremity edema 09/09/2021   Proteinuria    Severe hypertension 07/22/2019   Shoulder pain    Work Related   Superficial thrombophlebitis of left leg 12/27/2019   Current Outpatient Medications  Medication Sig Dispense Refill   diclofenac Sodium (VOLTAREN) 1 % GEL Apply 4 g topically 4 (four) times daily. 150 g 1   amLODipine  (NORVASC ) 5 MG tablet Take 1 tablet (5 mg total) by mouth daily. 90 tablet 3   carvedilol  (COREG ) 12.5 MG tablet Take 1 tablet (12.5 mg total) by mouth 2 (two) times daily with a meal. 180 tablet 3   empagliflozin  (JARDIANCE ) 25 MG TABS tablet Take 1 tablet (25 mg total) by mouth daily. 90 tablet 3   loratadine  (EQ LORATADINE ) 10 MG tablet Take 1 tablet (10 mg total) by mouth daily. 90 tablet 3   metFORMIN  (GLUCOPHAGE ) 500 MG tablet Take 1 tablet by mouth daily for one week. Then take 1 tablet twice daily for one week. Then take 2 tablets in the morning and 1 tablet in the evening for one week. Then take 2 tablets twice daily. 120 tablet 3   olmesartan  (BENICAR ) 40 MG tablet Take 1 tablet (40 mg total) by mouth  daily. 90 tablet 3   pravastatin  (PRAVACHOL ) 40 MG tablet TAKE 1 TABLET BY MOUTH DAILY 90 tablet 3   spironolactone  (ALDACTONE ) 25 MG tablet TAKE 1 TABLET (25 MG TOTAL) BY MOUTH DAILY. 90 tablet 3   No current facility-administered medications for this visit.    Review of Systems: Pertinent items are noted in HPI. Objective:  Physical Exam: Vitals:   05/09/24 1024  BP: 113/64  Pulse: 75  Temp: 98.2 F (36.8 C)  TempSrc: Oral  SpO2: 99%  Weight: 181 lb 6.4 oz (82.3 kg)  Height: 5' 4 (1.626 m)    Constitutional: NAD, appears comfortable Cardiovascular: RRR, no murmurs, rubs, or gallops.  Pulmonary/Chest: CTAB, no wheezes, rales, or rhonchi.  Extremities: No pitting edema of lower extremity. Localized area of swelling to right medial knee. No crepitance appreciated. Tenderness to touch on R medial knee. No erythema or warmth of knees. Limited extension of right knee. Positive thessaly test.  Skin: No rashes or erythema of right or left lower extremity Psychiatric: Normal mood and affect  Assessment & Plan:   Diabetes mellitus with nephropathy (HCC) Patient A1C improved to 8.0%. She is taking Jardiance  25 mg as prescribed and tolerating this well. Patient would still prefer to keep to 1 medication  and not take metformin . She is continuing to work on dietary changes and have more foods like black eyed peas. She is continuing to try to incorporate more vegetables into her diet as well. Her physical activity has been limited by the onset of right knee pain.   Plan:  Continue to work on lower A1c <8% Continue empagliflozin  25 mg daily Continue lifestyle modifications F/u 3 months  HFmrEF (heart failure with mildly reduced ejection fraction) No lower extremity edema. Asymptomatic. She is taking her olmesartan , carvedilol , empagliflozin , and spironolactone  as prescribed and tolerating these medications well.   Plan: continue medication regimen and plan to follow up in 3  months  Essential hypertension BP 113/64 today. She is taking carvedilol  12.5 mg bid, olmesartan  40 mg daily, spironolactone  25 mg daily, amlodipine  5 mg daily and tolerating this regimen. She is trying to limit salt in her diet and make dietary modifications. She is aiming to minimize salt from canned beans and vegetables that she eats.   Continue carvedilol  12.5 mg bid, olmesartan  40 mg daily, spironolactone  25 mg daily, amlodipine  5 mg daily  F/u 3 months  Obesity with serious comorbidity Patient is down 6 pounds and BMI 31.14. She is eating healthier sandwiches and making dietary changes. Her exercise is limited due to knee pain.   Continue to encourage dietary modifications  Chronic pain of right knee She noticed in April or may the onset of right knee pain when she was lying in bed and extending both of her legs to stretch and heard a cracking sound in her right knee. Since then, she has had difficulty putting pressure on her right leg and localized pain to the medial aspect of the right knee. She feels the pain equally at all times of day. She denies hearing or feeling crepitance of the knees regularly. She tried a cream that her daughter gave her that starts with an H, but says that it has not helped. She has not tried any other medications for this pain. On physical exam there is localized swelling of the medial aspect of the right knee compared to the left knee, with limited extension of the right knee. Skin is not warm to touch or erythematous. Positive thessaly test of right knee. Medial right knee is tender to touch. Most likely etiology of pain given chronic nature of pain would be osteoarthritis of right knee or medial meniscal tear of right knee.   Plan: Patient recommended to use regular diclofenac gel and tylenol  1000 mg q8h for pain. NSAIDs not recommended with CKD. X ray ordered for further imaging of R knee. PT referral completed. Can consider knee injection or sports medicine  referral if pain continues at follow up.

## 2024-08-18 ENCOUNTER — Other Ambulatory Visit: Payer: Self-pay | Admitting: Internal Medicine

## 2024-08-18 DIAGNOSIS — I10 Essential (primary) hypertension: Secondary | ICD-10-CM

## 2024-08-18 DIAGNOSIS — I5020 Unspecified systolic (congestive) heart failure: Secondary | ICD-10-CM

## 2024-08-18 NOTE — Telephone Encounter (Signed)
 Copied from CRM [id number]. Topic: Clinical - Medication Refill >> Aug 18, 2024 11:10 AM Susanna ORN wrote: Medication: carvedilol  (COREG ) 12.5 MG tablet  Has the patient contacted their pharmacy? Yes (Agent: If no, request that the patient contact the pharmacy for the refill. If patient does not wish to contact the pharmacy document the reason why and proceed with request.) (Agent: If yes, when and what did the pharmacy advise?)  This is the patient's preferred pharmacy:  [facility], [place] - [address] [place] [address] Phone: [phone] Fax: [phone]  Is this the correct pharmacy for this prescription? Yes If no, delete pharmacy and type the correct one.   Has the prescription been filled recently? No  Is the patient out of the medication? Yes  Has the patient been seen for an appointment in the last year OR does the patient have an upcoming appointment? Yes  Can we respond through MyChart? No  Agent: Please be advised that Rx refills may take up to 3 business days. We ask that you follow-up with your pharmacy.

## 2024-08-22 MED ORDER — CARVEDILOL 12.5 MG PO TABS: 12.5000 mg | ORAL_TABLET | Freq: Two times a day (BID) | ORAL | 3 refills | Status: AC

## 2024-09-04 ENCOUNTER — Other Ambulatory Visit: Payer: Self-pay

## 2024-09-04 ENCOUNTER — Ambulatory Visit: Payer: Self-pay

## 2024-09-04 VITALS — BP 136/63 | HR 71 | Temp 98.0°F | Ht 64.0 in | Wt 184.2 lb

## 2024-09-04 DIAGNOSIS — Z23 Encounter for immunization: Secondary | ICD-10-CM | POA: Diagnosis not present

## 2024-09-04 DIAGNOSIS — E1121 Type 2 diabetes mellitus with diabetic nephropathy: Secondary | ICD-10-CM | POA: Diagnosis not present

## 2024-09-04 DIAGNOSIS — E7800 Pure hypercholesterolemia, unspecified: Secondary | ICD-10-CM

## 2024-09-04 DIAGNOSIS — M25561 Pain in right knee: Secondary | ICD-10-CM

## 2024-09-04 DIAGNOSIS — I110 Hypertensive heart disease with heart failure: Secondary | ICD-10-CM | POA: Diagnosis not present

## 2024-09-04 DIAGNOSIS — E785 Hyperlipidemia, unspecified: Secondary | ICD-10-CM

## 2024-09-04 DIAGNOSIS — Z79899 Other long term (current) drug therapy: Secondary | ICD-10-CM

## 2024-09-04 DIAGNOSIS — I5020 Unspecified systolic (congestive) heart failure: Secondary | ICD-10-CM | POA: Diagnosis not present

## 2024-09-04 DIAGNOSIS — Z7984 Long term (current) use of oral hypoglycemic drugs: Secondary | ICD-10-CM

## 2024-09-04 DIAGNOSIS — I10 Essential (primary) hypertension: Secondary | ICD-10-CM

## 2024-09-04 DIAGNOSIS — G8929 Other chronic pain: Secondary | ICD-10-CM

## 2024-09-04 LAB — POCT GLYCOSYLATED HEMOGLOBIN (HGB A1C): HbA1c, POC (controlled diabetic range): 8.3 % — AB (ref 0.0–7.0)

## 2024-09-04 LAB — GLUCOSE, CAPILLARY: Glucose-Capillary: 167 mg/dL — ABNORMAL HIGH (ref 70–99)

## 2024-09-04 MED ORDER — EMPAGLIFLOZIN 25 MG PO TABS: 25.0000 mg | ORAL_TABLET | Freq: Every day | ORAL | 3 refills | Status: DC

## 2024-09-04 MED ORDER — OLMESARTAN MEDOXOMIL 40 MG PO TABS: 40.0000 mg | ORAL_TABLET | Freq: Every day | ORAL | 3 refills | Status: AC

## 2024-09-04 MED ORDER — AMLODIPINE BESYLATE 5 MG PO TABS: 5.0000 mg | ORAL_TABLET | Freq: Every day | ORAL | 3 refills | Status: DC

## 2024-09-04 MED ORDER — SPIRONOLACTONE 25 MG PO TABS: 25.0000 mg | ORAL_TABLET | Freq: Every day | ORAL | 3 refills | Status: DC

## 2024-09-04 NOTE — Assessment & Plan Note (Addendum)
 A1c checked today 8.3%. Last A1c 8.0% on 04/2024. Last uACR within goal 02/2024. Current regimen only includes Jardiace 25mg  as the patient prefers to stay on 1 medication and hesitant to be on multiple medications for her diabetes. Does not want to become hypoglycemic. Has been implementing lifestyle modifications since earlier this year but is limited in physical activity due to chronic knee pain. Also admits to occasional late night snack, but has been changing diet during majority of the days. Counseled on food habits and offered to send referral to diabetic educator for additional food education. Patient politely declined at this time. Will make conscious effort to refrain from excess snacking to reduce A1c.   -Recheck A1c in 3 months, consider adding Metformin  500mg  once daily to current Jardiance  regimen if A1c not equal to or less than 8.0%. - Continue Jardiance  25mg  daily

## 2024-09-04 NOTE — Assessment & Plan Note (Addendum)
 Last lipid panel 10/2023 showing LDL 85 and total cholesterol 162. Currently taking Pravastatin  40mg  daily. Will continue regimen. - Refill Pravastatin  40mg  daily. - Recheck lipid panel at next visit

## 2024-09-04 NOTE — Assessment & Plan Note (Addendum)
 Well-controlled. BP today 136/62. Regimen includes carvedilol  12.5mg  BID, olmesartan  40mg  daily, spironolactone  25mg  daily, and amlodipine  5mg  daily. Has implemented low-salt diet and lifestyle modifications within the past year.  - Continue current antihypertensives, sent refills to pharmacy today - Check BMP today. - Follow up in 3 months

## 2024-09-04 NOTE — Assessment & Plan Note (Addendum)
 Last echo 01/2019 with EF 45-50%. Current regimen includes Jardiance  25mg  daily, olmesartan , carvedilol , and spironolactone . Denies any SOB, lower extremity edema. Last seen by cardiology 05/2019. Euvolemic on exam. Will order updated echocardiogram. If improved to preserved EF, consider reconciliation of Spironolactone  from GDMT - 2D Echocardiogram - Continue GDMT

## 2024-09-04 NOTE — Progress Notes (Signed)
 Established Patient Office Visit  Subjective   Patient ID: Sarah Branch, female    DOB: [birth year]  Age: 78 y.o. MRN: [id number]  Chief Complaint  Patient presents with   Follow-up   Hypertension   Diabetes    Sarah Branch is a 78 year old female with a past medical history of type 2 diabetes mellitus complicated by nephropathy, HFmrEF (EF 45-50% echo 07/23/2019), and hypertension who presents today for a 72-month follow-up on her chronic conditions.  Please see problem-based assessment and plan below for details.    Review of Systems  Constitutional:  Negative for chills and fever.  Eyes:  Negative for blurred vision and double vision.  Respiratory:  Negative for cough.   Cardiovascular:  Negative for chest pain and palpitations.  Gastrointestinal:  Negative for abdominal pain, constipation, diarrhea, heartburn, nausea and vomiting.  Musculoskeletal:  Negative for myalgias.  Skin:  Negative for rash.  Neurological:  Negative for dizziness and headaches.      Objective:    BP 136/63 (BP Location: Right Arm, Patient Position: Sitting, Cuff Size: Large)   Pulse 71   Temp 98 F (36.7 C) (Oral)   Ht 5' 4 (1.626 m)   Wt 184 lb 3.2 oz (83.6 kg)   LMP 01/14/1995   SpO2 100%   BMI 31.62 kg/m   Physical Exam Constitutional:      Appearance: Normal appearance. She is normal weight.  HENT:     Mouth/Throat:     Mouth: Mucous membranes are moist.  Cardiovascular:     Rate and Rhythm: Normal rate and regular rhythm.     Pulses: Normal pulses.     Heart sounds: Normal heart sounds.  Pulmonary:     Effort: Pulmonary effort is normal.     Breath sounds: Normal breath sounds.  Abdominal:     General: Abdomen is flat. Bowel sounds are normal.     Palpations: Abdomen is soft.  Musculoskeletal:        General: Normal range of motion.  Skin:    General: Skin is warm.  Neurological:     General: No focal deficit present.     Mental Status: She is alert and oriented  to person, place, and time.  Psychiatric:        Mood and Affect: Mood normal.        Behavior: Behavior normal.      Results for orders placed or performed in visit on 09/04/24  Glucose, capillary  Result Value Ref Range   Glucose-Capillary 167 (H) 70 - 99 mg/dL  POC Hbg J8R  Result Value Ref Range   Hemoglobin A1C     HbA1c POC (<> result, manual entry)     HbA1c, POC (prediabetic range)     HbA1c, POC (controlled diabetic range) 8.3 (A) 0.0 - 7.0 %      The 10-year ASCVD risk score (Arnett DK, et al., 2019) is: 33.4%    Assessment & Plan:   Patient discussed with Dr. Mliss Pouch.  Problem List Items Addressed This Visit       Cardiovascular and Mediastinum   Essential hypertension (Chronic)   Well-controlled. BP today 136/62. Regimen includes carvedilol  12.5mg  BID, olmesartan  40mg  daily, spironolactone  25mg  daily, and amlodipine  5mg  daily. Has implemented low-salt diet and lifestyle modifications within the past year.  - Continue current antihypertensives, sent refills to pharmacy today - Check BMP today. - Follow up in 3 months      Relevant Medications   spironolactone  (ALDACTONE )  25 MG tablet   amLODipine  (NORVASC ) 5 MG tablet   olmesartan  (BENICAR ) 40 MG tablet   Other Relevant Orders   Basic metabolic panel with GFR   HFmrEF (heart failure with mildly reduced ejection fraction) (Chronic)   Last echo 01/2019 with EF 45-50%. Current regimen includes Jardiance  25mg  daily, olmesartan , carvedilol , and spironolactone . Denies any SOB, lower extremity edema. Last seen by cardiology 05/2019. Euvolemic on exam. Will order updated echocardiogram. If improved to preserved EF, consider reconciliation of Spironolactone  from GDMT - 2D Echocardiogram - Continue GDMT       Relevant Medications   spironolactone  (ALDACTONE ) 25 MG tablet   amLODipine  (NORVASC ) 5 MG tablet   olmesartan  (BENICAR ) 40 MG tablet   Other Relevant Orders   [facility] ECHO COMPLETE (BACK OFFICE)      Endocrine   Diabetes mellitus with nephropathy (HCC) - Primary (Chronic)   A1c checked today 8.3%. Last A1c 8.0% on 04/2024. Last uACR within goal 02/2024. Current regimen only includes Jardiace 25mg  as the patient prefers to stay on 1 medication and hesitant to be on multiple medications for her diabetes. Does not want to become hypoglycemic. Has been implementing lifestyle modifications since earlier this year but is limited in physical activity due to chronic knee pain. Also admits to occasional late night snack, but has been changing diet during majority of the days. Counseled on food habits and offered to send referral to diabetic educator for additional food education. Patient politely declined at this time. Will make conscious effort to refrain from excess snacking to reduce A1c.   -Recheck A1c in 3 months, consider adding Metformin  500mg  once daily to current Jardiance  regimen if A1c not equal to or less than 8.0%. - Continue Jardiance  25mg  daily      Relevant Medications   olmesartan  (BENICAR ) 40 MG tablet   empagliflozin  (JARDIANCE ) 25 MG TABS tablet   Other Relevant Orders   POC Hbg A1C (Completed)   Glucose, capillary (Completed)     Other   Hyperlipidemia (Chronic)   Last lipid panel 10/2023 showing LDL 85 and total cholesterol 162. Currently taking Pravastatin  40mg  daily. Will continue regimen. - Refill Pravastatin  40mg  daily. - Recheck lipid panel at next visit      Relevant Medications   spironolactone  (ALDACTONE ) 25 MG tablet   amLODipine  (NORVASC ) 5 MG tablet   olmesartan  (BENICAR ) 40 MG tablet   Chronic pain of right knee   Other Visit Diagnoses       Encounter for immunization       Relevant Orders   Flu vaccine HIGH DOSE PF(Fluzone Trivalent) (Completed)       Return in about 3 months (around 12/05/2024) for a1c recheck.     [name], DO Internal Medicine Resident, PGY-1 12:15 PM 09/04/2024

## 2024-09-04 NOTE — Assessment & Plan Note (Deleted)
 At last visit, patient was concerned about constant, localized pain to the medial aspect of the right knee. Noted to have mild swelling and tenderness over the medial joint line without laxity, suggesting possible OA vs medial meniscus injury. Patient was recommended and referred to PT, RICE therapy, and Tylenol  for pain, and ordered a R knee XR. She has not gotten the XR done and ___ gone to therapy.   -consider knee injection today with Dr. Trudy

## 2024-09-04 NOTE — Patient Instructions (Addendum)
 Thank you, [name] for allowing us  to provide your care today. Today we discussed your diabetes and your blood pressure.     I have ordered the following labs for you:  Lab Orders         Glucose, capillary         POC Hbg A1C       I will call if any are abnormal. All of your labs can be accessed through My Chart.   My Chart Access: [email or web address]  Please follow-up in: 3 months for A1c check     We look forward to seeing you next time. Please call our clinic at [phone] if you have any questions or concerns. The best time to call is Monday-Friday from 9am-4pm, but there is someone available 24/7. If after hours or the weekend, call the main hospital number and ask for the Internal Medicine Resident On-Call. If you need medication refills, please notify your pharmacy one week in advance and they will send us  a request.   Thank you for letting us  take part in your care. Wishing you the best!  Terrius Gentile, DO 09/04/2024, 11:22 AM [facility]

## 2024-09-05 ENCOUNTER — Ambulatory Visit: Payer: Self-pay

## 2024-09-05 LAB — BASIC METABOLIC PANEL WITH GFR
BUN/Creatinine Ratio: 18 (ref 12–28)
BUN: 24 mg/dL (ref 8–27)
CO2: 21 mmol/L (ref 20–29)
Calcium: 10.4 mg/dL — ABNORMAL HIGH (ref 8.7–10.3)
Chloride: 101 mmol/L (ref 96–106)
Creatinine, Ser: 1.35 mg/dL — ABNORMAL HIGH (ref 0.57–1.00)
Glucose: 150 mg/dL — ABNORMAL HIGH (ref 70–99)
Potassium: 4.5 mmol/L (ref 3.5–5.2)
Sodium: 137 mmol/L (ref 134–144)
eGFR: 40 mL/min/1.73 — ABNORMAL LOW (ref >59)

## 2024-09-05 NOTE — Progress Notes (Signed)
 Internal Medicine Clinic Attending  Case discussed with the resident at the time of the visit.  We reviewed the resident's history and exam and pertinent patient test results.  I agree with the assessment, diagnosis, and plan of care documented in the resident's note. Echo appropriate if goal is deprescribing of GDMT given patient's desire to minimize medications.  Agree with goal of A1c < 8.  Combo pills can be used in future if she continues to wish further med reduction.

## 2024-09-05 NOTE — Progress Notes (Signed)
 GFR decreased from 62mo ago, has been fluctuating from CKD3a-b for past year. Cr elevated 1.35, similar pattern to 10/2023. Patient remains on Jardiance , ARB, and MRA. Will repeat BMP in 3 months to monitor.

## 2024-09-07 NOTE — Progress Notes (Signed)
 Unable to LVM. Sent letter to home regarding lab results.

## 2024-11-28 ENCOUNTER — Other Ambulatory Visit: Payer: Self-pay | Admitting: Internal Medicine

## 2024-11-28 DIAGNOSIS — Z1231 Encounter for screening mammogram for malignant neoplasm of breast: Secondary | ICD-10-CM

## 2024-12-12 ENCOUNTER — Telehealth: Payer: Self-pay | Admitting: *Deleted

## 2024-12-12 DIAGNOSIS — E1121 Type 2 diabetes mellitus with diabetic nephropathy: Secondary | ICD-10-CM

## 2024-12-12 DIAGNOSIS — I10 Essential (primary) hypertension: Secondary | ICD-10-CM

## 2024-12-12 NOTE — Telephone Encounter (Signed)
 Pharmacy was closed unable to check on refills.  Patient does need a refill on her Jardiance .                                 Copied from CRM [id number]. Topic: Clinical - Medication Refill >> Dec 12, 2024 10:32 AM Susanna ORN wrote: Medication: amLODipine  (NORVASC ) 5 MG tablet, empagliflozin  (JARDIANCE ) 25 MG TABS tablet, & spironolactone  (ALDACTONE ) 25 MG tablet  Has the patient contacted their pharmacy? No (Agent: If no, request that the patient contact the pharmacy for the refill. If patient does not wish to contact the pharmacy document the reason why and proceed with request.) (Agent: If yes, when and what did the pharmacy advise?)  This is the patient's preferred pharmacy:  [facility] [address] [place], [place] - [address] [place] [address] Phone: [phone] Fax: [phone]  Is this the correct pharmacy for this prescription? Yes If no, delete pharmacy and type the correct one.   Has the prescription been filled recently? Yes  Is the patient out of the medication? Yes  Has the patient been seen for an appointment in the last year OR does the patient have an upcoming appointment? Yes  Can we respond through MyChart? No  Agent: Please be advised that Rx refills may take up to 3 business days. We ask that you follow-up with your pharmacy.

## 2024-12-14 MED ORDER — AMLODIPINE BESYLATE 5 MG PO TABS: 5.0000 mg | ORAL_TABLET | Freq: Every day | ORAL | 3 refills | Status: AC

## 2024-12-14 MED ORDER — EMPAGLIFLOZIN 25 MG PO TABS: 25.0000 mg | ORAL_TABLET | Freq: Every day | ORAL | 3 refills | Status: AC

## 2024-12-14 MED ORDER — SPIRONOLACTONE 25 MG PO TABS: 25.0000 mg | ORAL_TABLET | Freq: Every day | ORAL | 3 refills | Status: AC

## 2024-12-14 NOTE — Telephone Encounter (Unsigned)
 Copied from CRM [id number]. Topic: Clinical - Prescription Issue >> Dec 13, 2024 11:12 AM Farrel B wrote: Reason for CRM: Patient is needing to check the status of her prescription refill for amLODipine  (NORVASC ) 5 MG tablet, spironolactone  (ALDACTONE ) 25 MG tablet, empagliflozin  (JARDIANCE ) 25 MG TABS tablet  [facility] [address],  [address]  Phone: [phone] Fax: [phone] >> Dec 14, 2024  4:07 PM Debby BROCKS wrote: Patient is calling back to inquire if the medications can be sent over Callback [phone]

## 2025-01-01 ENCOUNTER — Ambulatory Visit

## 2025-04-18 ENCOUNTER — Ambulatory Visit
